# Patient Record
Sex: Female | Born: 1935 | Race: White | Hispanic: No | Smoking: Never smoker
Health system: Southern US, Community
[De-identification: ages and names within clinical notes are randomized; demographics above are authoritative.]

## PROBLEM LIST (undated history)

## (undated) ENCOUNTER — Emergency Department (HOSPITAL_COMMUNITY): Admission: EM | Payer: Medicare Other | Source: Home / Self Care

## (undated) DIAGNOSIS — D649 Anemia, unspecified: Secondary | ICD-10-CM

## (undated) DIAGNOSIS — K589 Irritable bowel syndrome without diarrhea: Secondary | ICD-10-CM

## (undated) DIAGNOSIS — R1032 Left lower quadrant pain: Secondary | ICD-10-CM

## (undated) DIAGNOSIS — J189 Pneumonia, unspecified organism: Secondary | ICD-10-CM

## (undated) DIAGNOSIS — IMO0001 Reserved for inherently not codable concepts without codable children: Secondary | ICD-10-CM

## (undated) DIAGNOSIS — I509 Heart failure, unspecified: Secondary | ICD-10-CM

## (undated) DIAGNOSIS — I10 Essential (primary) hypertension: Secondary | ICD-10-CM

## (undated) DIAGNOSIS — R0602 Shortness of breath: Secondary | ICD-10-CM

## (undated) DIAGNOSIS — I739 Peripheral vascular disease, unspecified: Secondary | ICD-10-CM

## (undated) DIAGNOSIS — K5730 Diverticulosis of large intestine without perforation or abscess without bleeding: Secondary | ICD-10-CM

## (undated) DIAGNOSIS — F419 Anxiety disorder, unspecified: Secondary | ICD-10-CM

## (undated) DIAGNOSIS — N390 Urinary tract infection, site not specified: Secondary | ICD-10-CM

## (undated) DIAGNOSIS — M1990 Unspecified osteoarthritis, unspecified site: Secondary | ICD-10-CM

## (undated) DIAGNOSIS — Z85038 Personal history of other malignant neoplasm of large intestine: Secondary | ICD-10-CM

## (undated) DIAGNOSIS — I2510 Atherosclerotic heart disease of native coronary artery without angina pectoris: Secondary | ICD-10-CM

## (undated) DIAGNOSIS — Z5189 Encounter for other specified aftercare: Secondary | ICD-10-CM

## (undated) DIAGNOSIS — K219 Gastro-esophageal reflux disease without esophagitis: Secondary | ICD-10-CM

## (undated) DIAGNOSIS — I209 Angina pectoris, unspecified: Secondary | ICD-10-CM

## (undated) DIAGNOSIS — E785 Hyperlipidemia, unspecified: Secondary | ICD-10-CM

## (undated) DIAGNOSIS — C801 Malignant (primary) neoplasm, unspecified: Secondary | ICD-10-CM

## (undated) HISTORY — DX: Essential (primary) hypertension: I10

## (undated) HISTORY — PX: APPENDECTOMY: SHX54

## (undated) HISTORY — DX: Hyperlipidemia, unspecified: E78.5

## (undated) HISTORY — DX: Irritable bowel syndrome, unspecified: K58.9

## (undated) HISTORY — PX: BILATERAL SALPINGOOPHORECTOMY: SHX1223

## (undated) HISTORY — DX: Left lower quadrant pain: R10.32

## (undated) HISTORY — DX: Diverticulosis of large intestine without perforation or abscess without bleeding: K57.30

## (undated) HISTORY — DX: Pneumonia, unspecified organism: J18.9

## (undated) HISTORY — DX: Atherosclerotic heart disease of native coronary artery without angina pectoris: I25.10

## (undated) HISTORY — DX: Personal history of other malignant neoplasm of large intestine: Z85.038

## (undated) HISTORY — DX: Urinary tract infection, site not specified: N39.0

## (undated) HISTORY — PX: BREAST SURGERY: SHX581

## (undated) HISTORY — PX: COLON SURGERY: SHX602

---

## 1987-04-12 HISTORY — PX: ANGIOPLASTY: SHX39

## 1989-04-11 HISTORY — PX: HEMICOLECTOMY: SHX854

## 1998-01-27 ENCOUNTER — Other Ambulatory Visit: Admission: RE | Admit: 1998-01-27 | Discharge: 1998-01-27 | Payer: Self-pay | Admitting: Oncology

## 1999-03-30 ENCOUNTER — Encounter: Payer: Self-pay | Admitting: Oncology

## 1999-03-30 ENCOUNTER — Encounter: Admission: RE | Admit: 1999-03-30 | Discharge: 1999-03-30 | Payer: Self-pay | Admitting: Oncology

## 1999-08-19 ENCOUNTER — Other Ambulatory Visit: Admission: RE | Admit: 1999-08-19 | Discharge: 1999-08-19 | Payer: Self-pay | Admitting: Internal Medicine

## 1999-08-19 ENCOUNTER — Encounter (INDEPENDENT_AMBULATORY_CARE_PROVIDER_SITE_OTHER): Payer: Self-pay | Admitting: Specialist

## 1999-11-18 ENCOUNTER — Encounter: Admission: RE | Admit: 1999-11-18 | Discharge: 1999-11-18 | Payer: Self-pay | Admitting: Otolaryngology

## 1999-11-18 ENCOUNTER — Encounter: Payer: Self-pay | Admitting: Otolaryngology

## 2000-02-01 ENCOUNTER — Other Ambulatory Visit: Admission: RE | Admit: 2000-02-01 | Discharge: 2000-02-01 | Payer: Self-pay | Admitting: Oncology

## 2000-03-31 ENCOUNTER — Encounter: Admission: RE | Admit: 2000-03-31 | Discharge: 2000-03-31 | Payer: Self-pay | Admitting: Oncology

## 2000-03-31 ENCOUNTER — Encounter: Payer: Self-pay | Admitting: Oncology

## 2001-03-29 ENCOUNTER — Encounter: Payer: Self-pay | Admitting: Oncology

## 2001-03-29 ENCOUNTER — Encounter: Admission: RE | Admit: 2001-03-29 | Discharge: 2001-03-29 | Payer: Self-pay | Admitting: Oncology

## 2002-06-20 ENCOUNTER — Other Ambulatory Visit: Admission: RE | Admit: 2002-06-20 | Discharge: 2002-06-20 | Payer: Self-pay | Admitting: *Deleted

## 2003-03-28 ENCOUNTER — Encounter: Admission: RE | Admit: 2003-03-28 | Discharge: 2003-03-28 | Payer: Self-pay | Admitting: Internal Medicine

## 2004-03-17 ENCOUNTER — Ambulatory Visit: Payer: Self-pay | Admitting: Internal Medicine

## 2004-03-19 ENCOUNTER — Ambulatory Visit: Payer: Self-pay | Admitting: Internal Medicine

## 2004-09-29 ENCOUNTER — Ambulatory Visit: Payer: Self-pay | Admitting: Internal Medicine

## 2004-10-28 ENCOUNTER — Ambulatory Visit: Payer: Self-pay | Admitting: Internal Medicine

## 2004-11-24 ENCOUNTER — Ambulatory Visit: Payer: Self-pay | Admitting: Internal Medicine

## 2004-11-24 LAB — HM COLONOSCOPY

## 2005-04-06 ENCOUNTER — Encounter: Admission: RE | Admit: 2005-04-06 | Discharge: 2005-04-06 | Payer: Self-pay | Admitting: Internal Medicine

## 2005-11-21 ENCOUNTER — Ambulatory Visit: Payer: Self-pay | Admitting: Internal Medicine

## 2005-12-29 ENCOUNTER — Ambulatory Visit: Payer: Self-pay | Admitting: Internal Medicine

## 2006-01-17 ENCOUNTER — Ambulatory Visit (HOSPITAL_COMMUNITY): Admission: RE | Admit: 2006-01-17 | Discharge: 2006-01-17 | Payer: Self-pay | Admitting: Orthopedic Surgery

## 2007-02-12 ENCOUNTER — Encounter: Payer: Self-pay | Admitting: Internal Medicine

## 2007-02-16 ENCOUNTER — Telehealth: Payer: Self-pay | Admitting: Internal Medicine

## 2007-03-19 ENCOUNTER — Encounter: Payer: Self-pay | Admitting: Internal Medicine

## 2007-04-06 ENCOUNTER — Encounter: Payer: Self-pay | Admitting: Internal Medicine

## 2007-04-06 ENCOUNTER — Encounter: Admission: RE | Admit: 2007-04-06 | Discharge: 2007-04-06 | Payer: Self-pay | Admitting: Internal Medicine

## 2007-04-06 LAB — HM MAMMOGRAPHY: HM Mammogram: NORMAL

## 2007-04-19 ENCOUNTER — Telehealth (INDEPENDENT_AMBULATORY_CARE_PROVIDER_SITE_OTHER): Payer: Self-pay | Admitting: *Deleted

## 2007-05-30 ENCOUNTER — Encounter: Payer: Self-pay | Admitting: Internal Medicine

## 2007-05-30 DIAGNOSIS — I10 Essential (primary) hypertension: Secondary | ICD-10-CM | POA: Insufficient documentation

## 2007-05-30 DIAGNOSIS — Z85038 Personal history of other malignant neoplasm of large intestine: Secondary | ICD-10-CM | POA: Insufficient documentation

## 2007-05-30 DIAGNOSIS — I2510 Atherosclerotic heart disease of native coronary artery without angina pectoris: Secondary | ICD-10-CM | POA: Insufficient documentation

## 2007-05-30 DIAGNOSIS — J309 Allergic rhinitis, unspecified: Secondary | ICD-10-CM | POA: Insufficient documentation

## 2007-05-30 DIAGNOSIS — J189 Pneumonia, unspecified organism: Secondary | ICD-10-CM

## 2007-05-30 DIAGNOSIS — E785 Hyperlipidemia, unspecified: Secondary | ICD-10-CM

## 2007-05-31 ENCOUNTER — Ambulatory Visit: Payer: Self-pay | Admitting: Internal Medicine

## 2008-04-29 ENCOUNTER — Encounter: Payer: Self-pay | Admitting: Internal Medicine

## 2008-05-13 ENCOUNTER — Telehealth: Payer: Self-pay | Admitting: Internal Medicine

## 2008-07-18 ENCOUNTER — Encounter: Payer: Self-pay | Admitting: Internal Medicine

## 2008-08-04 ENCOUNTER — Telehealth: Payer: Self-pay | Admitting: Internal Medicine

## 2008-08-05 ENCOUNTER — Ambulatory Visit: Payer: Self-pay | Admitting: Internal Medicine

## 2008-08-05 DIAGNOSIS — K589 Irritable bowel syndrome without diarrhea: Secondary | ICD-10-CM

## 2008-08-05 DIAGNOSIS — K5730 Diverticulosis of large intestine without perforation or abscess without bleeding: Secondary | ICD-10-CM | POA: Insufficient documentation

## 2008-08-06 LAB — CONVERTED CEMR LAB
Alkaline Phosphatase: 85 units/L (ref 39–117)
BUN: 12 mg/dL (ref 6–23)
Basophils Relative: 0.1 % (ref 0.0–3.0)
Calcium: 9.7 mg/dL (ref 8.4–10.5)
Creatinine, Ser: 1.1 mg/dL (ref 0.4–1.2)
Eosinophils Absolute: 0 10*3/uL (ref 0.0–0.7)
GFR calc non Af Amer: 50.08 mL/min (ref >60)
HCT: 34.4 % — ABNORMAL LOW (ref 36.0–46.0)
Hemoglobin: 12 g/dL (ref 12.0–15.0)
Monocytes Absolute: 0.5 10*3/uL (ref 0.1–1.0)
Monocytes Relative: 7 % (ref 3.0–12.0)
Neutro Abs: 5.7 10*3/uL (ref 1.4–7.7)
Neutrophils Relative %: 75.1 % (ref 43.0–77.0)
RBC: 3.77 M/uL — ABNORMAL LOW (ref 3.87–5.11)
Total Bilirubin: 1 mg/dL (ref 0.3–1.2)
Total Protein: 7.4 g/dL (ref 6.0–8.3)

## 2008-08-14 ENCOUNTER — Telehealth: Payer: Self-pay | Admitting: Internal Medicine

## 2008-10-07 ENCOUNTER — Telehealth: Payer: Self-pay | Admitting: Internal Medicine

## 2008-10-15 ENCOUNTER — Telehealth: Payer: Self-pay | Admitting: Internal Medicine

## 2008-10-16 ENCOUNTER — Telehealth: Payer: Self-pay | Admitting: Family Medicine

## 2008-10-16 ENCOUNTER — Telehealth: Payer: Self-pay | Admitting: Internal Medicine

## 2008-10-16 ENCOUNTER — Ambulatory Visit: Payer: Self-pay | Admitting: Internal Medicine

## 2008-10-16 DIAGNOSIS — N390 Urinary tract infection, site not specified: Secondary | ICD-10-CM | POA: Insufficient documentation

## 2008-10-16 LAB — CONVERTED CEMR LAB
Bilirubin Urine: NEGATIVE
Eosinophils Relative: 0.1 % (ref 0.0–5.0)
HCT: 33.5 % — ABNORMAL LOW (ref 36.0–46.0)
Hemoglobin: 11.5 g/dL — ABNORMAL LOW (ref 12.0–15.0)
Lymphs Abs: 1.3 10*3/uL (ref 0.7–4.0)
Monocytes Absolute: 0.3 10*3/uL (ref 0.1–1.0)
Monocytes Relative: 1.7 % — ABNORMAL LOW (ref 3.0–12.0)
Neutro Abs: 17.9 10*3/uL — ABNORMAL HIGH (ref 1.4–7.7)
Nitrite: POSITIVE
Total Protein, Urine: 100 mg/dL
Urobilinogen, UA: 1 (ref 0.0–1.0)

## 2008-10-17 ENCOUNTER — Encounter: Payer: Self-pay | Admitting: Internal Medicine

## 2008-10-17 ENCOUNTER — Telehealth: Payer: Self-pay | Admitting: Family Medicine

## 2008-10-18 ENCOUNTER — Ambulatory Visit: Payer: Self-pay | Admitting: Internal Medicine

## 2008-10-18 ENCOUNTER — Inpatient Hospital Stay (HOSPITAL_COMMUNITY): Admission: EM | Admit: 2008-10-18 | Discharge: 2008-10-20 | Payer: Self-pay | Admitting: Emergency Medicine

## 2008-10-21 ENCOUNTER — Telehealth: Payer: Self-pay | Admitting: Internal Medicine

## 2008-10-21 ENCOUNTER — Telehealth (INDEPENDENT_AMBULATORY_CARE_PROVIDER_SITE_OTHER): Payer: Self-pay | Admitting: *Deleted

## 2008-10-23 ENCOUNTER — Ambulatory Visit: Payer: Self-pay | Admitting: Internal Medicine

## 2008-10-31 ENCOUNTER — Telehealth: Payer: Self-pay | Admitting: Internal Medicine

## 2008-12-10 ENCOUNTER — Inpatient Hospital Stay (HOSPITAL_COMMUNITY): Admission: EM | Admit: 2008-12-10 | Discharge: 2008-12-15 | Payer: Self-pay | Admitting: Emergency Medicine

## 2008-12-18 ENCOUNTER — Telehealth: Payer: Self-pay | Admitting: Internal Medicine

## 2008-12-22 ENCOUNTER — Telehealth: Payer: Self-pay | Admitting: Internal Medicine

## 2009-01-02 ENCOUNTER — Telehealth: Payer: Self-pay | Admitting: Internal Medicine

## 2009-01-06 ENCOUNTER — Telehealth: Payer: Self-pay | Admitting: Internal Medicine

## 2009-01-23 ENCOUNTER — Encounter: Payer: Self-pay | Admitting: Internal Medicine

## 2009-01-27 ENCOUNTER — Telehealth: Payer: Self-pay | Admitting: Internal Medicine

## 2009-01-29 ENCOUNTER — Telehealth: Payer: Self-pay | Admitting: Internal Medicine

## 2009-02-03 ENCOUNTER — Telehealth: Payer: Self-pay | Admitting: Internal Medicine

## 2009-02-24 ENCOUNTER — Telehealth: Payer: Self-pay | Admitting: Internal Medicine

## 2009-03-03 ENCOUNTER — Telehealth: Payer: Self-pay | Admitting: Internal Medicine

## 2009-03-09 ENCOUNTER — Telehealth: Payer: Self-pay | Admitting: Internal Medicine

## 2009-03-10 ENCOUNTER — Encounter: Payer: Self-pay | Admitting: Internal Medicine

## 2009-06-05 ENCOUNTER — Telehealth: Payer: Self-pay | Admitting: Internal Medicine

## 2009-06-17 ENCOUNTER — Telehealth: Payer: Self-pay | Admitting: Internal Medicine

## 2009-06-18 ENCOUNTER — Telehealth: Payer: Self-pay | Admitting: Internal Medicine

## 2009-06-19 ENCOUNTER — Telehealth: Payer: Self-pay | Admitting: Internal Medicine

## 2009-06-22 ENCOUNTER — Telehealth: Payer: Self-pay | Admitting: Internal Medicine

## 2009-07-01 ENCOUNTER — Telehealth: Payer: Self-pay | Admitting: Internal Medicine

## 2009-07-15 ENCOUNTER — Telehealth: Payer: Self-pay | Admitting: Internal Medicine

## 2009-07-27 ENCOUNTER — Telehealth: Payer: Self-pay | Admitting: Internal Medicine

## 2009-07-29 ENCOUNTER — Telehealth: Payer: Self-pay | Admitting: Internal Medicine

## 2009-08-12 ENCOUNTER — Telehealth: Payer: Self-pay | Admitting: Internal Medicine

## 2009-08-27 ENCOUNTER — Telehealth: Payer: Self-pay | Admitting: Internal Medicine

## 2009-09-02 ENCOUNTER — Telehealth: Payer: Self-pay | Admitting: Internal Medicine

## 2009-09-03 ENCOUNTER — Telehealth: Payer: Self-pay | Admitting: Internal Medicine

## 2009-09-08 ENCOUNTER — Telehealth: Payer: Self-pay | Admitting: Internal Medicine

## 2009-09-30 ENCOUNTER — Encounter: Payer: Self-pay | Admitting: Internal Medicine

## 2009-10-29 ENCOUNTER — Telehealth: Payer: Self-pay | Admitting: Internal Medicine

## 2009-11-09 ENCOUNTER — Encounter (INDEPENDENT_AMBULATORY_CARE_PROVIDER_SITE_OTHER): Payer: Self-pay | Admitting: *Deleted

## 2009-12-10 HISTORY — PX: ORIF HIP FRACTURE: SHX2125

## 2010-03-17 ENCOUNTER — Telehealth: Payer: Self-pay | Admitting: Internal Medicine

## 2010-03-22 ENCOUNTER — Telehealth: Payer: Self-pay | Admitting: Internal Medicine

## 2010-03-25 ENCOUNTER — Telehealth: Payer: Self-pay | Admitting: Internal Medicine

## 2010-04-08 ENCOUNTER — Ambulatory Visit
Admission: RE | Admit: 2010-04-08 | Discharge: 2010-04-08 | Payer: Self-pay | Source: Home / Self Care | Attending: Internal Medicine | Admitting: Internal Medicine

## 2010-04-08 DIAGNOSIS — R112 Nausea with vomiting, unspecified: Secondary | ICD-10-CM | POA: Insufficient documentation

## 2010-04-08 LAB — CONVERTED CEMR LAB
Glucose, Urine, Semiquant: NEGATIVE
Nitrite: NEGATIVE
Protein, U semiquant: NEGATIVE

## 2010-04-26 ENCOUNTER — Telehealth: Payer: Self-pay | Admitting: Internal Medicine

## 2010-05-02 ENCOUNTER — Encounter: Payer: Self-pay | Admitting: Internal Medicine

## 2010-05-03 ENCOUNTER — Telehealth: Payer: Self-pay | Admitting: Internal Medicine

## 2010-05-06 ENCOUNTER — Telehealth: Payer: Self-pay | Admitting: Internal Medicine

## 2010-05-11 NOTE — Progress Notes (Signed)
Summary: Nexium samples   Phone Note Call from Patient Call back at Home Phone [phone]   Caller: Patient Call For: Dr. Leone Payor Reason for Call: Talk to Nurse Summary of Call: would like to pick up samples of Nexium on Monday Initial call taken by: Vallarie Mare,  June 19, 2009 9:04 AM  Follow-up for Phone Call        I spoke to pt and advised her we do not have any nexium samples.  Pt appreciative of me looking and calling her back. Follow-up by: Francee Piccolo CMA Duncan Dull),  June 19, 2009 9:15 AM

## 2010-05-11 NOTE — Progress Notes (Signed)
Summary: SAMPLES  Phone Note Call from Patient   Summary of Call: Patient is requesting a call.  Initial call taken by: Lamar Sprinkles, CMA,  July 29, 2009 10:40 AM  Follow-up for Phone Call        Pt informed to pick up Follow-up by: Lamar Sprinkles, CMA,  July 29, 2009 1:22 PM    Prescriptions: NEXIUM 40 MG CPDR (ESOMEPRAZOLE MAGNESIUM) Take 1 capsule by mouth once a day  #30 x 0   Entered by:   Lamar Sprinkles, CMA   Authorized by:   Jacques Navy MD   Signed by:   Lamar Sprinkles, CMA on 07/29/2009   Method used:   Samples Given   RxID:   [id number]

## 2010-05-11 NOTE — Progress Notes (Signed)
Summary: Medication Refill  Phone Note Refill Request Message from:  Fax from Pharmacy on Sep 08, 2009 1:45 PM  Refills Requested: Medication #1:  ATIVAN 0.5 MG TABS 1 at bedtime as needed  Medication #2:  TRIAMCINOLONE ACETONIDE 0.1 % CREA apply two times a day.   Dosage confirmed as above?Dosage Confirmed   Brand Name Necessary? No   Supply Requested: 1 month   Notes: Apply  twice a day to arms please Advise refill   Method Requested: Telephone to Pharmacy Next Appointment Scheduled: None Initial call taken by: Ami Bullins CMA,  Sep 08, 2009 1:46 PM  Follow-up for Phone Call        OK for refills x 2 months. Will need OV - last July '10 Follow-up by: Jacques Navy MD,  Sep 08, 2009 6:04 PM    Prescriptions: ATIVAN 0.5 MG TABS (LORAZEPAM) 1 at bedtime as needed  #30 x 1   Entered by:   Lamar Sprinkles, CMA   Authorized by:   Jacques Navy MD   Signed by:   Lamar Sprinkles, CMA on 09/09/2009   Method used:   Telephoned to ...       [facility] (retail)       [address]       [place], [place]  [address]       Ph: [phone]       Fax: [phone]   RxID:   [id number] TRIAMCINOLONE ACETONIDE 0.1 % CREA (TRIAMCINOLONE ACETONIDE) apply two times a day  #120 x 1   Entered by:   Lamar Sprinkles, CMA   Authorized by:   Jacques Navy MD   Signed by:   Lamar Sprinkles, CMA on 09/09/2009   Method used:   Telephoned to ...       [facility] (retail)       [address]       [place], [place]  [address]       Ph: [phone]       Fax: [phone]   RxID:   [id number]

## 2010-05-11 NOTE — Progress Notes (Signed)
Summary: SAMPLES  Phone Note Call from Patient   Summary of Call: Patient is requesting samples of nexium.  Initial call taken by: Lamar Sprinkles, CMA,  Sep 03, 2009 11:28 AM  Follow-up for Phone Call        Patient notified and will pick up.Marland KitchenMarland KitchenAlvy Beal Archie CMA  Sep 03, 2009 11:33 AM

## 2010-05-11 NOTE — Letter (Signed)
Summary: [facility]   Imported By: Sherian Rein 10/07/2009 09:35:13  _____________________________________________________________________  External Attachment:    Type:   Image     Comment:   External Document

## 2010-05-11 NOTE — Progress Notes (Signed)
Summary: Sooner appt.   Phone Note Call from Patient Call back at [facility] Phone [phone]   Caller: Patient Call For: Dr. Leone Payor Reason for Call: Talk to Nurse Summary of Call: Pt has an appt. on 07-06-09 and wants a sooner appt. She said she has "alot of fluid backup" Initial call taken by: Karna Christmas,  June 22, 2009 8:08 AM  Follow-up for Phone Call        Patient  can't come to her appointment on 07-06-09 at 2:30, due to a ride, she wants to reschedule for an am.  I explained that Dr Leone Payor doesn't have any am appointments until 07-20-09.  She will continue to try and find a ride and call back if she wants to reschedule. Follow-up by: Darcey Nora RN, CGRN,  June 22, 2009 8:37 AM

## 2010-05-11 NOTE — Progress Notes (Signed)
Summary: Triamcinolone Cream Refill  Phone Note Refill Request Message from:  Fax from Pharmacy on Sep 02, 2009 2:21 PM  Refills Requested: Medication #1:  TRIAMCINOLONE ACETON 0.1% TOPICAL CREAM   Last Refilled: 09/19/2006 QTY 120 GM APPLY TWO TIMES A DAY TO [facility]   Method Requested: Electronic Next Appointment Scheduled:  NONE Initial call taken by: Glendell Docker CMA,  Sep 02, 2009 2:28 PM  Follow-up for Phone Call        OK to refill x 2  Follow-up by: Jacques Navy MD,  Sep 03, 2009 5:58 PM    New/Updated Medications: TRIAMCINOLONE ACETONIDE 0.1 % CREA (TRIAMCINOLONE ACETONIDE) apply two times a day Prescriptions: TRIAMCINOLONE ACETONIDE 0.1 % CREA (TRIAMCINOLONE ACETONIDE) apply two times a day  #120 x 2   Entered by:   Lamar Sprinkles, CMA   Authorized by:   Jacques Navy MD   Signed by:   Lamar Sprinkles, CMA on 09/03/2009   Method used:   Faxed to ...       [facility] (retail)       [address]       [place], [place]  [address]       Ph: [phone]       Fax: [phone]   RxID:   [id number]

## 2010-05-11 NOTE — Progress Notes (Signed)
Summary: Nexium samples  Phone Note Call from Patient Call back at Home Phone [phone]   Caller: Patient Call For: Leone Payor Reason for Call: Talk to Nurse Summary of Call: Patient wants Nexium samples Initial call taken by: Tawni Levy,  July 27, 2009 10:48 AM  Follow-up for Phone Call        [facility] from pt.  Advised pt we do not have any samples.  I also explained to pt again that we do not get a lot of samples and those are given to our patients in the office and therefore frequently used rather quickly.  Pt is upset we do not have samples for "old patients".  I explained to her that we do have samples for established patients, but we are not receiving enough samples to keep a supply in our office and frequently run out. Follow-up by: Francee Piccolo CMA Duncan Dull),  July 29, 2009 9:41 AM

## 2010-05-11 NOTE — Progress Notes (Signed)
  Phone Note Refill Request   Refills Requested: Medication #1:  NEXIUM 40 MG CPDR Take 1 capsule by mouth once a day Initial call taken by: Rock Nephew CMA,  Aug 27, 2009 11:00 AM    Prescriptions: NEXIUM 40 MG CPDR (ESOMEPRAZOLE MAGNESIUM) Take 1 capsule by mouth once a day  #60 x 5   Entered by:   Rock Nephew CMA   Authorized by:   Jacques Navy MD   Signed by:   Rock Nephew CMA on 08/27/2009   Method used:   Faxed to ...       [facility] (retail)       [address]       [place], [place]  [address]       Ph: [phone]       Fax: [phone]   RxID:   [id number]

## 2010-05-11 NOTE — Progress Notes (Signed)
Summary: Nexium samples   Phone Note Call from Patient Call back at Home Phone [phone]   Caller: Patient Call For: Dr. Leone Payor Reason for Call: Talk to Nurse Summary of Call: Pt wants to know if we have any samples of Nexium? Initial call taken by: Karna Christmas,  June 05, 2009 9:34 AM  Follow-up for Phone Call        advised pt no samples and next shipment will not arrive for another 10-14 days.  Advised she may call back in about 2 weeks to see if we have samples.  Pt agreeable. Follow-up by: Francee Piccolo CMA Duncan Dull),  June 05, 2009 4:40 PM

## 2010-05-11 NOTE — Letter (Signed)
Summary: Colonoscopy-Changed to Office Visit Letter  [facility]  [address] [place], [place] [address]   Phone: [phone]  Fax: [phone]      November 09, 2009 MRN: [id number]   [facility] [address] [place], [place]  [address]   Dear Ms. [facility],   According to our records, it is time for you to schedule a Colonoscopy. However, after reviewing your medical record, I feel that an office visit would be most appropriate to more completely evaluate you and determine your need for a repeat procedure.  Please call [phone] (option #2) at your convenience to schedule an office visit. If you have any questions, concerns, or feel that this letter is in error, we would appreciate your call.   Sincerely,   Iva Boop, M.D.  [facility]astro[facility] [phone]

## 2010-05-11 NOTE — Progress Notes (Signed)
  Phone Note Refill Request Message from:  Fax from Pharmacy on March 17, 2010 1:30 PM  Refills Requested: Medication #1:  NEXIUM 40 MG CPDR Take 1 capsule by mouth once a day Initial call taken by: Ami Bullins CMA,  March 17, 2010 1:30 PM    Prescriptions: NEXIUM 40 MG CPDR (ESOMEPRAZOLE MAGNESIUM) Take 1 capsule by mouth once a day  #60 x 5   Entered by:   Ami Bullins CMA   Authorized by:   Jacques Navy MD   Signed by:   Bill Salinas CMA on 03/17/2010   Method used:   Faxed to ...       [facility] (retail)       [address]       [place], [place]  [address]       Ph: [phone]       Fax: [phone]   RxID:   [id number]

## 2010-05-11 NOTE — Progress Notes (Signed)
Summary: REFILL -Lorazepam  Phone Note Refill Request   Refills Requested: Medication #1:  ATIVAN 0.5 MG TABS 1 at bedtime as needed Initial call taken by: Lamar Sprinkles, CMA,  October 29, 2009 10:27 AM  Follow-up for Phone Call        ok x 5 Follow-up by: Jacques Navy MD,  October 29, 2009 4:15 PM    Prescriptions: ATIVAN 0.5 MG TABS (LORAZEPAM) 1 at bedtime as needed  #30 x 5   Entered by:   Lamar Sprinkles, CMA   Authorized by:   Jacques Navy MD   Signed by:   Lamar Sprinkles, CMA on 10/29/2009   Method used:   Telephoned to ...       [facility] (retail)       [address]       [place], [place]  [address]       Ph: [phone]       Fax: [phone]   RxID:   [id number]

## 2010-05-11 NOTE — Progress Notes (Signed)
Summary: Promethazine refill  Phone Note Call from Patient Call back at Home Phone [phone]   Caller: Patient Call For: Dr. Leone Payor Reason for Call: Talk to Nurse Summary of Call: pharmacy needs auth for Promethazine... [facility], [phone] Initial call taken by: Vallarie Mare,  July 15, 2009 1:31 PM  Follow-up for Phone Call        I spoke to pt.  She states that she is not having an increase in her nausea. She will only have nausea 2-3 times every 2 months or so.  She "just thought I would need in my medicine cabinet".  Is it OK to refill?  Last refilled on 12/19/08 #30x0. Follow-up by: Francee Piccolo CMA Duncan Dull),  July 15, 2009 2:41 PM  Additional Follow-up for Phone Call Additional follow up Details #1::        yes same sig and # I did it    Prescriptions: PROMETHAZINE HCL 25 MG TABS (PROMETHAZINE HCL) 1/2- 1 by mouth q 8 hours as needed nausea  #30 x 0   Entered and Authorized by:   Iva Boop MD, [facility]   Signed by:   Iva Boop MD, FACG on 07/15/2009   Method used:   Printed then faxed to ...       [facility] (retail)       [address]       [place], [place]  [address]       Ph: [phone]       Fax: [phone]   RxID:   [id number]

## 2010-05-11 NOTE — Progress Notes (Signed)
Summary: Real important   Phone Note Call from Patient Call back at Home Phone [phone]   Call For: Dr Leone Payor Reason for Call: Talk to Nurse Summary of Call: Really important needs to tak to nurse now. Initial call taken by: Leanor Kail [facility],  June 18, 2009 8:05 AM  Follow-up for Phone Call        patient has decided she does want an appointment with Dr Leone Payor she is scheduled for 07-06-09 2:30 Follow-up by: Darcey Nora RN, CGRN,  June 18, 2009 8:27 AM

## 2010-05-11 NOTE — Progress Notes (Signed)
Summary: Nexium samples  Phone Note Call from Patient Call back at Home Phone [phone]   Summary of Call: Patient was last seen 10/2008. Is it ok to provide Nexium samples? Initial call taken by: Lucious Groves,  Aug 12, 2009 1:14 PM  Follow-up for Phone Call        Can have them if we have them. If no samples may take omeprazole 40mg  by mouth q AM; #30, refill prn Follow-up by: Jacques Navy MD,  Aug 12, 2009 3:21 PM  Additional Follow-up for Phone Call Additional follow up Details #1::        tried to call home number several times. Number busy Additional Follow-up by: Ami Bullins CMA,  Aug 13, 2009 4:39 PM    Additional Follow-up for Phone Call Additional follow up Details #2::    pt informed, 30 day supply of samples in front cabinet for pt pick up Follow-up by: Margaret Pyle, CMA,  Aug 14, 2009 9:27 AM

## 2010-05-11 NOTE — Progress Notes (Signed)
Summary: Triage   Phone Note Call from Patient Call back at Home Phone [phone]   Caller: Patient Call For: Dr. Leone Payor Reason for Call: Talk to Nurse Summary of Call: Pt is swollen and feel like she is "retaining fluid" Initial call taken by: Karna Christmas,  June 17, 2009 9:02 AM  Follow-up for Phone Call         Pt. started having abdominal bloating 2 weeks ago and it has not gone away.Says abd. is soft and denies constipation. Says oral intake does not affect the bloating it has stayed the same. Had soft formed stool today.Asking for medication  for the bloating. Follow-up by: Teryl Lucy RN,  June 17, 2009 10:37 AM  Additional Follow-up for Phone Call Additional follow up Details #1::        She needs to see me or an extender about this before any Rx Additional Follow-up by: Iva Boop MD, Clementeen Graham,  June 17, 2009 1:45 PM    Additional Follow-up for Phone Call Additional follow up Details #2::    Patient notified that she will need an appointment , she declines to schedule and will call back if she is interested in scheduling.  She reports she has to see her cardiologist soon and will call back   Follow-up by: Darcey Nora RN, CGRN,  June 17, 2009 2:26 PM

## 2010-05-11 NOTE — Progress Notes (Signed)
Summary: Nexium samples   Phone Note Call from Patient Call back at Home Phone [phone]   Caller: Patient Call For: Dr. Leone Payor Reason for Call: Talk to Nurse Summary of Call: pt would like samples of Nexium Initial call taken by: Vallarie Mare,  July 01, 2009 12:07 PM  Follow-up for Phone Call        2nd call to pt.  Line busy. Francee Piccolo CMA Duncan Dull)  July 02, 2009 3:58 PM  notified pt we do not have any nexium samples. Follow-up by: Francee Piccolo CMA Duncan Dull),  July 06, 2009 2:01 PM

## 2010-05-13 NOTE — Progress Notes (Signed)
Summary: NEXIUM QTY ?  Phone Note Call from Patient   Summary of Call: Pt left vm @ 9:42am today. Wants to know why we didn't send rx for nexium in w/QTY #60. Need to call pt - is she taking med two times a day?  Initial call taken by: Lamar Sprinkles, CMA,  March 22, 2010 10:46 AM  Follow-up for Phone Call        Pt did not leave call back #. Hm # in system says changed to nonpublished #.  Follow-up by: Lamar Sprinkles, CMA,  March 22, 2010 12:01 PM  Additional Follow-up for Phone Call Additional follow up Details #1::        Called pharm - new phone # is [phone]. Called pt, she takes nexium 1 two times a day. Ok to update EMR and send in new rx?  Additional Follow-up by: Lamar Sprinkles, CMA,  March 23, 2010 12:12 PM    Additional Follow-up for Phone Call Additional follow up Details #2::    Patient not seen for a while. Reviewed office records and hospital d/c. Alway lists nexium as once a day. No endoscopy reports and no indication in Dr. Marvell Fuller notes that there is a reason fro twice a day dosing. It is generally a once a day medication.  OK to issue 60 for two times a day dosing until she can be seen in the office for a followup appointment either with me or Dr. Leone Payor.   Thanks Follow-up by: Jacques Navy MD,  March 23, 2010 12:44 PM  Additional Follow-up for Phone Call Additional follow up Details #3:: Details for Additional Follow-up Action Taken: ok we will have her conme in in January Iva Boop MD, [facility]  March 23, 2010 1:53 PM  Called and spoke with patient and tried to schedule her for REV in January. Patient states that she "does not trust Dr. Leone Payor." Patient states that all he does is bring her back in for visits and charge co-pays. Informed patient that we were trying to get her in based on Dr. Alvera Novel note. Patient states that she will use Dr. Debby Bud for all of her GI care. Patient wanted to make sure we called in quantitiy of 60 for her Nexium.  Again informed patient that we were trying to bring her in to take care of her medications and that we would not call in the Nexium. Dr. Debby Bud states that he refilled it with quantity of 60 until patient could be seen for visit with either Leone Payor or Dr. Debby Bud. Patient aware and states she will see Norins. Let patient know I would note this in her chart. Additional Follow-up by: Selinda Michaels RN,  March 23, 2010 3:16 PM  Conversation noted. Will not reschedule her without GI MD (me approval) though if she truly does not trust me we do not have an effective physician-patient relationship and would dismiss from practice. I think she is frustrated because we would ot dispense samples or prescribe medications without some ongoing follow-up.  will see what happens when she sees Dr. Debby Bud and remain available with these limitations for now. Iva Boop MD, [facility]  March 23, 2010 3:43 PM

## 2010-05-13 NOTE — Progress Notes (Signed)
Summary: REFILL  Phone Note Call from Patient Call back at Home Phone [phone]   Summary of Call: Patient is requesting a call - has a question.  Initial call taken by: Lamar Sprinkles, CMA,  May 03, 2010 2:34 PM  Follow-up for Phone Call        # busy.......................Marland KitchenLamar Sprinkles, CMA  May 03, 2010 5:18 PM   Pt req refill of mouthwash. It is chlorhexidine mouthwash. She uses it as needed for sore gums. OK?  Follow-up by: Lamar Sprinkles, CMA,  May 05, 2010 10:39 AM  Additional Follow-up for Phone Call Additional follow up Details #1::        ok to refill this - 1 pt, refill as needed, Additional Follow-up by: Jacques Navy MD,  May 05, 2010 12:59 PM    Additional Follow-up for Phone Call Additional follow up Details #2::    Pt informed  Follow-up by: Lamar Sprinkles, CMA,  May 05, 2010 1:15 PM  New/Updated Medications: CHLORHEXIDINE GLUCONATE  SOLN (CHLORHEXIDINE GLUCONATE) use mouthwash as directed for sore gums Prescriptions: CHLORHEXIDINE GLUCONATE  SOLN (CHLORHEXIDINE GLUCONATE) use mouthwash as directed for sore gums  #1 pt x 12   Entered by:   Lamar Sprinkles, CMA   Authorized by:   Jacques Navy MD   Signed by:   Lamar Sprinkles, CMA on 05/05/2010   Method used:   Electronically to        [facility] (retail)       [address]       [place], [place]  [address]       Ph: [phone]       Fax: [phone]   RxID:   [id number]

## 2010-05-13 NOTE — Progress Notes (Signed)
Summary: UTI?   Phone Note Call from Patient Call back at [facility] Phone [phone]   Summary of Call: Pt recently was treated for uti. She has blood in her urine again and req rx for antibiotic.  Initial call taken by: Lamar Sprinkles, CMA,  April 26, 2010 9:26 AM  Follow-up for Phone Call        If possible she needs to come by for U/A and urine culture before starting another round of anti biotics.   Rx - generic Septra DS two times a day x 7 days.  Follow-up by: Jacques Navy MD,  April 26, 2010 9:53 AM  Additional Follow-up for Phone Call Additional follow up Details #1::        # [phone] is not in service.............Marland KitchenLamar Sprinkles, CMA  April 26, 2010 2:07 PM   # changed in EMR. spoke w/pt, her car is not working and she has no ability to come in and her pharmacy delivers. Advised she come in if able, rx sent to pharmacy. Additional Follow-up by: Lamar Sprinkles, CMA,  April 26, 2010 5:55 PM    Additional Follow-up for Phone Call Additional follow up Details #2::    OK.  Follow-up by: Jacques Navy MD,  April 26, 2010 6:40 PM  New/Updated Medications: SEPTRA DS 800-160 MG TABS (SULFAMETHOXAZOLE-TRIMETHOPRIM) 1 two times a day x 7 days Prescriptions: SEPTRA DS 800-160 MG TABS (SULFAMETHOXAZOLE-TRIMETHOPRIM) 1 two times a day x 7 days  #14 x 0   Entered by:   Lamar Sprinkles, CMA   Authorized by:   Jacques Navy MD   Signed by:   Lamar Sprinkles, CMA on 04/26/2010   Method used:   Electronically to        [facility] (retail)       [address]       [place], [place]  [address]       Ph: [phone]       Fax: [phone]   RxID:   [id number]

## 2010-05-13 NOTE — Progress Notes (Signed)
Summary: samples  Phone Note Call from Patient   Summary of Call: Patient is requesting nexium samples. She is out and needs more to hold her until she can afford to purchase.  Initial call taken by: Lamar Sprinkles, CMA,  May 06, 2010 10:10 AM  Follow-up for Phone Call        pts samples up front unable to reach pt Follow-up by: Ami Bullins CMA,  May 06, 2010 2:06 PM  Additional Follow-up for Phone Call Additional follow up Details #1::        informed pt that samples are up front for pick up Additional Follow-up by: Ami Bullins CMA,  May 06, 2010 6:23 PM    Prescriptions: NEXIUM 40 MG CPDR (ESOMEPRAZOLE MAGNESIUM) Take 1 capsule by mouth once a day  #25 x 0   Entered by:   Ami Bullins CMA   Authorized by:   Jacques Navy MD   Signed by:   Bill Salinas CMA on 05/06/2010   Method used:   Samples Given   RxID:   [id number]

## 2010-05-13 NOTE — Assessment & Plan Note (Signed)
Summary: BLOOD IN URINE/ [facility]   Vital Signs:  Patient profile:   77 year old female Height:      65 inches Weight:      115 pounds BMI:     19.21 O2 Sat:      98 % on Room air Temp:     97.8 degrees F oral Pulse rate:   112 / minute BP sitting:   140 / 72  (left arm) Cuff size:   regular  Vitals Entered By: Bill Salinas CMA (April 08, 2010 8:58 AM)  O2 Flow:  Room air CC: pt here with c/o blood in her urine/ ab   Primary Care Provider:  Micheal Norins,MD  CC:  pt here with c/o blood in her urine/ ab.  History of Present Illness: Patient is seen acutely for hematuria. She reports some dysuria for 2-3 days. She did not have any increased urinary frequency, fever, chills, pain in the abdomen. She did notice blood in the bedpan last night.  Reviewed all records:  She has not been seen since July 2010.Marland Kitchen In the interval she had a broken hip with ORIF right hip September 2010 with follow up with Dr. Charlann Boxer in October. She did return home and had home health PT and made a good recovery..   She has been by followed by Dr. Lynnea Ferrier at [facility] with last visit in June '11. She was stable. 2D echo was to be done but no report available. She is planning to return to Dr. Jacinto Halim now at [facility].    Current Medications (verified): 1)  Fexofenadine Hcl 60 Mg Tabs (Fexofenadine Hcl) .... Take 1 Tablet By Mouth Twice A Day 2)  Nexium 40 Mg Cpdr (Esomeprazole Magnesium) .... Take 1 Capsule By Mouth Once A Day 3)  Xanax 1 Mg  Tabs (Alprazolam) .... As Directed As Needed 4)  Altace 10 Mg  Tabs (Ramipril) .... Take 1 Tablet By Mouth Once A Day 5)  Norvasc 10 Mg  Tabs (Amlodipine Besylate) .... Take 1 Tablet By Mouth Once A Day 6)  Pravachol 80 Mg  Tabs (Pravastatin Sodium) .... Take 1 Tablet By Mouth Once A Day 7)  Toprol Xl 100 Mg  Tb24 (Metoprolol Succinate) .... Take 1 Tablet By Mouth Once A Day 8)  Terazosin Hcl 1 Mg  Caps (Terazosin Hcl) .... Take 1 Tablet By Mouth Once A Day 9)  Bayer  Aspirin 325 Mg  Tabs (Aspirin) .... Take 1 Tablet By Mouth Once A Day 10)  Multivitamins   Tabs (Multiple Vitamin) .... Take 1 Tablet By Mouth Once A Day 11)  Nitroquick 0.4 Mg  Subl (Nitroglycerin) .... As Needed 12)  Dicyclomine Hcl 10 Mg  Caps (Dicyclomine Hcl) .Marland Kitchen.. 1-2 By Mouth Every 6 Hrs As Needed For Abdominal Pain 13)  Sulfamethoxazole-Trimethoprim 400-80 Mg/79ml Soln (Sulfamethoxazole-Trimethoprim) .Marland Kitchen.. 10 Cc By Mouth Three Times A Day X 7 Days. 14)  Promethazine Hcl 25 Mg Tabs (Promethazine Hcl) .... 1/2- 1 By Mouth Q 8 Hours As Needed Nausea 15)  Mirapex 0.125 Mg Tabs (Pramipexole Dihydrochloride) .Marland Kitchen.. 1 At Bedtime 16)  Ativan 0.5 Mg Tabs (Lorazepam) .Marland Kitchen.. 1 At Bedtime As Needed 17)  Triamcinolone Acetonide 0.1 % Crea (Triamcinolone Acetonide) .... Apply Two Times A Day  Allergies (verified): 1)  ! * Antihistamines  Past History:  Past Medical History: UTI (ICD-599.0) IBS (ICD-564.1) ABDOMINAL PAIN, LEFT LOWER QUADRANT, CHRONIC (ICD-789.04) CORONARY ARTERY DISEASE (ICD-414.00) Hx of PNEUMONIA (ICD-486) HYPERTENSION (ICD-401.9) HYPERLIPIDEMIA (ICD-272.4) DIVERTICULOSIS, COLON (ICD-562.10) COLON CANCER,  HX OF (ICD-V10.05) ALLERGIC RHINITIS (ICD-477.9)   Physician roster                 cardioloogy - Dr. Jacinto Halim                 GI                  Dr. Jeralene Huff -           Dr. Charlann Boxer  Past Surgical History: Colectomy (Right Hemicolectomy in 1991) Angioplasty/Stent x2 Appendectomy Bilateral salpingo-oopherectomy benign breast surgeries ORIF right hip September '11 Charlann Boxer)  Social History: widowed. Son lives with her and has multiple medical problems. She has family in [place]. [place] a nephew was recently murdered. Occupation: retired Patient has never smoked.  Alcohol Use - no Daily Caffeine Use  cokes Illicit Drug Use - no End of LIfe Care - patient hesitant to decide "I will decide at the time." thus she is a full code. She does not want to be a  vegeteble.  Review of Systems  The patient denies anorexia, fever, weight loss, weight gain, vision loss, hoarseness, chest pain, dyspnea on exertion, prolonged cough, hemoptysis, abdominal pain, and severe indigestion/heartburn.    Physical Exam  General:  elderly white woman, very thin, a little haggard Head:  normocephalic and atraumatic.   Eyes:  vision grossly intact, pupils equal, and pupils round.   Neck:  supple and full ROM.   Chest Wall:  no deformities.   Lungs:  normal respiratory effort, normal breath sounds, and no wheezes.   Heart:  normal rate and regular rhythm.   Abdomen:  soft and normal bowel sounds.   Pulses:  2+  radial Neurologic:  alert & oriented X3, cranial nerves II-XII intact, and gait normal.   Skin:  poor skin turgor, no lesions.  Psych:  Oriented X3, memory intact for recent and remote, and good eye contact.     Impression & Recommendations:  Problem # 1:  UTI (ICD-599.0) probable cystitis with hematuria but dip otherwise normal  Plan - cipro 250 mg two times a day x 5  The following medications were removed from the medication list:    Sulfamethoxazole-trimethoprim 400-80 Mg/42ml Soln (Sulfamethoxazole-trimethoprim) .Marland KitchenMarland KitchenMarland KitchenMarland Kitchen 10 cc by mouth three times a day x 7 days. Her updated medication list for this problem includes:    Ciprofloxacin Hcl 250 Mg Tabs (Ciprofloxacin hcl) .Marland Kitchen... 1 by mouth two times a day x 5 days for cystitis  Problem # 2:  IBS (ICD-564.1) patient does take dicyclomine on a as needed basis.   Problem # 3:  HYPERTENSION (ICD-401.9)  Her updated medication list for this problem includes:    Altace 10 Mg Tabs (Ramipril) .Marland Kitchen... Take 1 tablet by mouth once a day    Norvasc 10 Mg Tabs (Amlodipine besylate) .Marland Kitchen... Take 1 tablet by mouth once a day    Toprol Xl 100 Mg Tb24 (Metoprolol succinate) .Marland Kitchen... Take 1 tablet by mouth once a day    Terazosin Hcl 1 Mg Caps (Terazosin hcl) .Marland Kitchen... Take 1 tablet by mouth once a day  BP today:  140/72 Prior BP: 122/68 (10/23/2008)  Labs Reviewed: K+: 4.9 (08/05/2008) Creat: : 1.1 (08/05/2008)     Mild elevation today. Previously well controlled.   Pan - will continue present medication  Problem # 4:  End of Life Care lengthy discussion. Patient is ambivalent: she  states she does not want anything written down and she will decided about resuscitation and ventilation and heroic measures at the time. However, she does not want prolonged heroics and she does indicate she would not want resuscitation. However, for now she is informed that the default position is do everything - full Code. She is provided with packet on living will,  out of facility order and MOST.  Problem # 5:  NAUSEA AND VOMITING (ICD-787.01) Patient did develop increased abdominal pain with nausea and an episode of emesis while in the office. She was given a dose of nexium 40mg . Her chest was clear, heart sounds normal. She denied any chest pain or pressure or difficulty breathing. She felt better and was able to return home.   Complete Medication List: 1)  Nexium 40 Mg Cpdr (Esomeprazole magnesium) .... Take 1 capsule by mouth once a day 2)  Xanax 1 Mg Tabs (Alprazolam) .... As directed as needed 3)  Altace 10 Mg Tabs (Ramipril) .... Take 1 tablet by mouth once a day 4)  Norvasc 10 Mg Tabs (Amlodipine besylate) .... Take 1 tablet by mouth once a day 5)  Pravachol 80 Mg Tabs (Pravastatin sodium) .... Take 1 tablet by mouth once a day 6)  Toprol Xl 100 Mg Tb24 (Metoprolol succinate) .... Take 1 tablet by mouth once a day 7)  Terazosin Hcl 1 Mg Caps (Terazosin hcl) .... Take 1 tablet by mouth once a day 8)  Bayer Aspirin 325 Mg Tabs (Aspirin) .... Take 1 tablet by mouth once a day 9)  Multivitamins Tabs (Multiple vitamin) .... Take 1 tablet by mouth once a day 10)  Nitroquick 0.4 Mg Subl (Nitroglycerin) .... As needed 11)  Dicyclomine Hcl 10 Mg Caps (Dicyclomine hcl) .Marland Kitchen.. 1-2 by mouth every 6 hrs as needed for  abdominal pain 12)  Triamcinolone Acetonide 0.1 % Crea (Triamcinolone acetonide) .... Apply two times a day 13)  Norco 5-325 Mg Tabs (Hydrocodone-acetaminophen) .Marland Kitchen.. 1 by mouth q 6 hr as needed pain 14)  Ciprofloxacin Hcl 250 Mg Tabs (Ciprofloxacin hcl) .Marland Kitchen.. 1 by mouth two times a day x 5 days for cystitis  Patient Instructions: 1)  Bladder infection - will treat with antibiotics - cipro 250mg  two times a day for 5 days. 2)  For arthritis - will start hydrocodone every 6 hours as needed. 3)  Please review the attached medication list and compare to your home medications to be sure it is correct. 4)  End of life care - in the absence of an advanced directive at the time of a cardiac or respiratory arrest (heart stops, no breathing) you have said "I will decide at the time or my son will decide for me" therefore, by law everything will be done to resuscitate you. You have also said you do not want to be kept in a vegative state or have prolonged heroic care. Prescriptions: NORCO 5-325 MG TABS (HYDROCODONE-ACETAMINOPHEN) 1 by mouth q 6 hr as needed pain  #60 x 3   Entered and Authorized by:   Jacques Navy MD   Signed by:   Jacques Navy MD on 04/08/2010   Method used:   Print then Give to Patient   RxID:   [id number] CIPROFLOXACIN HCL 250 MG TABS (CIPROFLOXACIN HCL) 1 by mouth two times a day x 5 days for cystitis  #10 x 0   Entered and Authorized by:   Jacques Navy MD   Signed by:   Jacques Navy MD  on 04/08/2010   Method used:   Print then Give to Patient   RxID:   [id number]    Orders Added: 1)  Est. Patient Level IV [99214]    Laboratory Results   Urine Tests   Date/Time Reported: Ami Bullins CMA  April 08, 2010 9:07 AM   Routine Urinalysis   Color: straw Appearance: Clear Glucose: negative   (Normal Range: Negative) Bilirubin: negative   (Normal Range: Negative) Ketone: negative   (Normal Range: Negative) Spec. Gravity: 1.020   (Normal Range:  1.003-1.035) Blood: large   (Normal Range: Negative) pH: 5.0   (Normal Range: 5.0-8.0) Protein: negative   (Normal Range: Negative) Urobilinogen: 0.2   (Normal Range: 0-1) Nitrite: negative   (Normal Range: Negative) Leukocyte Esterace: negative   (Normal Range: Negative)

## 2010-05-13 NOTE — Progress Notes (Signed)
Summary: refill  Phone Note Refill Request Message from:  Patient on March 25, 2010 10:39 AM  Refills Requested: Medication #1:  ATIVAN 0.5 MG TABS 1 at bedtime as needed   Dosage confirmed as above?Dosage Confirmed   Supply Requested: 1 month   Last Refilled: 10/2009 Return call to  pt @ [phone] (# DC). called [phone] told wrong number  Is this ok to refill?   Method Requested: Telephone to Pharmacy Initial call taken by: Rock Nephew CMA,  March 25, 2010 10:39 AM Caller: [facility]  Follow-up for Phone Call        ok to refill x 5 Follow-up by: Jacques Navy MD,  March 25, 2010 12:54 PM    Prescriptions: ATIVAN 0.5 MG TABS (LORAZEPAM) 1 at bedtime as needed  #30 x 5   Entered by:   Ami Bullins CMA   Authorized by:   Jacques Navy MD   Signed by:   Bill Salinas CMA on 03/26/2010   Method used:   Telephoned to ...       [facility] (retail)       [address]       [place], [place]  [address]       Ph: [phone]       Fax: [phone]   RxID:   [id number]

## 2010-06-10 ENCOUNTER — Encounter: Payer: Self-pay | Admitting: Internal Medicine

## 2010-06-11 ENCOUNTER — Emergency Department (HOSPITAL_COMMUNITY)
Admission: EM | Admit: 2010-06-11 | Discharge: 2010-06-11 | Disposition: A | Discharge status: Home / Self Care | Payer: Medicare Other | Attending: Emergency Medicine | Admitting: Emergency Medicine

## 2010-06-11 ENCOUNTER — Emergency Department (HOSPITAL_COMMUNITY): Payer: Medicare Other

## 2010-06-11 DIAGNOSIS — I10 Essential (primary) hypertension: Secondary | ICD-10-CM | POA: Insufficient documentation

## 2010-06-11 DIAGNOSIS — W1809XA Striking against other object with subsequent fall, initial encounter: Secondary | ICD-10-CM | POA: Insufficient documentation

## 2010-06-11 DIAGNOSIS — I4891 Unspecified atrial fibrillation: Secondary | ICD-10-CM | POA: Insufficient documentation

## 2010-06-11 DIAGNOSIS — Z85038 Personal history of other malignant neoplasm of large intestine: Secondary | ICD-10-CM | POA: Insufficient documentation

## 2010-06-11 DIAGNOSIS — Z9889 Other specified postprocedural states: Secondary | ICD-10-CM | POA: Insufficient documentation

## 2010-06-11 DIAGNOSIS — S0990XA Unspecified injury of head, initial encounter: Secondary | ICD-10-CM | POA: Insufficient documentation

## 2010-06-11 DIAGNOSIS — S0100XA Unspecified open wound of scalp, initial encounter: Secondary | ICD-10-CM | POA: Insufficient documentation

## 2010-06-11 DIAGNOSIS — Z7982 Long term (current) use of aspirin: Secondary | ICD-10-CM | POA: Insufficient documentation

## 2010-06-11 DIAGNOSIS — R51 Headache: Secondary | ICD-10-CM | POA: Insufficient documentation

## 2010-06-11 DIAGNOSIS — Y929 Unspecified place or not applicable: Secondary | ICD-10-CM | POA: Insufficient documentation

## 2010-06-11 DIAGNOSIS — S0003XA Contusion of scalp, initial encounter: Secondary | ICD-10-CM | POA: Insufficient documentation

## 2010-06-11 DIAGNOSIS — K219 Gastro-esophageal reflux disease without esophagitis: Secondary | ICD-10-CM | POA: Insufficient documentation

## 2010-06-11 DIAGNOSIS — I252 Old myocardial infarction: Secondary | ICD-10-CM | POA: Insufficient documentation

## 2010-06-14 ENCOUNTER — Telehealth: Payer: Self-pay | Admitting: Internal Medicine

## 2010-06-14 ENCOUNTER — Encounter: Payer: Self-pay | Admitting: Internal Medicine

## 2010-06-18 ENCOUNTER — Ambulatory Visit (INDEPENDENT_AMBULATORY_CARE_PROVIDER_SITE_OTHER): Payer: Medicare Other | Admitting: Internal Medicine

## 2010-06-18 ENCOUNTER — Encounter: Payer: Self-pay | Admitting: Internal Medicine

## 2010-06-18 DIAGNOSIS — Z4802 Encounter for removal of sutures: Secondary | ICD-10-CM

## 2010-06-22 NOTE — Letter (Signed)
Summary: [facility]   Imported By: Sherian Rein 06/15/2010 14:21:17  _____________________________________________________________________  External Attachment:    Type:   Image     Comment:   External Document

## 2010-06-22 NOTE — Progress Notes (Signed)
Summary: Med Inquiry  Phone Note Call from Patient Call back at Home Phone [phone]   Caller: Patient Call For: Suture Removal Summary of Call: Pt called and stated that she was in the ED on Saturday 06/11/2010 where she had stiches put in her head. She was told to come in to our office on Friday 06/18/2010 to have her sutures removed. Please advise. Initial call taken by: Burnard Leigh [facility]),  June 14, 2010 9:29 AM  Follow-up for Phone Call        OK for suture removal Friday Follow-up by: Jacques Navy MD,  June 14, 2010 2:47 PM  Additional Follow-up for Phone Call Additional follow up Details #1::        pt already scheduled Additional Follow-up by: Lamar Sprinkles, CMA,  June 15, 2010 9:42 AM

## 2010-06-23 ENCOUNTER — Telehealth: Payer: Self-pay | Admitting: Internal Medicine

## 2010-06-29 NOTE — Assessment & Plan Note (Signed)
Summary: ER FU / Otila Back REMOVAL Natale Milch   Vital Signs:  Patient profile:   77 year old female Height:      65 inches Weight:      119 pounds BMI:     19.87 O2 Sat:      98 % on Room air Temp:     97.3 degrees F oral Pulse rate:   85 / minute BP sitting:   128 / 70  (left arm) Cuff size:   regular  Vitals Entered By: Bill Salinas CMA (June 18, 2010 11:00 AM)  O2 Flow:  Room air CC: pt here to have 3 staples removed from scalp/ ab   Primary Care Provider:  Micheal Delquan Poucher,MD  CC:  pt here to have 3 staples removed from scalp/ ab.  History of Present Illness: Patient fell and sustained laceration to her vertex scalp which was closed with 3 staples by ED staff. She presents for staple removal. She has done well: no headaches, confusion, slurred speech or any other neurologic symptoms.   Current Medications (verified): 1)  Nexium 40 Mg Cpdr (Esomeprazole Magnesium) .... Take 1 Capsule By Mouth Once A Day 2)  Xanax 1 Mg  Tabs (Alprazolam) .... As Directed As Needed 3)  Altace 10 Mg  Tabs (Ramipril) .... Take 1 Tablet By Mouth Once A Day 4)  Norvasc 10 Mg  Tabs (Amlodipine Besylate) .... Take 1 Tablet By Mouth Once A Day 5)  Pravachol 80 Mg  Tabs (Pravastatin Sodium) .... Take 1 Tablet By Mouth Once A Day 6)  Toprol Xl 100 Mg  Tb24 (Metoprolol Succinate) .... Take 1 Tablet By Mouth Once A Day 7)  Terazosin Hcl 1 Mg  Caps (Terazosin Hcl) .... Take 1 Tablet By Mouth Once A Day 8)  Bayer Aspirin 325 Mg  Tabs (Aspirin) .... Take 1 Tablet By Mouth Once A Day 9)  Multivitamins   Tabs (Multiple Vitamin) .... Take 1 Tablet By Mouth Once A Day 10)  Nitroquick 0.4 Mg  Subl (Nitroglycerin) .... As Needed 11)  Dicyclomine Hcl 10 Mg  Caps (Dicyclomine Hcl) .Marland Kitchen.. 1-2 By Mouth Every 6 Hrs As Needed For Abdominal Pain 12)  Triamcinolone Acetonide 0.1 % Crea (Triamcinolone Acetonide) .... Apply Two Times A Day 13)  Norco 5-325 Mg Tabs (Hydrocodone-Acetaminophen) .Marland Kitchen.. 1 By Mouth Q 6 Hr As Needed Pain 14)   Ciprofloxacin Hcl 250 Mg Tabs (Ciprofloxacin Hcl) .Marland Kitchen.. 1 By Mouth Two Times A Day X 5 Days For Cystitis 15)  Chlorhexidine Gluconate  Soln (Chlorhexidine Gluconate) .... Use Mouthwash As Directed For Sore Gums  Allergies (verified): 1)  ! * Antihistamines PMH-FH-SH reviewed-no changes except otherwise noted  Review of Systems  The patient denies fever, headaches, muscle weakness, and difficulty walking.    Physical Exam  General:  Well-developed,well-nourished,in no acute distress; alert,appropriate and cooperative throughout examination Skin:  laceration vertex skull appears to have done well - good approximation of edges. No sign of infection.   Impression & Recommendations:  Problem # 1:  ENCOUNTER FOR REMOVAL OF SUTURES (ICD-V58.32)  wound ok ready for staple removal - assigned to Ami Bullins CMA  Orders: Suture Removal by Non-Operative MD ([id number])  Complete Medication List: 1)  Nexium 40 Mg Cpdr (Esomeprazole magnesium) .... Take 1 capsule by mouth once a day 2)  Xanax 1 Mg Tabs (Alprazolam) .... As directed as needed 3)  Altace 10 Mg Tabs (Ramipril) .... Take 1 tablet by mouth once a day 4)  Norvasc 10 Mg Tabs (  Amlodipine besylate) .... Take 1 tablet by mouth once a day 5)  Pravachol 80 Mg Tabs (Pravastatin sodium) .... Take 1 tablet by mouth once a day 6)  Toprol Xl 100 Mg Tb24 (Metoprolol succinate) .... Take 1 tablet by mouth once a day 7)  Terazosin Hcl 1 Mg Caps (Terazosin hcl) .... Take 1 tablet by mouth once a day 8)  Bayer Aspirin 325 Mg Tabs (Aspirin) .... Take 1 tablet by mouth once a day 9)  Multivitamins Tabs (Multiple vitamin) .... Take 1 tablet by mouth once a day 10)  Nitroquick 0.4 Mg Subl (Nitroglycerin) .... As needed 11)  Dicyclomine Hcl 10 Mg Caps (Dicyclomine hcl) .Marland Kitchen.. 1-2 by mouth every 6 hrs as needed for abdominal pain 12)  Triamcinolone Acetonide 0.1 % Crea (Triamcinolone acetonide) .... Apply two times a day 13)  Norco 5-325 Mg Tabs  (Hydrocodone-acetaminophen) .Marland Kitchen.. 1 by mouth q 6 hr as needed pain 14)  Ciprofloxacin Hcl 250 Mg Tabs (Ciprofloxacin hcl) .Marland Kitchen.. 1 by mouth two times a day x 5 days for cystitis 15)  Chlorhexidine Gluconate Soln (Chlorhexidine gluconate) .... Use mouthwash as directed for sore gums   Orders Added: 1)  Est. Patient Level II [30865] 2)  Suture Removal by Non-Operative MD [[id number]]

## 2010-06-29 NOTE — Progress Notes (Signed)
Summary: SWOLLEN PLACE ON LEG  Phone Note Call from Patient Call back at Home Phone [phone]   Caller: Patient Summary of Call: [facility] CALLED NEEDING AN APPT.  HER SON IS COMING AT 9:00 MON. FOR A NEW PT APPT.  SHE WANTS TO COME WITH HIM OR SOONER.  SHE HAS A PLACE ON HER LEG THAT STARTED OUT AS A SCRATCH A COUPLE OF MONTHS AGO.  IT IS GETTING WORSE AND IS SWOLLEN.  SHE SAID IT'S LIKE A CYST THAT MAY NEED TO BE REMOVED.  WHEN COULD SHE BE WORKED IN.  WOULD MONDAY AM BE OK NEAR HER SON'S APPT.? Initial call taken by: Hilarie Fredrickson,  June 23, 2010 2:30 PM  Follow-up for Phone Call        Monday is OK Follow-up by: Jacques Navy MD,  June 23, 2010 4:22 PM  Additional Follow-up for Phone Call Additional follow up Details #1::        SHE HAS AN APPT ON WED. MARCH 21.  SHE DIDN'T WANT TO COME WHEN HER SON COMES. Additional Follow-up by: Hilarie Fredrickson,  June 24, 2010 9:37 AM

## 2010-06-30 ENCOUNTER — Ambulatory Visit: Payer: Medicare Other | Admitting: Internal Medicine

## 2010-07-16 LAB — URINALYSIS, ROUTINE W REFLEX MICROSCOPIC
Ketones, ur: NEGATIVE mg/dL
Leukocytes, UA: NEGATIVE
Specific Gravity, Urine: 1.01 (ref 1.005–1.030)
pH: 6 (ref 5.0–8.0)

## 2010-07-16 LAB — BASIC METABOLIC PANEL
BUN: 11 mg/dL (ref 6–23)
BUN: 12 mg/dL (ref 6–23)
BUN: 8 mg/dL (ref 6–23)
CO2: 27 mEq/L (ref 19–32)
CO2: 29 mEq/L (ref 19–32)
Calcium: 8.3 mg/dL — ABNORMAL LOW (ref 8.4–10.5)
Calcium: 8.9 mg/dL (ref 8.4–10.5)
Chloride: 97 mEq/L (ref 96–112)
Chloride: 98 mEq/L (ref 96–112)
Creatinine, Ser: 0.84 mg/dL (ref 0.4–1.2)
Creatinine, Ser: 1.15 mg/dL (ref 0.4–1.2)
GFR calc Af Amer: >60 mL/min (ref >60)
GFR calc non Af Amer: 54 mL/min — ABNORMAL LOW (ref >60)
GFR calc non Af Amer: >60 mL/min (ref >60)
GFR calc non Af Amer: >60 mL/min (ref >60)
Glucose, Bld: 102 mg/dL — ABNORMAL HIGH (ref 70–99)
Glucose, Bld: 108 mg/dL — ABNORMAL HIGH (ref 70–99)
Potassium: 3.9 mEq/L (ref 3.5–5.1)
Potassium: 4 mEq/L (ref 3.5–5.1)
Potassium: 4 mEq/L (ref 3.5–5.1)
Potassium: 4 mEq/L (ref 3.5–5.1)
Sodium: 132 mEq/L — ABNORMAL LOW (ref 135–145)
Sodium: 132 mEq/L — ABNORMAL LOW (ref 135–145)
Sodium: 133 mEq/L — ABNORMAL LOW (ref 135–145)

## 2010-07-16 LAB — CBC
HCT: 24.2 % — ABNORMAL LOW (ref 36.0–46.0)
HCT: 30 % — ABNORMAL LOW (ref 36.0–46.0)
HCT: 31.7 % — ABNORMAL LOW (ref 36.0–46.0)
Hemoglobin: 10.2 g/dL — ABNORMAL LOW (ref 12.0–15.0)
MCHC: 35.1 g/dL (ref 30.0–36.0)
MCV: 92.9 fL (ref 78.0–100.0)
MCV: 94.1 fL (ref 78.0–100.0)
Platelets: 132 10*3/uL — ABNORMAL LOW (ref 150–400)
Platelets: 150 10*3/uL (ref 150–400)
Platelets: 173 10*3/uL (ref 150–400)
RDW: 13.2 % (ref 11.5–15.5)
RDW: 13.5 % (ref 11.5–15.5)
RDW: 13.6 % (ref 11.5–15.5)
WBC: 5.8 10*3/uL (ref 4.0–10.5)

## 2010-07-16 LAB — PROTIME-INR: Prothrombin Time: 13 seconds (ref 11.6–15.2)

## 2010-07-16 LAB — URINE CULTURE
Colony Count: NO GROWTH
Culture: NO GROWTH

## 2010-07-16 LAB — DIFFERENTIAL
Basophils Absolute: 0 10*3/uL (ref 0.0–0.1)
Basophils Relative: 0 % (ref 0–1)
Eosinophils Absolute: 0 10*3/uL (ref 0.0–0.7)
Eosinophils Relative: 0 % (ref 0–5)

## 2010-07-18 LAB — URINE MICROSCOPIC-ADD ON

## 2010-07-18 LAB — CBC
HCT: 30.4 % — ABNORMAL LOW (ref 36.0–46.0)
HCT: 33.2 % — ABNORMAL LOW (ref 36.0–46.0)
Hemoglobin: 10.3 g/dL — ABNORMAL LOW (ref 12.0–15.0)
Hemoglobin: 11.5 g/dL — ABNORMAL LOW (ref 12.0–15.0)
MCV: 94.7 fL (ref 78.0–100.0)
Platelets: 113 10*3/uL — ABNORMAL LOW (ref 150–400)
RDW: 13.3 % (ref 11.5–15.5)
WBC: 12 10*3/uL — ABNORMAL HIGH (ref 4.0–10.5)

## 2010-07-18 LAB — BASIC METABOLIC PANEL
BUN: 11 mg/dL (ref 6–23)
BUN: 16 mg/dL (ref 6–23)
CO2: 26 mEq/L (ref 19–32)
CO2: 26 mEq/L (ref 19–32)
Calcium: 8.8 mg/dL (ref 8.4–10.5)
Calcium: 8.8 mg/dL (ref 8.4–10.5)
Calcium: 9.1 mg/dL (ref 8.4–10.5)
Chloride: 101 mEq/L (ref 96–112)
Chloride: 95 mEq/L — ABNORMAL LOW (ref 96–112)
Chloride: 95 mEq/L — ABNORMAL LOW (ref 96–112)
Creatinine, Ser: 1.06 mg/dL (ref 0.4–1.2)
Creatinine, Ser: 1.15 mg/dL (ref 0.4–1.2)
Creatinine, Ser: 1.27 mg/dL — ABNORMAL HIGH (ref 0.4–1.2)
Creatinine, Ser: 1.39 mg/dL — ABNORMAL HIGH (ref 0.4–1.2)
GFR calc Af Amer: 44 mL/min — ABNORMAL LOW (ref >60)
GFR calc Af Amer: 54 mL/min — ABNORMAL LOW (ref >60)
GFR calc Af Amer: 59 mL/min — ABNORMAL LOW (ref >60)
GFR calc non Af Amer: 36 mL/min — ABNORMAL LOW (ref >60)
GFR calc non Af Amer: 45 mL/min — ABNORMAL LOW (ref >60)
Glucose, Bld: 114 mg/dL — ABNORMAL HIGH (ref 70–99)
Potassium: 3.8 mEq/L (ref 3.5–5.1)
Sodium: 134 mEq/L — ABNORMAL LOW (ref 135–145)

## 2010-07-18 LAB — URINE CULTURE: Colony Count: 15000

## 2010-07-18 LAB — URINALYSIS, ROUTINE W REFLEX MICROSCOPIC
Bilirubin Urine: NEGATIVE
Ketones, ur: NEGATIVE mg/dL
Protein, ur: 30 mg/dL — AB
Urobilinogen, UA: 1 mg/dL (ref 0.0–1.0)

## 2010-07-18 LAB — DIFFERENTIAL
Basophils Absolute: 0 10*3/uL (ref 0.0–0.1)
Basophils Absolute: 0 10*3/uL (ref 0.0–0.1)
Basophils Relative: 0 % (ref 0–1)
Eosinophils Absolute: 0 10*3/uL (ref 0.0–0.7)
Eosinophils Absolute: 0 10*3/uL (ref 0.0–0.7)
Eosinophils Relative: 0 % (ref 0–5)
Lymphocytes Relative: 9 % — ABNORMAL LOW (ref 12–46)
Monocytes Absolute: 0.5 10*3/uL (ref 0.1–1.0)
Monocytes Absolute: 0.6 10*3/uL (ref 0.1–1.0)
Neutro Abs: 10.7 10*3/uL — ABNORMAL HIGH (ref 1.7–7.7)
Neutrophils Relative %: 89 % — ABNORMAL HIGH (ref 43–77)

## 2010-07-18 LAB — URINALYSIS, MICROSCOPIC ONLY
Leukocytes, UA: NEGATIVE
Nitrite: NEGATIVE
Protein, ur: 30 mg/dL — AB
Specific Gravity, Urine: 1.006 (ref 1.005–1.030)
Urobilinogen, UA: 1 mg/dL (ref 0.0–1.0)

## 2010-07-18 LAB — COMPREHENSIVE METABOLIC PANEL
ALT: 29 U/L (ref 0–35)
Albumin: 2.9 g/dL — ABNORMAL LOW (ref 3.5–5.2)
Alkaline Phosphatase: 125 U/L — ABNORMAL HIGH (ref 39–117)
BUN: 22 mg/dL (ref 6–23)
Chloride: 90 mEq/L — ABNORMAL LOW (ref 96–112)
Glucose, Bld: 114 mg/dL — ABNORMAL HIGH (ref 70–99)
Potassium: 4.1 mEq/L (ref 3.5–5.1)
Sodium: 124 mEq/L — ABNORMAL LOW (ref 135–145)
Total Bilirubin: 0.8 mg/dL (ref 0.3–1.2)

## 2010-07-18 LAB — CARDIAC PANEL(CRET KIN+CKTOT+MB+TROPI)
Relative Index: INVALID (ref 0.0–2.5)
Relative Index: INVALID (ref 0.0–2.5)
Troponin I: 0.05 ng/mL (ref 0.00–0.06)

## 2010-07-18 LAB — OSMOLALITY, URINE: Osmolality, Ur: 206 mOsm/kg — ABNORMAL LOW (ref 390–1090)

## 2010-07-18 LAB — CORTISOL: Cortisol, Plasma: 21.2 ug/dL

## 2010-07-18 LAB — TSH: TSH: 1.529 u[IU]/mL (ref 0.350–4.500)

## 2010-07-26 ENCOUNTER — Other Ambulatory Visit: Payer: Self-pay | Admitting: *Deleted

## 2010-07-26 NOTE — Telephone Encounter (Signed)
Fax from [facility] [phone], Hydrocodone/ APAP 5-325mg  tab Take one tablet every 6 hours prn for pain qty 60

## 2010-07-27 NOTE — Telephone Encounter (Signed)
Ok for refill  x3 

## 2010-07-28 MED ORDER — HYDROCODONE-ACETAMINOPHEN 5-325 MG PO TABS: 1.0000 | ORAL_TABLET | Freq: Four times a day (QID) | ORAL | Status: DC | PRN

## 2010-07-28 NOTE — Telephone Encounter (Signed)
Refills for hydrocodone called into [facility] Hydrocodone 5-325 qty of 60 with 3 refills

## 2010-08-13 ENCOUNTER — Telehealth: Payer: Self-pay | Admitting: *Deleted

## 2010-08-13 MED ORDER — ESOMEPRAZOLE MAGNESIUM 40 MG PO CPDR: 40.0000 mg | DELAYED_RELEASE_CAPSULE | Freq: Two times a day (BID) | ORAL | Status: DC

## 2010-08-13 MED ORDER — ALPRAZOLAM 1 MG PO TABS: 1.0000 mg | ORAL_TABLET | ORAL | Status: DC

## 2010-08-13 NOTE — Telephone Encounter (Signed)
1. Alprazolam - refill x 5 2. nexium 40 mg bid -# 60 , refill prn

## 2010-08-13 NOTE — Telephone Encounter (Signed)
Pt wants refill on Xanax and Nexium, she states she takes 2 Nexium a day. Please Advise refill. thanks

## 2010-08-16 ENCOUNTER — Other Ambulatory Visit: Payer: Self-pay | Admitting: *Deleted

## 2010-08-16 NOTE — Telephone Encounter (Signed)
Ok to refill phenergan x 5

## 2010-08-16 NOTE — Telephone Encounter (Signed)
Fax from [facility]. Phone [phone] Promethazine HCL 25mg  TAB #30 SIG: take 1/2 to 1 tablet by mouth every 8 hours as needed for nausea. Please Advise refill

## 2010-08-18 MED ORDER — PROMETHAZINE HCL 25 MG PO TABS: 25.0000 mg | ORAL_TABLET | Freq: Three times a day (TID) | ORAL | Status: AC | PRN

## 2010-08-18 NOTE — Telephone Encounter (Signed)
Med sent in.

## 2010-08-24 NOTE — Discharge Summary (Signed)
NAMESERIYAH, COLLISON             ACCOUNT NO.:  [id number]   MEDICAL RECORD NO.:  [id number]          PATIENT TYPE:  INP   LOCATION:  5529                         FACILITY:  [facility]   PHYSICIAN:  Rosalyn Gess. Norins, MD  DATE OF BIRTH:  1920-08-16   DATE OF ADMISSION:  10/17/2008  DATE OF DISCHARGE:  10/20/2008                               DISCHARGE SUMMARY   ADMISSION DIAGNOSES:  1. Urinary tract infection.  2. Dehydration.  3. Hypertension.  4. Hyperlipidemia.  5. Question of dysphagia to solids.  6. Generalized weakness.  7. Fluid and electrolyte imbalance.   DISCHARGE DIAGNOSES:  1. Urinary tract infection.  2. Dehydration.  3. Hypertension.  4. Hyperlipidemia.  5. Question of dysphagia to solids.  6. Generalized weakness.  7. Fluid and electrolyte imbalance.   CONSULTANTS:  None.   PROCEDURES:  Acute abdominal series on October 17, 2008, which showed mild  bibasilar linear atelectasis or scarring.  Mild cardiomegaly.  Mild  changes of chronic obstructive pulmonary disease and chronic bronchitis.  No other acute abnormality was noted.   HISTORY OF PRESENT ILLNESS:  The patient is an 77 year old woman who  presented to the emergency department with complaint of weakness.  She  had recently been seen in the office, diagnosed with urinary tract  infection.  At that time, her white count was 19,500.  The patient also  complained of nausea.  The patient was started on Septra DS twice a day.  She was offered hospitalization because of her symptoms, but declined.  The patient also had complaint of having some mild solid food dysphagia.   The patient called later that night and spoke with her on-call physician  complaining of nausea.  She did have a prescription in the hand for  Phenergan.  She was instructed to use this medication.  She was again  offered the opportunity of hospitalization, but declined.  The patient  continued to fail to thrive for the next several days with  progressive  weakness.  She reports, she had difficulty swallowing foods and was hard  time taking down nutrition.  Because of her ongoing symptoms, she  presented to the emergency department.  She was noted to be very  anxious.  Because of her symptoms, she was admitted to hospital.   Please see H and P for past medical history, family history, social  history, and physical exam.   LABORATORY DATA AT ADMISSION:  Leukocytosis of 12,000, hemoglobin 10.3  g, differential was 89% segs, 60% lymphs and 5% monos.  Chemistries with  sodium 124, potassium 4.1, chloride of 90, CO2 of 24, BUN 22, and  creatinine of 1.49.  Liver functions were normal.   HOSPITAL COURSE:  1. UTI.  The patient was started on Rocephin, which she received for a      full 36 hours.  She was markedly improved at that point and was      switched to p.o. Ceftin on October 19, 2008.  She continued to be      afebrile on this regimen.  Leukocytosis, resolved.  She has white  count of 8600, with a normal differential by October 18, 2008.  Her      urine culture grew out multiple species.  With the patient having      made a good recovery from urinary tract infection with      normalization of her white count, she is felt to be stable to be      discharged to complete a home regimen of oral Ceftin for an      additional 3 days.  2. Dehydration.  The patient did well with IV fluids.  Her creatinine      normalized, so that by the day of discharge, creatinine was 1.06.      Electrolytes otherwise were also imbalanced with a sodium of 134      and a potassium 3.9.  The patient is being fluid repleted with her      electrolytes having normalized, she is able to return home.  3. GI:  The patient was able to take a diet.  She still complains of      mild solid food dysphagia.  This will be evaluated as an outpatient      with referral to GI.  4. Hypertension.  The patient remained well controlled during hospital      stay.   Because of her dehydration, her metoprolol and ACE inhibitor      were stopped.  At this point, these can be resumed.  5. With the patient's medical problems being stable at this time, she      is ready for discharge to home with routine outpatient followup.   DISCHARGE PHYSICAL EXAMINATION:  VITAL SIGNS:  Temperature of 98.3,  blood pressure 155/76, heart rate 96, respirations 21, and O2 sats 91%  on room air.  GENERAL APPEARANCE:  This is a pleasant elderly woman, somewhat anxious,  in no acute distress.  CHEST:  The patient is moving air well with no rales, wheezes, or  rhonchi.  CARDIOVASCULAR:  The patient with 2+ peripheral pulse.  Her precordium  was quiet.  She had a regular rate and rhythm.  No further examination  conducted.   DISCHARGE MEDICATIONS:  The patient will resume all of her home  medications including:  1. Altace 10 mg daily.  2. Aspirin 325 mg daily.  3. Hytrin 1 mg daily.  4. Nitrostat sublingual p.r.n.  5. Norvasc 10 mg daily.  6. Pravachol 80 mg daily.  7. Toprol-XL 100 mg daily.  8. Zetia 10 mg daily.  9. Multivitamins.  10.The patient will take Ceftin 250 mg b.i.d. for an additional 3      days.   CONDITION AT TIME OF DISCHARGE DICTATION:  Stable and improved.     Rosalyn Gess Norins, MD  Electronically Signed    MEN/MEDQ  D:  10/20/2008  T:  10/20/2008  Job:  [id number]

## 2010-08-24 NOTE — H&P (Signed)
Darlene [name], Darlene [name]             ACCOUNT NO.:  [id number]   MEDICAL RECORD NO.:  [id number]          PATIENT TYPE:  EMS   LOCATION:  [facility]                         FACILITY:  [facility]   PHYSICIAN:  Hollice Espy, M.D.DATE OF BIRTH:  1920/10/02   DATE OF ADMISSION:  10/18/2008  DATE OF DISCHARGE:                              HISTORY & PHYSICAL   __________   PRIMARY CARE PHYSICIAN:  Dr. Debby Bud.   CHIEF COMPLAINT:  Generalized weakness.   HISTORY OF PRESENT ILLNESS:  The patient is an 77 year old white female  who presents with a chief complaint of weakness.  The patient recently  had a urinary tract infection and she was seen by Dr. [name] in  the office and she was prescribed a sulfa antibiotic.  Her urinary  symptoms have improved, but she started having problems swallowing,  especially solid foods.  She has no difficulty swallowing liquids.  And  because of this, she has not been having any solid food intake for the  past 4 days and this resulted in generalized weakness.  She denies  having any nausea.  She just complains of the inability to swallow solid  foods.  She reports no aggravating or alleviating factors.  There is no  pain on swallowing.  She has been prescribed a sulfa antibiotic by her  primary MD - we do not know what it is.  In the emergency room, her stay  was uneventful.  She is very, very anxious.  She is wondering if she has  leukemia.   REVIEW OF SYSTEMS:  A complete review of systems was done which included  general, head, eyes, ears, nose, throat, cardiovascular, respiratory,  GI, GU, endocrine, musculoskeletal, skin, neurological and psychiatric -  all are within normal limits other than what is mentioned above.   PAST MEDICAL HISTORY:  1. Obstructive coronary artery disease, status post PCI x2.  2. Hypertension.  3. Colon cancer, status post surgery and chemotherapy.  4. Diverticulosis.  5. Hyperlipidemia.   ALLERGIES:  NONE.   MEDICATIONS AT HOME:  1. Aspirin 81 mg 1 tablet p.o. daily.  2. Nexium 40 mg once a day.  3. Hytrin 1 mg p.o. nightly.  4. Altace 10 mg p.o. daily.  5. Amlodipine 10 mg p.o. daily.  6. Pravastatin 80 mg p.o. nightly.  7. Metoprolol XL 100 mg once a day.  8. Nitroglycerin 0.4 mg sublingual q.5 minutes p.r.n. chest pain.   SOCIAL HISTORY:  The patient is widowed.  She currently lives with her  son.  She is very active and is independent in all her ADLs.  She has no  history of tobacco, alcohol or drugs.   FAMILY HISTORY:  Noncontributory.   PHYSICAL EXAMINATION:  VITAL SIGNS:  T-max 101.2, pulse rate 86, blood  pressure 119/71, respirations 16.  GENERAL APPEARANCE:  Anxious, not in acute distress.  Alert and oriented  to time, place, person and situation.  HEENT:  Normocephalic, atraumatic.  The pupils are equal and react to  light and accommodation.  Extraocular movements are intact.  The mucous  membranes are dry.  NECK:  Supple.  No JVD or lymphadenopathy.  CVS:  Regular rhythm, rate normal.  __________.  No radiation.  LUNGS:  Clear to auscultation bilaterally.  EXTREMITIES:  No clubbing, cyanosis or edema.  ABDOMEN:  Soft, nontender, nondistended.  No hepatosplenomegaly.  NEUROLOGIC:  Grossly nonfocal.   LABS AND STUDIES:  CBC with diff:  WBC 12,000, hemoglobin 10.3,  hematocrit 30.4, platelets 152,000.  Neutrophils 89%.  Lymphocytes 6%.  Monocytes 5%.  BMP:  Sodium 124, potassium 4.1, chloride 90, bicarb 24,  BUN 22, creatinine 1.49.  Total protein 6.3, albumin 2.9, ALT 29, AST  40, alkaline phosphatase 125.  Total bilirubin 0.8.  Serum lipase is 17.  Urinalysis reveals moderate blood and protein.  Nitrates are negative.  There are trace leukocytes.   ASSESSMENT/PLAN:  1. Dysphagia to solids, unclear etiology.  We will evaluate further      with imaging.  We will request a GI consult if needed.  2. Urinary tract infection.  We will stop the Bactrim given the       increase in her serum creatinine.  We will start Cipro 500 b.i.d.  3. Hyponatremia.  The patient is hypovolemic, most likely is      dehydrated.  She also has an ACE inhibitor on board which could      cause hyponatremia.  We will stop the ACE inhibitor.  We will start      the patient on normal saline 100 mL an hour.  We will repeat a BMP      in the morning again.  No neurological signs of hyponatremia, no      seizures.  We will closely monitor.  4. Hypertension, well controlled.  We will stop the ACE inhibitor.  We      will continue metoprolol and amlodipine.  5. Hyperlipidemia.  Continue statin.  6. Diverticulosis.  We will encourage high fiber diet.  7. Obstructive coronary artery disease.  Aggressive risk factor      modification.  We will continue statin.  8. Generalized weakness, likely secondary to lack of caloric intake      from dysphagia.  We will start the patient on a soft mechanical      diet.  9. Deep venous thrombosis with prophylaxis with subcutaneous heparin.  10.Fluid/Electrolyte/Nutrition:  Start the patient on normal saline.      We will replace electrolytes as needed.  She will be started on a      mechanical diet.   DISPOSITION:  The patient will be admitted to the general medicine floor  with telemetry.      Hollice Espy, M.D.  Electronically Signed    SKK/MEDQ  D:  10/18/2008  T:  10/18/2008  Job:  [id number]

## 2010-08-27 NOTE — Assessment & Plan Note (Signed)
[facility]                             PRIMARY CARE OFFICE NOTE   Darlene [name], Darlene [name]                    MRN:          [id number]  DATE:11/21/2005                            DOB:          1921/04/08    I last saw Darlene [name] in December of 2005.  In the interval, she has been  seen in followup by Dr. Leone Payor for  GI and did undergo colonoscopy in  August of 2006 which was a normal study with no polyps seen.  She did have  diverticulosis, and she did have internal and external hemorrhoids.  The  patient has been followed on a regular basis by Dr. Jacinto Halim at [facility].  Last office notes I have are from September 14, 2005, where  she has been followed closely for coronary artery disease.  She has had a  PCI to the RCA in 1988.  She tells me she is supposed to have a stress test  in the near future, but her allergies have made it difficult for her to lay  flat for this exam.  She is also to have an electrocardiogram which is also  pending.   Dr. Jacinto Halim does follow the patient with regards to her hypertension and her  lipids, as well as her heart disease.   PAST MEDICAL HISTORY:  Surgical.  Patient had a right hemicolectomy in 1991.   MEDICAL:  1. Patient had the usual childhood disease.  2. Hypertension.  3. Hyperlipidemia.  4. CAD status post MI with PCA of the coronary arteries x2.  5. Colon cancer.  6. Diverticulosis.  7. History of pneumonia.  8. History of allergies.   CURRENT MEDICATIONS:  1. Altace 10 mg q. day.  2. Norvasc 10 mg q. day.  3. Pravachol 80 mg daily.  4. Toprol XL 100 mg daily.  5. Zetia 10 mg daily.  6. Terazosin 1 mg q. day.  7. Nexium 40 mg daily.  8. Aspirin 325 mg daily.  9. Multivitamin daily.  10.Sublingual nitroglycerin on a p.r.n basis.   FAMILY HISTORY:  Noncontributory.   SOCIAL HISTORY:  Patient was widowed.  She lives with her son.  She remains  very active, and she is independent  in all of her activities of daily  living.  She is quite spry on examination.   HEALTH MAINTENANCE:  Patient reports she saw Dr. Tawanna Cooler Meisinger  approximately 6 months ago for GYN and breast exam.  Her last mammogram  dates from April 06, 2005 and was unremarkable.  Last colonoscopy is as  noted.  Patient has not had general chemistry in our office since 2005, at  which time she had an LDL cholesterol of 80.  She had a serum glucose of  131.  Thyroid function was normal at 2.05.   REVIEW OF SYSTEMS:  The patient has had no fevers, sweats, chills.  She  reports she sleeps well.  She did not have an eye exam for greater than 12  months and is due.  She has not had a dental exam for greater than 2  to 3  years and she is due.  No cardiovascular or respiratory complaint.  She does  have frequent indigestion managed with Nexium.  No GU or musculoskeletal  complaints.   EXAMINATION:  VITAL SIGNS:  Temperature 97.4, blood pressure 134/71, pulse  70, weight 122.  GENERAL:  Patient is a well-nourished slender woman looking younger than her  stated chronologic age in no acute distress.  HEENT:  Normocephalic, atraumatic.  EACs were small, but there was no  cerumen impaction.  Oropharynx with native dentition with no buccal or  palatal lesions noted.  Posterior pharynx was clear.  Conjunctivae and  sclerae was clear.  Pupils equal, round, reactive to light.  NECK:  Supple with an easily palpable thyroid gland, normal in size with no  nodules.  NODES:  No adenopathy was noted in the cervical, supraclavicular regions.  CHEST:  No CVA tenderness.  LUNGS:  Clear to auscultation and percussion.  BREAST:  Exam deferred to Dr. Jackelyn Knife.  CARDIOVASCULAR:  2+ radial pulses.  No JVD, no carotid bruits.  She had a  quiet precordium with a 1 to 2/6 systolic murmur heard best at the right  sternal border.  ABDOMEN:  Soft.  No guarding or rebound.  PELVIC:  Deferred.  RECTAL:  Deferred.  EXTREMITIES:   Without clubbing, cyanosis, edema or deformity.   ASSESSMENT/PLAN:  1. Cardiovascular.  Patient is followed closely by Dr. Jacinto Halim.  He is kind      enough to send me notes.  I am sure I will hear back about her next      Myoview study and echo when they are available.  2. Lipids.  Patient has been managed by Dr. Jacinto Halim.  We have labs as noted,      and I am sure that she has had recent laboratory at [facility]      [facility].  Will defer to their management.  3. Hypertension.  Patient's blood pressure is adequately controlled at      today's visit.  She will continue on her present medications.   PLAN:  1. Will get a basic metabolic panel for followup.  2. GI:  The patient is stable on Nexium, and she has had colorectal cancer      screening given her prior history of colon cancer, and this was a      negative study.  3. Allergies:  Patient has significant problems with rhinorrhea which does      seem to be seasonal.  She had no tenderness to percussion over her      frontal or maxillary sinuses.  No evidence of infection.  No evidence      of significant congestion.  Plan:  Patient is to use loratadine 10 mg      q. day over-the-counter product for relief of her sinus symptoms.   SUMMARY:  This is a very pleasant woman who is up to date with health  maintenance and followed by her sub-specialist who seems to be doing well.  She will be notified by phone tree of the labs that we are ordering.                                   Rosalyn Gess Norins, MD   MEN/MedQ  DD:  11/21/2005  DT:  11/21/2005  Job #:  [id number]   cc:   Charlett Lango R. Jacinto Halim, MD  Zenaida Niece, MD

## 2010-09-01 ENCOUNTER — Telehealth: Payer: Self-pay | Admitting: *Deleted

## 2010-09-01 NOTE — Telephone Encounter (Signed)
Pt calling states she cannot take the alprazolam, she wants to start back on Lorazepam 0.5mg  one tablet at bedtime. She wants this called into [facility]. Please Advise

## 2010-09-01 NOTE — Telephone Encounter (Signed)
OK. Return the alprazolam. New Rx lorazepam 0.5mg  1 po qhs, #30, refill x 5

## 2010-09-02 MED ORDER — LORAZEPAM 0.5 MG PO TABS: 0.5000 mg | ORAL_TABLET | Freq: Every day | ORAL | Status: DC

## 2010-09-02 NOTE — Telephone Encounter (Signed)
Lorazepam called in for pt. I also canceled the alprazolam at the pharm. Pt never pick up or [facility] delivered this medication.

## 2010-09-09 ENCOUNTER — Other Ambulatory Visit: Payer: Self-pay | Admitting: *Deleted

## 2010-09-09 MED ORDER — TRIAMCINOLONE ACETONIDE 0.1 % EX CREA: TOPICAL_CREAM | Freq: Two times a day (BID) | CUTANEOUS | Status: DC

## 2010-10-11 ENCOUNTER — Telehealth: Payer: Self-pay | Admitting: *Deleted

## 2010-10-11 NOTE — Telephone Encounter (Signed)
Will be happy to refill but...... 1. For what pain does she take norco 2. How many per day is she routinely taking. Thanks

## 2010-10-11 NOTE — Telephone Encounter (Signed)
Fax from [facility] [phone] Hydrocodone/ APAP 5-325mg   SIG: take one tablet every 6 hours prn for pain. QTY 60  Last fill was 09/24/2010, Please Advise refills.

## 2010-10-12 MED ORDER — HYDROCODONE-ACETAMINOPHEN 5-325 MG PO TABS: 1.0000 | ORAL_TABLET | Freq: Two times a day (BID) | ORAL | Status: DC

## 2010-10-12 NOTE — Telephone Encounter (Signed)
OK. Norco #60, 1 po bid, 3 refills

## 2010-10-12 NOTE — Telephone Encounter (Signed)
Pt takes Norco for hip pain, she states she takes up to two per day.

## 2010-10-14 ENCOUNTER — Ambulatory Visit: Payer: Medicare Other | Admitting: Internal Medicine

## 2010-10-26 ENCOUNTER — Ambulatory Visit: Payer: Medicare Other | Admitting: Internal Medicine

## 2010-10-28 ENCOUNTER — Ambulatory Visit: Payer: Medicare Other | Admitting: Internal Medicine

## 2010-11-09 ENCOUNTER — Other Ambulatory Visit (INDEPENDENT_AMBULATORY_CARE_PROVIDER_SITE_OTHER): Payer: Medicare Other

## 2010-11-09 ENCOUNTER — Ambulatory Visit (INDEPENDENT_AMBULATORY_CARE_PROVIDER_SITE_OTHER): Payer: Medicare Other | Admitting: Internal Medicine

## 2010-11-09 DIAGNOSIS — E785 Hyperlipidemia, unspecified: Secondary | ICD-10-CM

## 2010-11-09 DIAGNOSIS — I10 Essential (primary) hypertension: Secondary | ICD-10-CM

## 2010-11-09 DIAGNOSIS — C44111 Basal cell carcinoma of skin of unspecified eyelid, including canthus: Secondary | ICD-10-CM | POA: Insufficient documentation

## 2010-11-09 LAB — CBC WITH DIFFERENTIAL/PLATELET
Basophils Relative: 0.3 % (ref 0.0–3.0)
Eosinophils Absolute: 0 10*3/uL (ref 0.0–0.7)
HCT: 32.4 % — ABNORMAL LOW (ref 36.0–46.0)
Hemoglobin: 10.6 g/dL — ABNORMAL LOW (ref 12.0–15.0)
Lymphocytes Relative: 21.9 % (ref 12.0–46.0)
Lymphs Abs: 2 10*3/uL (ref 0.7–4.0)
MCHC: 32.5 g/dL (ref 30.0–36.0)
MCV: 83.7 fl (ref 78.0–100.0)
Neutro Abs: 6.6 10*3/uL (ref 1.4–7.7)
RBC: 3.87 Mil/uL (ref 3.87–5.11)

## 2010-11-09 LAB — HEPATIC FUNCTION PANEL
AST: 23 U/L (ref 0–37)
Albumin: 4.4 g/dL (ref 3.5–5.2)
Alkaline Phosphatase: 82 U/L (ref 39–117)
Total Protein: 7.5 g/dL (ref 6.0–8.3)

## 2010-11-09 LAB — COMPREHENSIVE METABOLIC PANEL
ALT: 17 U/L (ref 0–35)
AST: 23 U/L (ref 0–37)
Alkaline Phosphatase: 82 U/L (ref 39–117)
BUN: 17 mg/dL (ref 6–23)
Creatinine, Ser: 1.1 mg/dL (ref 0.4–1.2)
Total Bilirubin: 0.7 mg/dL (ref 0.3–1.2)

## 2010-11-09 LAB — LIPID PANEL
Cholesterol: 170 mg/dL (ref 0–200)
HDL: 64.2 mg/dL (ref >39.00)
Triglycerides: 119 mg/dL (ref 0.0–149.0)

## 2010-11-09 LAB — TSH: TSH: 2.16 u[IU]/mL (ref 0.35–5.50)

## 2010-11-09 NOTE — Progress Notes (Signed)
Subjective:    Patient ID: Darlene [name], female    DOB: [birth year], 77 y.o.   MRN: [id number]  HPI Darlene [name] presents for follow-up. She has a lesion on the medial aspect of the left lower eyelid. She reports that she has been feeling ok. She is having nocturnal chest pain. She will have occasional chest pain but not with exertion. She denies SOB or diaphoresis. She sees Dr. Jacinto Halim - who she used to see - with last visit in the last few months. She has not told him about her chest pain. Everything else is ok.   Past Medical History  Diagnosis Date  . UTI (urinary tract infection)   . IBS (irritable bowel syndrome)   . Abdominal pain, left lower quadrant   . CAD (coronary artery disease)   . Pneumonia   . HTN (hypertension)   . Hyperlipidemia   . Diverticulosis of colon   . History of colon cancer   . Allergic rhinitis    Past Surgical History  Procedure Date  . Colectomy   . Hemicolectomy 1991    Right  . Angioplasty     Stent x 2  . Appendectomy   . Bilateral salpingoophorectomy   . Breast surgery     benign  . Orif hip fracture 12/2009    Dr Charlann Boxer   Family History  Problem Relation Age of Onset  . Colon cancer Neg Hx   . Breast cancer Neg Hx    History   Social History  . Marital Status: Legally Separated    Spouse Name: N/A    Number of Children: N/A  . Years of Education: N/A   Occupational History  . Retired    Social History Main Topics  . Smoking status: Never Smoker   . Smokeless tobacco: Not on file  . Alcohol Use: No  . Drug Use: No  . Sexually Active: Not on file   Other Topics Concern  . Not on file   Social History Narrative   PHYSICIAN ROSTERCardiology - Dr Lynnell Chad - Dr Meyer Russel - Dr Manfred Arch, Son lives w/her and has multiple medical problems. She has family in [place] - a nephew was recently murdered.Daily Caffeine - CokesEND OF LIFE CARE: Patient hesitant to decide "I will decide at the time." thus she is a full code. She does not  want to be a vegetable.        Review of Systems Review of Systems  Constitutional:  Negative for fever, chills, activity change and unexpected weight change.  HEENT:  Negative for hearing loss, ear pain, congestion, neck stiffness and postnasal drip. Negative for sore throat or swallowing problems. Negative for dental complaints.   Eyes: Negative for vision loss or change in visual acuity. growth on the eyelid left lower Respiratory: Negative for chest tightness. She c/o wheezing.   Cardiovascular: Posittive for chest pain, usually at night and palpitation. No decreased exercise tolerance Gastrointestinal: No change in bowel habit. No bloating or gas. No reflux or indigestion Genitourinary: Negative for urgency, frequency, flank pain and difficulty urinating.  Musculoskeletal: Negative for myalgias, back pain, arthralgias and gait problem.  Neurological: Negative for dizziness, tremors, weakness and headaches.  Hematological: Negative for adenopathy.  Psychiatric/Behavioral: Negative for behavioral problems and dysphoric mood.       Objective:   Physical Exam Vitals noted - good BP  Gen'; - small white woman who looks younger than her stated age in no distress HEENT - Morrison/AT, C&S clear. Marland Kitchen5 cm  raised lesion left lower eyelid with central erosion. The palpebral conjunctiva is inflamed. Chest - clear Cor - RRR Abdomen- no guarding or rebound. No tenderness.  Lab Results  Component Value Date   WBC 9.1 11/09/2010   HGB 10.6* 11/09/2010   HCT 32.4* 11/09/2010   PLT 224.0 11/09/2010   CHOL 170 11/09/2010   TRIG 119.0 11/09/2010   HDL 64.20 11/09/2010   ALT 17 11/09/2010   ALT 17 11/09/2010   AST 23 11/09/2010   AST 23 11/09/2010   NA 133* 11/09/2010   K 4.6 11/09/2010   CL 99 11/09/2010   CREATININE 1.1 11/09/2010   BUN 17 11/09/2010   CO2 26 11/09/2010   TSH 2.16 11/09/2010   INR 1.0 12/10/2008   Lab Results  Component Value Date   LDLCALC 82 11/09/2010          Assessment & Plan:

## 2010-11-11 NOTE — Assessment & Plan Note (Signed)
Patient with a leson on the left lower eyelid that is suspicious for a basal cell carcinoma  Plan - referral to opthalmology

## 2010-11-11 NOTE — Assessment & Plan Note (Signed)
BP Readings from Last 3 Encounters:  11/09/10 136/70  06/18/10 128/70  04/08/10 140/72   Acceptable control. Will continue present medication.

## 2010-11-11 NOTE — Assessment & Plan Note (Signed)
Lab reveals excellent control.  Plan - continue present regimen.

## 2010-11-15 ENCOUNTER — Encounter: Payer: Self-pay | Admitting: Internal Medicine

## 2010-11-22 ENCOUNTER — Other Ambulatory Visit: Payer: Self-pay | Admitting: Ophthalmology

## 2011-01-05 ENCOUNTER — Telehealth: Payer: Self-pay | Admitting: *Deleted

## 2011-01-05 MED ORDER — LORAZEPAM 0.5 MG PO TABS: 0.5000 mg | ORAL_TABLET | Freq: Every day | ORAL | Status: DC

## 2011-01-05 NOTE — Telephone Encounter (Signed)
Pt has found out she has malignant place on her eyelid, and she has got to have 6 teeth pulled. Pt called wanting to know if MD would prescribed qty of 60 on her lorazepam instead of #30. Please Advise, [facility] uses [facility]

## 2011-01-05 NOTE — Telephone Encounter (Signed)
k

## 2011-01-18 ENCOUNTER — Telehealth: Payer: Self-pay | Admitting: *Deleted

## 2011-01-18 NOTE — Telephone Encounter (Signed)
Ok to refill x 3 

## 2011-01-18 NOTE — Telephone Encounter (Signed)
Please Advise refill on pts hydrocodone

## 2011-01-19 MED ORDER — HYDROCODONE-ACETAMINOPHEN 5-325 MG PO TABS: 1.0000 | ORAL_TABLET | Freq: Two times a day (BID) | ORAL | Status: DC

## 2011-03-18 ENCOUNTER — Other Ambulatory Visit: Payer: Self-pay | Admitting: *Deleted

## 2011-03-18 MED ORDER — ESOMEPRAZOLE MAGNESIUM 40 MG PO CPDR: 40.0000 mg | DELAYED_RELEASE_CAPSULE | Freq: Two times a day (BID) | ORAL | Status: DC

## 2011-03-24 ENCOUNTER — Telehealth: Payer: Self-pay | Admitting: *Deleted

## 2011-03-24 NOTE — Telephone Encounter (Signed)
Refill request from [facility] for Lorazepam 0.5 qty 60  Please advise refills

## 2011-03-24 NOTE — Telephone Encounter (Signed)
Needs OV in Jan or Few '13.  OK for refill on ativan x 3

## 2011-03-25 MED ORDER — LORAZEPAM 0.5 MG PO TABS: 0.5000 mg | ORAL_TABLET | Freq: Every day | ORAL | Status: DC

## 2011-04-07 NOTE — Telephone Encounter (Signed)
Medication refill

## 2011-04-08 ENCOUNTER — Other Ambulatory Visit: Payer: Self-pay | Admitting: *Deleted

## 2011-04-08 MED ORDER — TRIAMCINOLONE ACETONIDE 0.1 % EX CREA: TOPICAL_CREAM | Freq: Two times a day (BID) | CUTANEOUS | Status: DC

## 2011-05-02 ENCOUNTER — Telehealth: Payer: Self-pay

## 2011-05-02 NOTE — Telephone Encounter (Signed)
Ok for one month - needs OV, last was July '12

## 2011-05-02 NOTE — Telephone Encounter (Signed)
Pharmacy called requesting refill of Hydrocodone 5-325 mg 1 po bid prn pain. Lat filled 04/08/2011

## 2011-05-03 ENCOUNTER — Telehealth: Payer: Self-pay | Admitting: Internal Medicine

## 2011-05-03 NOTE — Telephone Encounter (Signed)
The pt called and is requesting a refill of Norco sent to [name]. Thanks!

## 2011-05-04 ENCOUNTER — Telehealth: Payer: Self-pay | Admitting: *Deleted

## 2011-05-04 MED ORDER — HYDROCODONE-ACETAMINOPHEN 5-325 MG PO TABS: 1.0000 | ORAL_TABLET | Freq: Two times a day (BID) | ORAL | Status: DC

## 2011-05-04 NOTE — Telephone Encounter (Signed)
I thought I had done this already. Please check.  It is ok to refill x 3

## 2011-05-04 NOTE — Telephone Encounter (Signed)
Phoned in refill.  

## 2011-05-04 NOTE — Telephone Encounter (Signed)
Refill request for Hydrocodone [facility]) 5-325mg  #60 with 3 refills. Last refilled on 10.10.12. OK to refill?

## 2011-06-03 ENCOUNTER — Other Ambulatory Visit: Payer: Self-pay | Admitting: Internal Medicine

## 2011-06-29 ENCOUNTER — Other Ambulatory Visit: Payer: Self-pay | Admitting: *Deleted

## 2011-06-29 NOTE — Telephone Encounter (Signed)
Lorazepam 0.5 mg request [last refill 05.04.12 #30x5]

## 2011-06-30 NOTE — Telephone Encounter (Signed)
Ok for refill x 5 

## 2011-07-01 MED ORDER — LORAZEPAM 0.5 MG PO TABS: 0.5000 mg | ORAL_TABLET | Freq: Every day | ORAL | Status: DC

## 2011-07-01 NOTE — Telephone Encounter (Signed)
Done

## 2011-07-05 ENCOUNTER — Telehealth: Payer: Self-pay | Admitting: Internal Medicine

## 2011-07-05 NOTE — Telephone Encounter (Signed)
The pt called and is hoping to be worked in Thursday morning because she is feeling short of breath.  She was offered an appointment sooner, but states Thursday morning is the only time she can get a ride.    Thanks!

## 2011-07-05 NOTE — Telephone Encounter (Signed)
[name] asked me to add patient on today - she got a ride.

## 2011-07-06 ENCOUNTER — Encounter: Payer: Self-pay | Admitting: Internal Medicine

## 2011-07-06 ENCOUNTER — Ambulatory Visit (INDEPENDENT_AMBULATORY_CARE_PROVIDER_SITE_OTHER)
Admission: RE | Admit: 2011-07-06 | Discharge: 2011-07-06 | Disposition: A | Discharge status: Home / Self Care | Payer: Medicare Other | Source: Ambulatory Visit | Attending: Internal Medicine | Admitting: Internal Medicine

## 2011-07-06 ENCOUNTER — Other Ambulatory Visit (INDEPENDENT_AMBULATORY_CARE_PROVIDER_SITE_OTHER): Payer: Medicare Other

## 2011-07-06 ENCOUNTER — Ambulatory Visit: Payer: Medicare Other | Admitting: Internal Medicine

## 2011-07-06 ENCOUNTER — Ambulatory Visit (INDEPENDENT_AMBULATORY_CARE_PROVIDER_SITE_OTHER): Payer: Medicare Other | Admitting: Internal Medicine

## 2011-07-06 VITALS — BP 124/62 | HR 83 | Temp 98.2°F | Resp 14 | Wt 126.5 lb

## 2011-07-06 DIAGNOSIS — E785 Hyperlipidemia, unspecified: Secondary | ICD-10-CM

## 2011-07-06 DIAGNOSIS — R0602 Shortness of breath: Secondary | ICD-10-CM

## 2011-07-06 DIAGNOSIS — C44111 Basal cell carcinoma of skin of unspecified eyelid, including canthus: Secondary | ICD-10-CM

## 2011-07-06 DIAGNOSIS — I10 Essential (primary) hypertension: Secondary | ICD-10-CM

## 2011-07-06 DIAGNOSIS — R0609 Other forms of dyspnea: Secondary | ICD-10-CM

## 2011-07-06 DIAGNOSIS — J309 Allergic rhinitis, unspecified: Secondary | ICD-10-CM

## 2011-07-06 DIAGNOSIS — R0989 Other specified symptoms and signs involving the circulatory and respiratory systems: Secondary | ICD-10-CM

## 2011-07-06 DIAGNOSIS — I2510 Atherosclerotic heart disease of native coronary artery without angina pectoris: Secondary | ICD-10-CM

## 2011-07-06 LAB — HEPATIC FUNCTION PANEL
ALT: 14 U/L (ref 0–35)
AST: 21 U/L (ref 0–37)
Albumin: 4 g/dL (ref 3.5–5.2)
Alkaline Phosphatase: 64 U/L (ref 39–117)
Total Protein: 7.1 g/dL (ref 6.0–8.3)

## 2011-07-06 LAB — COMPREHENSIVE METABOLIC PANEL
ALT: 14 U/L (ref 0–35)
AST: 21 U/L (ref 0–37)
Alkaline Phosphatase: 64 U/L (ref 39–117)
Creatinine, Ser: 1.2 mg/dL (ref 0.4–1.2)
GFR: 46.78 mL/min — ABNORMAL LOW (ref >60.00)
Sodium: 130 mEq/L — ABNORMAL LOW (ref 135–145)
Total Bilirubin: 0.2 mg/dL — ABNORMAL LOW (ref 0.3–1.2)
Total Protein: 7.1 g/dL (ref 6.0–8.3)

## 2011-07-06 LAB — LIPID PANEL
HDL: 59.8 mg/dL (ref >39.00)
Total CHOL/HDL Ratio: 2

## 2011-07-06 NOTE — Patient Instructions (Signed)
Shortness of breath with exertion. Chart reviewed - no prior history of any lung disease but there is a history of hear disease. I am concerned you shortness of breath is related to your heart not your lungs.  Plan - chest x-ray           Lab to check on your risk factors (cholesterol, etc)           No change in medication at this time           You need to see Dr. Jacinto Halim and let him do a 2 D echo to evaluate your heart and heart function.

## 2011-07-06 NOTE — Progress Notes (Signed)
Subjective:    Patient ID: Darlene [name], female    DOB: 12-07-1920, 78 y.o.   MRN: [id number]  HPI Darlene [name] presents for several weeks of SOB/DOE and she reports that when she climbs stairs or exerts herself she will get SOB and have "wheezing." She repoerts chronic chest pain. No cough. No chest pain. She does follow with Darlene. Jacinto Halim. Reviewed her record here, [facility] and reviewed Darlene. Verl Dicker note: no evidence of primaruy pulmonary disease but last CXR was in '10. She was to follow-up with Darlene. Jacinto Halim several months ago but did not due to concern about influenza epidemic.  Past Medical History  Diagnosis Date  . UTI (urinary tract infection)   . IBS (irritable bowel syndrome)   . Abdominal pain, left lower quadrant   . CAD (coronary artery disease)   . Pneumonia   . HTN (hypertension)   . Hyperlipidemia   . Diverticulosis of colon   . History of colon cancer   . Allergic rhinitis    Past Surgical History  Procedure Date  . Colectomy   . Hemicolectomy 1991    Right  . Angioplasty     Stent x 2  . Appendectomy   . Bilateral salpingoophorectomy   . Breast surgery     benign  . Orif hip fracture 12/2009    Darlene [name]   Family History  Problem Relation Age of Onset  . Colon cancer Neg Hx   . Breast cancer Neg Hx    History   Social History  . Marital Status: Legally Separated    Spouse Name: N/A    Number of Children: N/A  . Years of Education: N/A   Occupational History  . Retired    Social History Main Topics  . Smoking status: Never Smoker   . Smokeless tobacco: Not on file  . Alcohol Use: No  . Drug Use: No  . Sexually Active: Not on file   Other Topics Concern  . Not on file   Social History Narrative   PHYSICIAN ROSTERCardiology - Darlene [name] - Darlene [name] - Darlene [name], Son lives w/her and has multiple medical problems. She has family in [place] - a nephew was recently murdered.Daily Caffeine - CokesEND OF LIFE CARE: Patient hesitant to decide "I will  decide at the time." thus she is a full code. She does not want to be a vegetable.        Review of Systems System review is negative for any constitutional, cardiac, pulmonary, GI or neuro symptoms or complaints other than as described in the HPI.     Objective:   Physical Exam Filed Vitals:   07/06/11 1530  BP: 124/62  Pulse: 83  Temp: 98.2 F (36.8 C)  Resp: 14   Wt Readings from Last 3 Encounters:  07/06/11 126 lb 8 oz (57.38 kg)  11/09/10 122 lb (55.339 kg)  06/18/10 119 lb (53.978 kg)   O2 sat 95% RA Gen'l- spry 78 y/o in no distress HEENT - she has a large growth on the medial aspect of the left lower eyelid which is raised, tan, waxy and has small vessels. Chest - good breath sounds w/o rales or wheezesa Cor - 2+ radial pulse, RRR II/VI murmur RSB, no JVD Abd - soft  Lab Results  Component Value Date   WBC 9.1 11/09/2010   HGB 10.6* 11/09/2010   HCT 32.4* 11/09/2010   PLT 224.0 11/09/2010   GLUCOSE 119* 07/06/2011   CHOL 139 07/06/2011  TRIG 149.0 07/06/2011   HDL 59.80 07/06/2011   LDLCALC 49 07/06/2011        ALT 14 07/06/2011   AST 21 07/06/2011        NA 130* 07/06/2011   K 4.9 07/06/2011   CL 95* 07/06/2011   CREATININE 1.2 07/06/2011   BUN 18 07/06/2011   CO2 26 07/06/2011   TSH 1.71 07/06/2011   INR 1.0 12/10/2008       pBNP                     378                                                                  07/06/2011  CXR IMPRESSION:  Moderate enlargement of the cardiac silhouette. Generalized  hyperinflation consistent with COPD. No pulmonary edema,  pneumonia, or pleural effusion.  Osteopenic appearance of bones. Compression fracture of a lower  thoracic vertebral body of undetermined age.       Assessment & Plan:

## 2011-07-09 DIAGNOSIS — R0602 Shortness of breath: Secondary | ICD-10-CM | POA: Insufficient documentation

## 2011-07-09 NOTE — Assessment & Plan Note (Signed)
LDL is well below goal of 80 or less in high risk patient.  Plan- continue present treatment

## 2011-07-09 NOTE — Assessment & Plan Note (Signed)
BP Readings from Last 3 Encounters:  07/06/11 124/62  11/09/10 136/70  06/18/10 128/70   Good control. NO change in medications.

## 2011-07-09 NOTE — Assessment & Plan Note (Signed)
Doing well in the office: normal Oxygen saturation on room air, no increased Work of breathing. Exam is normal - lungs are clear. No evaluation of LV function in EPIC. Chest x-ray with COPD/Emphysematous changes. pBNP minimally elevated at 378 - does not explain DOE  Plan - no treatment recommendations at this time           For persistent or worsening symptoms will assess LV function.

## 2011-07-09 NOTE — Assessment & Plan Note (Signed)
Stable with no active symptoms.   Plan - continue with risk modification.

## 2011-07-09 NOTE — Assessment & Plan Note (Signed)
Patient has missed surgical appointment. She is encouraged to tend to this lesion.

## 2011-07-10 ENCOUNTER — Encounter: Payer: Self-pay | Admitting: Internal Medicine

## 2011-07-13 ENCOUNTER — Ambulatory Visit: Payer: Medicare Other | Admitting: Internal Medicine

## 2011-07-14 ENCOUNTER — Other Ambulatory Visit: Payer: Self-pay | Admitting: *Deleted

## 2011-07-14 NOTE — Telephone Encounter (Signed)
Spoke w/pharmacist concerning patient's Hydrocodone-APAP prescription sent in 01.23.13 #60x3; pharmacy incorrectly entered #60x2 into their system, this has been corrected and patient has been informed that remaining refill is available [first refill done 02.23.13, they allowed an early refill on 03.11.13?].

## 2011-08-06 ENCOUNTER — Encounter (HOSPITAL_COMMUNITY): Payer: Self-pay | Admitting: *Deleted

## 2011-08-06 ENCOUNTER — Inpatient Hospital Stay (HOSPITAL_COMMUNITY)
Admission: EM | Admit: 2011-08-06 | Discharge: 2011-08-12 | DRG: 280 | Disposition: A | Discharge status: Home Health | Payer: Medicare Other | Attending: Internal Medicine | Admitting: Internal Medicine

## 2011-08-06 DIAGNOSIS — I10 Essential (primary) hypertension: Secondary | ICD-10-CM | POA: Diagnosis present

## 2011-08-06 DIAGNOSIS — I2510 Atherosclerotic heart disease of native coronary artery without angina pectoris: Secondary | ICD-10-CM | POA: Diagnosis present

## 2011-08-06 DIAGNOSIS — E871 Hypo-osmolality and hyponatremia: Secondary | ICD-10-CM | POA: Diagnosis not present

## 2011-08-06 DIAGNOSIS — R0902 Hypoxemia: Secondary | ICD-10-CM | POA: Diagnosis present

## 2011-08-06 DIAGNOSIS — Z85038 Personal history of other malignant neoplasm of large intestine: Secondary | ICD-10-CM

## 2011-08-06 DIAGNOSIS — C44111 Basal cell carcinoma of skin of unspecified eyelid, including canthus: Secondary | ICD-10-CM | POA: Diagnosis present

## 2011-08-06 DIAGNOSIS — I509 Heart failure, unspecified: Secondary | ICD-10-CM | POA: Diagnosis present

## 2011-08-06 DIAGNOSIS — D649 Anemia, unspecified: Secondary | ICD-10-CM | POA: Diagnosis present

## 2011-08-06 DIAGNOSIS — K922 Gastrointestinal hemorrhage, unspecified: Secondary | ICD-10-CM | POA: Diagnosis present

## 2011-08-06 DIAGNOSIS — I252 Old myocardial infarction: Secondary | ICD-10-CM

## 2011-08-06 DIAGNOSIS — Z79899 Other long term (current) drug therapy: Secondary | ICD-10-CM

## 2011-08-06 DIAGNOSIS — Z9861 Coronary angioplasty status: Secondary | ICD-10-CM

## 2011-08-06 DIAGNOSIS — I5033 Acute on chronic diastolic (congestive) heart failure: Secondary | ICD-10-CM | POA: Diagnosis present

## 2011-08-06 DIAGNOSIS — K5730 Diverticulosis of large intestine without perforation or abscess without bleeding: Secondary | ICD-10-CM | POA: Diagnosis present

## 2011-08-06 DIAGNOSIS — I249 Acute ischemic heart disease, unspecified: Secondary | ICD-10-CM

## 2011-08-06 DIAGNOSIS — I200 Unstable angina: Secondary | ICD-10-CM | POA: Insufficient documentation

## 2011-08-06 DIAGNOSIS — E785 Hyperlipidemia, unspecified: Secondary | ICD-10-CM | POA: Diagnosis present

## 2011-08-06 DIAGNOSIS — Z9981 Dependence on supplemental oxygen: Secondary | ICD-10-CM

## 2011-08-06 DIAGNOSIS — Z7982 Long term (current) use of aspirin: Secondary | ICD-10-CM

## 2011-08-06 DIAGNOSIS — J90 Pleural effusion, not elsewhere classified: Secondary | ICD-10-CM | POA: Diagnosis present

## 2011-08-06 DIAGNOSIS — F411 Generalized anxiety disorder: Secondary | ICD-10-CM | POA: Diagnosis present

## 2011-08-06 DIAGNOSIS — I214 Non-ST elevation (NSTEMI) myocardial infarction: Secondary | ICD-10-CM | POA: Diagnosis present

## 2011-08-06 DIAGNOSIS — Z888 Allergy status to other drugs, medicaments and biological substances status: Secondary | ICD-10-CM

## 2011-08-06 HISTORY — DX: Malignant (primary) neoplasm, unspecified: C80.1

## 2011-08-06 HISTORY — DX: Reserved for inherently not codable concepts without codable children: IMO0001

## 2011-08-06 HISTORY — DX: Unspecified osteoarthritis, unspecified site: M19.90

## 2011-08-06 HISTORY — DX: Anxiety disorder, unspecified: F41.9

## 2011-08-06 HISTORY — DX: Anemia, unspecified: D64.9

## 2011-08-06 HISTORY — DX: Angina pectoris, unspecified: I20.9

## 2011-08-06 HISTORY — DX: Encounter for other specified aftercare: Z51.89

## 2011-08-06 HISTORY — DX: Shortness of breath: R06.02

## 2011-08-06 LAB — COMPREHENSIVE METABOLIC PANEL
AST: 26 U/L (ref 0–37)
Albumin: 3.5 g/dL (ref 3.5–5.2)
CO2: 21 mEq/L (ref 19–32)
Calcium: 9.2 mg/dL (ref 8.4–10.5)
Creatinine, Ser: 1.37 mg/dL — ABNORMAL HIGH (ref 0.50–1.10)
GFR calc non Af Amer: 33 mL/min — ABNORMAL LOW (ref >90)

## 2011-08-06 LAB — POCT I-STAT, CHEM 8
Calcium, Ion: 1.09 mmol/L — ABNORMAL LOW (ref 1.12–1.32)
Chloride: 95 mEq/L — ABNORMAL LOW (ref 96–112)
HCT: 24 % — ABNORMAL LOW (ref 36.0–46.0)

## 2011-08-06 LAB — CBC
MCH: 23.9 pg — ABNORMAL LOW (ref 26.0–34.0)
MCHC: 31 g/dL (ref 30.0–36.0)
MCV: 77.1 fL — ABNORMAL LOW (ref 78.0–100.0)
Platelets: 209 10*3/uL (ref 150–400)
RDW: 15.1 % (ref 11.5–15.5)

## 2011-08-06 LAB — POCT I-STAT TROPONIN I
Troponin i, poc: 0.02 ng/mL (ref 0.00–0.08)
Troponin i, poc: 0.02 ng/mL (ref 0.00–0.08)

## 2011-08-06 LAB — PROTIME-INR
INR: 1.07 (ref 0.00–1.49)
Prothrombin Time: 14.1 seconds (ref 11.6–15.2)

## 2011-08-06 SURGERY — Surgical Case
Anesthesia: *Unknown

## 2011-08-06 MED ORDER — DICYCLOMINE HCL 10 MG PO CAPS
10.0000 mg | ORAL_CAPSULE | Freq: Four times a day (QID) | ORAL | Status: DC | PRN
  Filled 2011-08-06: qty 2

## 2011-08-06 MED ORDER — SODIUM CHLORIDE 0.9 % IV SOLN
INTRAVENOUS | Status: DC
  Administered 2011-08-06: via INTRAVENOUS
  Administered 2011-08-07: 100 mL via INTRAVENOUS
  Administered 2011-08-09: 500 mL via INTRAVENOUS

## 2011-08-06 MED ORDER — SIMVASTATIN 5 MG PO TABS
5.0000 mg | ORAL_TABLET | Freq: Every day | ORAL | Status: DC
  Administered 2011-08-07 – 2011-08-11 (×5): 5 mg via ORAL
  Filled 2011-08-06 (×8): qty 1

## 2011-08-06 MED ORDER — NITROGLYCERIN IN D5W 200-5 MCG/ML-% IV SOLN
10.0000 ug/min | INTRAVENOUS | Status: DC
  Administered 2011-08-06: 10 ug/min via INTRAVENOUS
  Filled 2011-08-06: qty 250

## 2011-08-06 MED ORDER — ONDANSETRON HCL 4 MG/2ML IJ SOLN: 4.0000 mg | Freq: Four times a day (QID) | INTRAMUSCULAR | Status: DC | PRN

## 2011-08-06 MED ORDER — ADULT MULTIVITAMIN W/MINERALS CH
1.0000 | ORAL_TABLET | Freq: Every day | ORAL | Status: DC
  Administered 2011-08-08 – 2011-08-12 (×3): 1 via ORAL
  Filled 2011-08-06 (×7): qty 1

## 2011-08-06 MED ORDER — SODIUM CHLORIDE 0.9 % IV SOLN: INTRAVENOUS | Status: AC

## 2011-08-06 MED ORDER — ASPIRIN 325 MG PO TABS
325.0000 mg | ORAL_TABLET | Freq: Every day | ORAL | Status: DC
  Filled 2011-08-06: qty 1

## 2011-08-06 MED ORDER — METOPROLOL SUCCINATE ER 100 MG PO TB24
100.0000 mg | ORAL_TABLET | Freq: Every day | ORAL | Status: DC
  Administered 2011-08-07 – 2011-08-12 (×6): 100 mg via ORAL
  Filled 2011-08-06 (×7): qty 1

## 2011-08-06 MED ORDER — NITROGLYCERIN IN D5W 200-5 MCG/ML-% IV SOLN: 2.0000 ug/min | Freq: Once | INTRAVENOUS | Status: DC

## 2011-08-06 MED ORDER — HEPARIN (PORCINE) IN NACL 100-0.45 UNIT/ML-% IJ SOLN
12.0000 [IU]/kg/h | INTRAMUSCULAR | Status: DC
  Filled 2011-08-06: qty 250

## 2011-08-06 MED ORDER — METOPROLOL TARTRATE 1 MG/ML IV SOLN
5.0000 mg | Freq: Once | INTRAVENOUS | Status: AC
  Administered 2011-08-06: 5 mg via INTRAVENOUS
  Filled 2011-08-06: qty 5

## 2011-08-06 MED ORDER — HEPARIN BOLUS VIA INFUSION: 3400.0000 [IU] | INTRAVENOUS | Status: DC

## 2011-08-06 MED ORDER — HYDROCODONE-ACETAMINOPHEN 5-325 MG PO TABS
1.0000 | ORAL_TABLET | Freq: Two times a day (BID) | ORAL | Status: DC
  Administered 2011-08-07 – 2011-08-12 (×11): 1 via ORAL
  Filled 2011-08-06 (×11): qty 1

## 2011-08-06 MED ORDER — TICAGRELOR 90 MG PO TABS
180.0000 mg | ORAL_TABLET | Freq: Once | ORAL | Status: DC
  Filled 2011-08-06: qty 2

## 2011-08-06 MED ORDER — AMLODIPINE BESYLATE 10 MG PO TABS
10.0000 mg | ORAL_TABLET | Freq: Every day | ORAL | Status: DC
  Administered 2011-08-07 – 2011-08-12 (×6): 10 mg via ORAL
  Filled 2011-08-06 (×7): qty 1

## 2011-08-06 MED ORDER — NITROGLYCERIN 0.4 MG SL SUBL: 0.4000 mg | SUBLINGUAL_TABLET | SUBLINGUAL | Status: DC | PRN

## 2011-08-06 MED ORDER — PANTOPRAZOLE SODIUM 40 MG IV SOLR
40.0000 mg | Freq: Two times a day (BID) | INTRAVENOUS | Status: DC
  Administered 2011-08-06 – 2011-08-08 (×4): 40 mg via INTRAVENOUS
  Filled 2011-08-06 (×5): qty 40

## 2011-08-06 MED ORDER — ONDANSETRON HCL 4 MG PO TABS: 4.0000 mg | ORAL_TABLET | Freq: Four times a day (QID) | ORAL | Status: DC | PRN

## 2011-08-06 MED ORDER — SODIUM CHLORIDE 0.9 % IJ SOLN
3.0000 mL | Freq: Two times a day (BID) | INTRAMUSCULAR | Status: DC
  Administered 2011-08-07 – 2011-08-12 (×7): 3 mL via INTRAVENOUS

## 2011-08-06 MED ORDER — MORPHINE SULFATE 2 MG/ML IJ SOLN
1.0000 mg | INTRAMUSCULAR | Status: DC | PRN
  Administered 2011-08-07: 1 mg via INTRAVENOUS
  Filled 2011-08-06: qty 1

## 2011-08-06 MED ORDER — LORAZEPAM 0.5 MG PO TABS: 0.5000 mg | ORAL_TABLET | Freq: Every day | ORAL | Status: DC

## 2011-08-06 MED ORDER — RAMIPRIL 10 MG PO CAPS
10.0000 mg | ORAL_CAPSULE | Freq: Every day | ORAL | Status: DC
  Administered 2011-08-08 – 2011-08-12 (×5): 10 mg via ORAL
  Filled 2011-08-06 (×7): qty 1

## 2011-08-06 NOTE — Progress Notes (Signed)
Patient Darlene [name], 78 year old while female arrived via EMS at ED trauma room 1 with chest pains.  Chaplain provided pastoral prayer and presence.  Patient expressed thanks for Chaplain's attention.  I will follow-up as needed.

## 2011-08-06 NOTE — ED Notes (Signed)
EDP Campos aware that pt troponin is 0.73, no new orders.

## 2011-08-06 NOTE — ED Provider Notes (Signed)
History     CSN: [id number]  Arrival date & time 08/06/11  1813   First MD Initiated Contact with Patient 08/06/11 1815      Chief Complaint  Patient presents with  . Chest Pain     The history is provided by the patient.   the patient has a history of coronary artery disease reports intermittent chest pain shortness of breath x1 week.  Her chest pain became severe today and she took aspirin and 3 nitroglycerin with some improvement in her symptoms.  She has a history of coronary artery disease and is followed by Dr. Jacinto Halim.  She denies syncope.  She reports some lightheadedness.  Upon further history after some of her labs returned it does sound as though she's had some intermittent blood in her stool over the past week and half.  She does have a history of diverticulosis.  She denies melena.  She denies hematemesis.  She's had no nausea and vomiting.  She denies fevers and chills she denies unilateral leg swelling.  No prior history of PE or DVT.  She is not on anticoagulants.  Past Medical History  Diagnosis Date  . UTI (urinary tract infection)   . IBS (irritable bowel syndrome)   . Abdominal pain, left lower quadrant   . CAD (coronary artery disease)   . Pneumonia   . HTN (hypertension)   . Hyperlipidemia   . Diverticulosis of colon   . History of colon cancer   . Allergic rhinitis     Past Surgical History  Procedure Date  . Colectomy   . Hemicolectomy 1991    Right  . Angioplasty     Stent x 2  . Appendectomy   . Bilateral salpingoophorectomy   . Breast surgery     benign  . Orif hip fracture 12/2009    Dr Charlann Boxer    Family History  Problem Relation Age of Onset  . Colon cancer Neg Hx   . Breast cancer Neg Hx     History  Substance Use Topics  . Smoking status: Never Smoker   . Smokeless tobacco: Not on file  . Alcohol Use: No    OB History    Grav Para Term Preterm Abortions TAB SAB Ect Mult Living                  Review of Systems  All other  systems reviewed and are negative.    Allergies  Antihistamines, chlorpheniramine-type  Home Medications   Current Outpatient Rx  Name Route Sig Dispense Refill  . ALPRAZOLAM 1 MG PO TABS Oral Take 1 tablet (1 mg total) by mouth as directed. 30 tablet 5  . AMLODIPINE BESYLATE 10 MG PO TABS Oral Take 10 mg by mouth daily.      . ASPIRIN 325 MG PO TABS Oral Take 325 mg by mouth daily.      . CHLORHEXIDINE GLUCONATE 0.12 % MT SOLN  USE MOUTHWASH AS DIRECTED FOR SORE GUMS 473 mL 0  . DICYCLOMINE HCL 10 MG PO CAPS Oral Take 10-20 mg by mouth every 6 (six) hours as needed. For abdominal pain     . ESOMEPRAZOLE MAGNESIUM 40 MG PO CPDR Oral Take 1 capsule (40 mg total) by mouth 2 (two) times daily. 60 capsule 4  . HYDROCODONE-ACETAMINOPHEN 5-325 MG PO TABS Oral Take 1 tablet by mouth 2 (two) times daily. For pain 60 tablet 3  . LORAZEPAM 0.5 MG PO TABS Oral Take 1 tablet (0.5  mg total) by mouth at bedtime. 30 tablet 5  . METOPROLOL SUCCINATE ER 100 MG PO TB24 Oral Take 100 mg by mouth daily.      . SUPER HIGH VITAMINS/MINERALS PO TABS Oral Take 1 tablet by mouth daily.      Marland Kitchen NITROGLYCERIN 0.4 MG SL SUBL Sublingual Place 0.4 mg under the tongue as needed.      Marland Kitchen PRAVASTATIN SODIUM 80 MG PO TABS Oral Take 80 mg by mouth daily.      Marland Kitchen RAMIPRIL 10 MG PO TABS Oral Take 10 mg by mouth daily.      Marland Kitchen TERAZOSIN HCL 1 MG PO CAPS Oral Take 1 mg by mouth daily.      . TRIAMCINOLONE ACETONIDE 0.1 % EX CREA Topical Apply topically 2 (two) times daily. 30 g 3    BP 113/60  Pulse 90  Temp(Src) 99.6 F (37.6 C) (Oral)  Resp 18  Ht 5\' 5"  (1.651 m)  Wt 125 lb (56.7 kg)  BMI 20.80 kg/m2  SpO2 97%  Physical Exam  Nursing note and vitals reviewed. Constitutional: She is oriented to person, place, and time. She appears well-developed and well-nourished. No distress.  HENT:  Head: Normocephalic and atraumatic.  Eyes: EOM are normal.  Neck: Normal range of motion.  Cardiovascular: Normal rate, regular  rhythm and normal heart sounds.   Pulmonary/Chest: Effort normal and breath sounds normal.  Abdominal: Soft. She exhibits no distension. There is no tenderness. There is no rebound and no guarding.  Genitourinary:       Gross blood on rectal exam.  No active bleeding from her rectum.  Stool color brown.  Musculoskeletal: Normal range of motion.  Neurological: She is alert and oriented to person, place, and time.  Skin: Skin is warm and dry.  Psychiatric: She has a normal mood and affect. Judgment normal.    ED Course  Procedures (including critical care time)  CRITICAL CARE Performed by: Lyanne Co Total critical care time: 35 Critical care time was exclusive of separately billable procedures and treating other patients. Critical care was necessary to treat or prevent imminent or life-threatening deterioration. Critical care was time spent personally by me on the following activities: development of treatment plan with patient and/or surrogate as well as nursing, discussions with consultants, evaluation of patient's response to treatment, examination of patient, obtaining history from patient or surrogate, ordering and performing treatments and interventions, ordering and review of laboratory studies, ordering and review of radiographic studies, pulse oximetry and re-evaluation of patient's condition.   Date: 08/06/2011  Rate: 114  Rhythm: normal sinus rhythm  QRS Axis: normal  Intervals: normal  ST/T Wave abnormalities: Inferior lateral ST depression  Conduction Disutrbances: none  Narrative Interpretation:   Old EKG Reviewed: Change from prior EKG.  ST depression is new    Labs Reviewed  CBC - Abnormal; Notable for the following:    WBC 17.7 (*)    RBC 2.93 (*)    Hemoglobin 7.0 (*)    HCT 22.6 (*)    MCV 77.1 (*)    MCH 23.9 (*)    All other components within normal limits  COMPREHENSIVE METABOLIC PANEL - Abnormal; Notable for the following:    Sodium 125 (*)     Chloride 89 (*)    Glucose, Bld 135 (*)    Creatinine, Ser 1.37 (*)    GFR calc non Af Amer 33 (*)    GFR calc Af Amer 39 (*)    All  other components within normal limits  TROPONIN I - Abnormal; Notable for the following:    Troponin I 0.73 (*)    All other components within normal limits  POCT I-STAT, CHEM 8 - Abnormal; Notable for the following:    Sodium 125 (*)    Chloride 95 (*)    Creatinine, Ser 1.40 (*)    Glucose, Bld 142 (*)    Calcium, Ion 1.09 (*)    Hemoglobin 8.2 (*)    HCT 24.0 (*)    All other components within normal limits  PROTIME-INR  POCT I-STAT TROPONIN I  POCT I-STAT TROPONIN I  PREPARE RBC (CROSSMATCH)  TYPE AND SCREEN   No results found.   1. Acute coronary syndrome    2. GI bleed   MDM  The patient presents with symptoms typical of unstable angina and presents with diffuse inferolateral ST depression concerning for ischemic changes.  She has nothing at this time to suggest ST elevation MI.  Code STEMI was canceled.  I spoke with her cardiologist Dr. Newt Lukes who evaluated the patient at the bedside and the initial plan was for him to admit the patient.  He wrote the patient to be started on anticoagulation which I agree with.  The patient's labs returned showing hemoglobin of 7.  This is a 3 g drop in her hemoglobin in the past 9 months.  Upon further history it appears the patient has had some lower GI bleeding symptoms over the past 2 weeks.  At this time she'll be transfused 3 units of packed red blood cells.  The patient was not started on any other anticoagulants.  She will need to be admitted to the medicine service at this time.  Cardiology we'll reconsult them.  I suspect that she is transfused and her hemoglobin is brought back up for ischemic and anginal symptoms will resolve.        Lyanne Co, MD 08/06/11 [phone]

## 2011-08-06 NOTE — ED Notes (Signed)
Admitting MD Garba at bedside to evaluate pt.

## 2011-08-06 NOTE — Consult Note (Addendum)
ANTICOAGULATION CONSULT NOTE - Initial Consult  Pharmacy Consult for : Heparin Indication: chest pain/ACS  Allergies  Allergen Reactions  . Antihistamines, Chlorpheniramine-Type     All Antihistamines.    Patient Measurements: Height: 5\' 5"  (165.1 cm) Weight: 125 lb (56.7 kg) IBW/kg (Calculated) : 57   Vital Signs: Temp: 99.6 F (37.6 C) (04/27 1830) Temp src: Oral (04/27 1830) BP: 113/60 mmHg (04/27 1902) Pulse Rate: 90  (04/27 1902)  Labs:  Basename 08/06/11 1843 08/06/11 1840  HGB 8.2* 7.0*  HCT 24.0* 22.6*  PLT -- 209  APTT -- --  LABPROT -- --  INR -- --  HEPARINUNFRC -- --  CREATININE 1.40* --  CKTOTAL -- --  CKMB -- --  TROPONINI -- --   Estimated Creatinine Clearance: 24.9 ml/min (by C-G formula based on Cr of 1.4).  Medical / Surgical History: Past Medical History  Diagnosis Date  . UTI (urinary tract infection)   . IBS (irritable bowel syndrome)   . Abdominal pain, left lower quadrant   . CAD (coronary artery disease)   . Pneumonia   . HTN (hypertension)   . Hyperlipidemia   . Diverticulosis of colon   . History of colon cancer   . Allergic rhinitis    Past Surgical History  Procedure Date  . Colectomy   . Hemicolectomy 1991    Right  . Angioplasty     Stent x 2  . Appendectomy   . Bilateral salpingoophorectomy   . Breast surgery     benign  . Orif hip fracture 12/2009    Dr Charlann Boxer    Medications: No current facility-administered medications on file prior to encounter.   Current Outpatient Prescriptions on File Prior to Encounter  Medication Sig Dispense Refill  . ALPRAZolam (XANAX) 1 MG tablet Take 1 tablet (1 mg total) by mouth as directed.  30 tablet  5  . amLODipine (NORVASC) 10 MG tablet Take 10 mg by mouth daily.        Marland Kitchen aspirin 325 MG tablet Take 325 mg by mouth daily.        . chlorhexidine (PERIDEX) 0.12 % solution USE MOUTHWASH AS DIRECTED FOR SORE GUMS  473 mL  0  . dicyclomine (BENTYL) 10 MG capsule Take 10-20 mg by  mouth every 6 (six) hours as needed. For abdominal pain       . esomeprazole (NEXIUM) 40 MG capsule Take 1 capsule (40 mg total) by mouth 2 (two) times daily.  60 capsule  4  . HYDROcodone-acetaminophen (NORCO) 5-325 MG per tablet Take 1 tablet by mouth 2 (two) times daily. For pain  60 tablet  3  . LORazepam (ATIVAN) 0.5 MG tablet Take 1 tablet (0.5 mg total) by mouth at bedtime.  30 tablet  5  . metoprolol (TOPROL-XL) 100 MG 24 hr tablet Take 100 mg by mouth daily.        . Multiple Vitamins-Minerals (MULTIVITAMIN,TX-MINERALS) tablet Take 1 tablet by mouth daily.        . nitroGLYCERIN (NITROSTAT) 0.4 MG SL tablet Place 0.4 mg under the tongue as needed.        . pravastatin (PRAVACHOL) 80 MG tablet Take 80 mg by mouth daily.        . ramipril (ALTACE) 10 MG tablet Take 10 mg by mouth daily.        Marland Kitchen terazosin (HYTRIN) 1 MG capsule Take 1 mg by mouth daily.        Marland Kitchen triamcinolone cream (KENALOG) 0.1 %  Apply topically 2 (two) times daily.  30 g  3      (Not in a hospital admission) Scheduled:    . heparin  3,400 Units Intravenous STAT  . metoprolol  5 mg Intravenous Once  . Ticagrelor  180 mg Oral Once  . DISCONTD: nitroGLYCERIN  2-200 mcg/min Intravenous Once    Assessment:  78 year old Caucasian female with history of known coronary artery disease and history of remote inferior wall myocardial infarction and stenting to her right coronary artery.   EKG reveals significant ST-T wave changes suggesting a subendocardial ischemia versus infarct.  Heparin per protocol initiated.  Goal of Therapy:   Heparin level 0.3-0.7 units/ml   Plan:   Heparin bolus 3400 units IV now, then begin Heparin infusion at 12 units/kg/hr (700 units/hr).  Stat Heparin level, CBC in 8 hours, then daily.  Trever Streater, Elisha Headland, Pharm.D. 08/06/2011 7:08 PM  ADDENDUM: Heparin bolus and infusion discontinued due to falling H/H. Patient to receive blood transfusion, then re-evaluation. Please re-consult  if anything further is required.  Thank you.  Rheba Diamond, Elisha Headland, Pharm.D. 08/06/2011 7:21 PM

## 2011-08-06 NOTE — H&P (Addendum)
Darlene [name] is an 78 y.o. female.   Chief Complaint: Chest pain HPI: Patient is a 78 year old Caucasian female with history of known coronary artery disease and history of remote inferior wall myocardial infarction and stenting to her right coronary artery was doing well until about a week ago has been having recurrent chest pain. She was seen by her PCP but felt to be atypical. However this evening the chest pain got severe and is associated with marked diaphoresis and shortness of breath. EMS was activated and she was brought directly to the emergency room. In the emergency department she was found to have significant ST-T wave changes to suggest subendocardial ischemia versus infarct. I was called to see the patient. Patient is still having chest pressure but states that she is feeling slightly better. She states that her breathing has also got better. She's not had any PND and orthopnea in the last one week. No palpitations, no TIA-like symptoms, no dizziness or syncope.  Past Medical History  Diagnosis Date  . UTI (urinary tract infection)   . IBS (irritable bowel syndrome)   . Abdominal pain, left lower quadrant   . CAD (coronary artery disease)   . Pneumonia   . HTN (hypertension)   . Hyperlipidemia   . Diverticulosis of colon   . History of colon cancer   . Allergic rhinitis     Past Surgical History  Procedure Date  . Colectomy   . Hemicolectomy 1991    Right  . Angioplasty     Stent x 2  . Appendectomy   . Bilateral salpingoophorectomy   . Breast surgery     benign  . Orif hip fracture 12/2009    Dr Darlene [name]    Family History  Problem Relation Age of Onset  . Colon cancer Neg Hx   . Breast cancer Neg Hx    Social History:  reports that she has never smoked. She does not have any smokeless tobacco history on file. She reports that she does not drink alcohol or use illicit drugs.  Allergies:  Allergies  Allergen Reactions  . Antihistamines, Chlorpheniramine-Type      All Antihistamines.    (ROS: Arthritis Yes, mild in the leg, life-style limiting NO;  Cramping of the legs and feet at night or with activity NO; Diabetes NO,  Controlled NA;  Hypothyroidism NO;  Previous Stroke/TIA NO;  Previous GI Bleed NO ;  Erectile dysfunctionNA ; Recent weight change NO ; Symptoms of obstructive sleep apneaNO;   Patient has poor dentition and needs tooth extraction.  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 Other systems negative.)  Blood pressure 125/60, pulse 113, temperature 99.6 F (37.6 C), temperature source Oral, resp. rate 14, height 5\' 5"  (1.651 m), weight 56.7 kg (125 lb), SpO2 94.00%.  General appearance: alert, cooperative, appears stated age and no distress Eyes: conjunctivae/corneas clear. PERRL, EOM's intact. Fundi benign. Neck: no adenopathy, no carotid bruit, no JVD, supple, symmetrical, trachea midline and thyroid not enlarged, symmetric, no tenderness/mass/nodules Neck: JVP - normal, carotids 2+= without bruits Resp: rales bilaterally Chest wall: no tenderness Cardio: S1, S2 normal and S4 present GI: soft, non-tender; bowel sounds normal; no masses,  no organomegaly Extremities: extremities normal, atraumatic, no cyanosis or edema Pulses: 2+ and symmetric Skin: Skin color, texture, turgor normal. No rashes or lesions Neurologic: Alert and oriented X 3, normal strength and tone. Normal  symmetric reflexes. Normal coordination and gait   Assessment/Plan  1. Non-ST elevation myocardial infarction. I be very surprised if the cardiac markers will come back negative. 2.  CAD/ASHD:  Inferior MI s/p stent RCA 1989, 70% Mid LAD managed medically.    ECG 01/31/11: S. Tachycardia @ 108/ minute  IVCD. Inferior MI old.  Today: 4 mm ST depression in inferior and lateral leads. 2.   Hyperlipidemia.   Controlled.   Labs done on 05/28/2010 had revealed BUN 18, serum  creatinine 1.08 with estimated GFR 46 mL. Labs pending today. 05/28/2010 Total cholesterol was 171, triglycerides 116, HDL 61 and LDL was 87.  Non-HDL cholesterol is 110. Vitamin D level was 20.7 ng.  3.   Hypertension controlled.  Recommendation: I will take her to the clinic Is lab on an urgent basis due to marked EKG changes and presence of chest pressure. I will make further recommendations. Patient does not want CABG. Given her advanced age we will have to be extremely careful and she is at high risk for bleeding complications and procedure complications. Patient is a long-term patient of mine and was very happy to see me in the emergency department. All questions were answered.  Darlene Pert, MD 08/06/2011, 6:49 PM     Patient lab work reveal severe anemia and she is having active GI bleed. She had bright red blood on rectal examination. Hence the cardiac catheterization will be canceled. Patient will be transfused. I will continue to follow the patient. Patient will be admitted by general medicine.

## 2011-08-06 NOTE — ED Notes (Signed)
Pre Cath Lab orders cancelled per [facility].

## 2011-08-06 NOTE — ED Notes (Signed)
Attempted to call report. RN unable to take at this time. 

## 2011-08-06 NOTE — H&P (Signed)
Darlene [name] is an 78 y.o. female.   Chief Complaint: Chest pain HPI: Is an 78 year old female with multiple medical problems including known colon cancer and diverticulosis of colon also has known history of coronary artery disease status post stenting. It has been having on and off chest pain for about a week she was seen by her primary-care physician and was evaluated but at the time she seemed to have atypical chest pain. She decided to come to the emergency room today after the chest pain escalated and was radiating down her left arm. Rated at 7/10, relieved in the ER with nitroglycerin. Had some ST-T wave depression on the lateral side on the EKG. Cardiology was consulted and patient has been seen by Dr. Jacinto Halim. She was being treated for an is non-ST elevation MI of her high hemoglobin was noted to have dropped to 7.0. Previous hemoglobin was 10.0. Denied melena or bright red blood per rectum been having on and off bleeds from diverticulosis. No abdominal pain, no nausea or or vomiting.   Past Medical History  Diagnosis Date  . UTI (urinary tract infection)   . IBS (irritable bowel syndrome)   . Abdominal pain, left lower quadrant   . CAD (coronary artery disease)   . Pneumonia   . HTN (hypertension)   . Hyperlipidemia   . Diverticulosis of colon   . History of colon cancer   . Allergic rhinitis     Past Surgical History  Procedure Date  . Colectomy   . Hemicolectomy 1991    Right  . Angioplasty     Stent x 2  . Appendectomy   . Bilateral salpingoophorectomy   . Breast surgery     benign  . Orif hip fracture 12/2009    Dr Charlann Boxer    Family History  Problem Relation Age of Onset  . Colon cancer Neg Hx   . Breast cancer Neg Hx    Social History:  reports that she has never smoked. She does not have any smokeless tobacco history on file. She reports that she does not drink alcohol or use illicit drugs.  Allergies:  Allergies  Allergen Reactions  . Antihistamines,  Chlorpheniramine-Type     All Antihistamines.     (Not in a hospital admission)  Results for orders placed during the hospital encounter of 08/06/11 (from the past 48 hour(s))  CBC     Status: Abnormal   Collection Time   08/06/11  6:40 PM      Component Value Range Comment   WBC 17.7 (*) 4.0 - 10.5 (K/uL)    RBC 2.93 (*) 3.87 - 5.11 (MIL/uL)    Hemoglobin 7.0 (*) 12.0 - 15.0 (g/dL)    HCT 62.1 (*) 30.8 - 46.0 (%)    MCV 77.1 (*) 78.0 - 100.0 (fL)    MCH 23.9 (*) 26.0 - 34.0 (pg)    MCHC 31.0  30.0 - 36.0 (g/dL)    RDW 65.7  84.6 - 96.2 (%)    Platelets 209  150 - 400 (K/uL)   COMPREHENSIVE METABOLIC PANEL     Status: Abnormal   Collection Time   08/06/11  6:40 PM      Component Value Range Comment   Sodium 125 (*) 135 - 145 (mEq/L)    Potassium 4.4  3.5 - 5.1 (mEq/L)    Chloride 89 (*) 96 - 112 (mEq/L)    CO2 21  19 - 32 (mEq/L)    Glucose, Bld 135 (*) 70 -  99 (mg/dL)    BUN 18  6 - 23 (mg/dL)    Creatinine, Ser 3.38 (*) 0.50 - 1.10 (mg/dL)    Calcium 9.2  8.4 - 10.5 (mg/dL)    Total Protein 6.8  6.0 - 8.3 (g/dL)    Albumin 3.5  3.5 - 5.2 (g/dL)    AST 26  0 - 37 (U/L) HEMOLYSIS AT THIS LEVEL MAY AFFECT RESULT   ALT 10  0 - 35 (U/L)    Alkaline Phosphatase 80  39 - 117 (U/L)    Total Bilirubin 0.6  0.3 - 1.2 (mg/dL)    GFR calc non Af Amer 33 (*) >90 (mL/min)    GFR calc Af Amer 39 (*) >90 (mL/min)   TROPONIN I     Status: Abnormal   Collection Time   08/06/11  6:40 PM      Component Value Range Comment   Troponin I 0.73 (*) <0.30 (ng/mL)   PROTIME-INR     Status: Normal   Collection Time   08/06/11  6:40 PM      Component Value Range Comment   Prothrombin Time 14.1  11.6 - 15.2 (seconds)    INR 1.07  0.00 - 1.49    POCT I-STAT TROPONIN I     Status: Normal   Collection Time   08/06/11  6:41 PM      Component Value Range Comment   Troponin i, poc 0.02  0.00 - 0.08 (ng/mL)    Comment 3            POCT I-STAT, CHEM 8     Status: Abnormal   Collection Time    08/06/11  6:43 PM      Component Value Range Comment   Sodium 125 (*) 135 - 145 (mEq/L)    Potassium 4.3  3.5 - 5.1 (mEq/L)    Chloride 95 (*) 96 - 112 (mEq/L)    BUN 19  6 - 23 (mg/dL)    Creatinine, Ser 2.50 (*) 0.50 - 1.10 (mg/dL)    Glucose, Bld 539 (*) 70 - 99 (mg/dL)    Calcium, Ion 7.67 (*) 1.12 - 1.32 (mmol/L)    TCO2 24  0 - 100 (mmol/L)    Hemoglobin 8.2 (*) 12.0 - 15.0 (g/dL)    HCT 34.1 (*) 93.7 - 46.0 (%)   POCT I-STAT TROPONIN I     Status: Normal   Collection Time   08/06/11  7:12 PM      Component Value Range Comment   Troponin i, poc 0.02  0.00 - 0.08 (ng/mL)    Comment 3            OCCULT BLOOD, POC DEVICE     Status: Normal   Collection Time   08/06/11  7:31 PM      Component Value Range Comment   Fecal Occult Bld POSITIVE     PREPARE RBC (CROSSMATCH)     Status: Normal   Collection Time   08/06/11  8:05 PM      Component Value Range Comment   Order Confirmation ORDER PROCESSED BY BLOOD BANK     TYPE AND SCREEN     Status: Normal (Preliminary result)   Collection Time   08/06/11  8:05 PM      Component Value Range Comment   ABO/RH(D) A POS      Antibody Screen NEG      Sample Expiration 08/09/2011      Unit Number [id number]  Blood Component Type RED CELLS,LR      Unit division 00      Status of Unit ALLOCATED      Transfusion Status OK TO TRANSFUSE      Crossmatch Result Compatible      Unit Number [id number]      Blood Component Type RED CELLS,LR      Unit division 00      Status of Unit ALLOCATED      Transfusion Status OK TO TRANSFUSE      Crossmatch Result Compatible      Unit Number [id number]      Blood Component Type RED CELLS,LR      Unit division 00      Status of Unit ALLOCATED      Transfusion Status OK TO TRANSFUSE      Crossmatch Result Compatible     ABO/RH     Status: Normal (Preliminary result)   Collection Time   08/06/11  8:05 PM      Component Value Range Comment   ABO/RH(D) A POS      No results found.  Review of Systems    Constitutional: Negative.   HENT: Negative.   Eyes: Negative.   Respiratory: Positive for shortness of breath. Negative for cough, hemoptysis, sputum production and wheezing.   Cardiovascular: Positive for chest pain, orthopnea and PND.  Gastrointestinal: Positive for blood in stool. Negative for heartburn, nausea, diarrhea, constipation and melena.  Genitourinary: Negative.   Musculoskeletal: Negative.   Skin: Negative.   Neurological: Negative.   Psychiatric/Behavioral: Negative.     Blood pressure 113/60, pulse 90, temperature 99.6 F (37.6 C), temperature source Oral, resp. rate 18, height 5\' 5"  (1.651 m), weight 56.7 kg (125 lb), SpO2 97.00%. Physical Exam  Constitutional: She is oriented to person, place, and time. She appears well-developed and well-nourished.  HENT:  Head: Normocephalic and atraumatic.  Right Ear: External ear normal.  Left Ear: External ear normal.  Nose: Nose normal.  Mouth/Throat: Oropharynx is clear and moist.       Decreased hearing  Eyes: Conjunctivae and EOM are normal. Pupils are equal, round, and reactive to light.  Neck: Normal range of motion. Neck supple.  Cardiovascular: Normal rate, regular rhythm, normal heart sounds and intact distal pulses.   Respiratory: Effort normal and breath sounds normal.  GI: Soft. Bowel sounds are normal. She exhibits no distension. There is no tenderness. There is no rebound.  Musculoskeletal: Normal range of motion.  Neurological: She is alert and oriented to person, place, and time. She has normal reflexes.  Skin: Skin is warm and dry.  Psychiatric: She has a normal mood and affect. Her behavior is normal. Judgment and thought content normal.     Assessment/Plan Assessment this is an 78 year old female presenting with non-ST elevation MI secondary to severe anemia in a patient with known coronary artery disease. Plan #1: non-ST elevation MI: She'll be admitted to step down unit, she is receiving nitroglycerin  aspirin and morphine however with the GI bleed we will hold the aspirin. No anticoagulation, we will treat her with blood transfusion which should treat the MI. #2 severe anemia: Most likely this is secondary to GI bleed which could be slow however a her hemoglobin has dropped 3 g in 3 weeks. Check stool guaiacs, transfuse 2 units of red blood cells and also consult GI. We will also put on her on IV Protonix and check serial CBCs. #3 hypertension: We will be careful in the setting  of GI bleed not to bottom out her blood pressure. We will therefore some of her blood pressure medications except for the beta blocker. #4 hyperlipidemia: Continue statin #5 Coronary artery disease: We will continue as in #1  Jenalyn Girdner,LAWAL 08/06/2011, 9:07 PM

## 2011-08-06 NOTE — ED Notes (Signed)
Patient with chest pain and sob intermittently x 1 week.  Patient alert and oriented.  Patient received aspirin 324 mg and nitro x 1 with some decrease.  Patient with no n/v  No dizziness. Patient with hx of mi.

## 2011-08-06 NOTE — ED Notes (Signed)
Error in charting, blood continues at 130ml/hr

## 2011-08-06 NOTE — ED Notes (Signed)
Pt c/o intermittent mid sternum chest pain x2 weeks, sob, and dizziness. Pt reports increase chest pain w/activity, pain is resolved w/rest and nitro

## 2011-08-07 ENCOUNTER — Inpatient Hospital Stay (HOSPITAL_COMMUNITY): Payer: Medicare Other

## 2011-08-07 DIAGNOSIS — D649 Anemia, unspecified: Secondary | ICD-10-CM

## 2011-08-07 DIAGNOSIS — K922 Gastrointestinal hemorrhage, unspecified: Secondary | ICD-10-CM | POA: Diagnosis present

## 2011-08-07 DIAGNOSIS — I200 Unstable angina: Secondary | ICD-10-CM

## 2011-08-07 LAB — CBC
Hemoglobin: 11.7 g/dL — ABNORMAL LOW (ref 12.0–15.0)
MCHC: 32.5 g/dL (ref 30.0–36.0)
Platelets: 172 10*3/uL (ref 150–400)
RBC: 4.48 MIL/uL (ref 3.87–5.11)

## 2011-08-07 LAB — BASIC METABOLIC PANEL
GFR calc Af Amer: 49 mL/min — ABNORMAL LOW (ref >90)
GFR calc non Af Amer: 43 mL/min — ABNORMAL LOW (ref >90)
Glucose, Bld: 132 mg/dL — ABNORMAL HIGH (ref 70–99)
Potassium: 3.9 mEq/L (ref 3.5–5.1)
Sodium: 130 mEq/L — ABNORMAL LOW (ref 135–145)

## 2011-08-07 LAB — CARDIAC PANEL(CRET KIN+CKTOT+MB+TROPI)
Troponin I: 0.39 ng/mL (ref <0.30)
Troponin I: 0.6 ng/mL (ref <0.30)

## 2011-08-07 LAB — PRO B NATRIURETIC PEPTIDE: Pro B Natriuretic peptide (BNP): 7119 pg/mL — ABNORMAL HIGH (ref 0–450)

## 2011-08-07 LAB — PREPARE RBC (CROSSMATCH)

## 2011-08-07 LAB — MRSA PCR SCREENING: MRSA by PCR: NEGATIVE

## 2011-08-07 MED ORDER — FUROSEMIDE 10 MG/ML IJ SOLN: 20.0000 mg | Freq: Once | INTRAMUSCULAR | Status: DC

## 2011-08-07 MED ORDER — ALPRAZOLAM 0.5 MG PO TABS
1.0000 mg | ORAL_TABLET | Freq: Two times a day (BID) | ORAL | Status: DC | PRN
  Administered 2011-08-07: 1 mg via ORAL
  Filled 2011-08-07: qty 2

## 2011-08-07 MED ORDER — FUROSEMIDE 10 MG/ML IJ SOLN: 20.0000 mg | Freq: Two times a day (BID) | INTRAMUSCULAR | Status: DC

## 2011-08-07 MED ORDER — FUROSEMIDE 10 MG/ML IJ SOLN
INTRAMUSCULAR | Status: AC
  Administered 2011-08-07: 20 mg via INTRAVENOUS
  Filled 2011-08-07: qty 4

## 2011-08-07 MED ORDER — SODIUM CHLORIDE 0.9 % IJ SOLN
INTRAMUSCULAR | Status: AC
  Administered 2011-08-07: 22:00:00
  Filled 2011-08-07: qty 10

## 2011-08-07 MED ORDER — FUROSEMIDE 10 MG/ML IJ SOLN
20.0000 mg | Freq: Two times a day (BID) | INTRAMUSCULAR | Status: DC
  Administered 2011-08-08 – 2011-08-09 (×3): 20 mg via INTRAVENOUS
  Filled 2011-08-07 (×6): qty 2

## 2011-08-07 MED ORDER — SODIUM CHLORIDE 0.9 % IJ SOLN
INTRAMUSCULAR | Status: AC
  Administered 2011-08-07: 10 mL
  Filled 2011-08-07: qty 10

## 2011-08-07 MED ORDER — LORAZEPAM 0.5 MG PO TABS
0.5000 mg | ORAL_TABLET | Freq: Four times a day (QID) | ORAL | Status: DC | PRN
  Administered 2011-08-07 – 2011-08-09 (×2): 0.5 mg via ORAL
  Filled 2011-08-07 (×2): qty 1

## 2011-08-07 MED ORDER — FUROSEMIDE 10 MG/ML IJ SOLN
20.0000 mg | Freq: Once | INTRAMUSCULAR | Status: AC
  Administered 2011-08-07: 20 mg via INTRAVENOUS

## 2011-08-07 MED ORDER — FUROSEMIDE 10 MG/ML IJ SOLN
40.0000 mg | Freq: Once | INTRAMUSCULAR | Status: AC
  Administered 2011-08-07: 40 mg via INTRAVENOUS
  Filled 2011-08-07: qty 4

## 2011-08-07 MED ORDER — SERTRALINE HCL 50 MG PO TABS
50.0000 mg | ORAL_TABLET | Freq: Every day | ORAL | Status: DC
  Administered 2011-08-07 – 2011-08-12 (×6): 50 mg via ORAL
  Filled 2011-08-07 (×7): qty 1

## 2011-08-07 MED ORDER — ASPIRIN 81 MG PO CHEW
81.0000 mg | CHEWABLE_TABLET | Freq: Every day | ORAL | Status: DC
  Administered 2011-08-07 – 2011-08-12 (×6): 81 mg via ORAL
  Filled 2011-08-07 (×6): qty 1

## 2011-08-07 NOTE — Progress Notes (Signed)
Subjective: Patient last seen 07/06/11 for mild SOB and atypical chest pain. She had progressive c/p, presented to ED and dx'd with nonstemi MI. Lab did reveal Hgb of 7.0 g, down from baseline of 10. This may have been a precipitating factor for her chest pain. She does have a h/o colon cancer with colectomy in '91. She has not reports any gross bleeding. Since admission she has had 3 units of blood with Hgb 11.7,  4th unit of blood in process. There is a question of mild fluid overload, perhaps related to transfusions. 2 D echo is pending.  Darlene [name] is sitting up eating lunch. She reports no chest pain. She does appear SOB. She does admit to frequent blood spotting on her underclothes and with BM but no large amount of bleeding.  Objective: Lab: Lab Results  Component Value Date   WBC 14.0* 08/07/2011   HGB 11.7* 08/07/2011   HCT 36.0 08/07/2011   MCV 80.4 08/07/2011   PLT 172 08/07/2011   BMET    Component Value Date/Time   NA 125* 08/06/2011 1843   K 4.3 08/06/2011 1843   CL 95* 08/06/2011 1843   CO2 21 08/06/2011 1840   GLUCOSE 142* 08/06/2011 1843   BUN 19 08/06/2011 1843   CREATININE 1.40* 08/06/2011 1843   CALCIUM 9.2 08/06/2011 1840   GFRNONAA 33* 08/06/2011 1840   GFRAA 39* 08/06/2011 1840    Cardiac Panel (last 3 results)  Basename 08/07/11 0932 08/06/11 1840  CKTOTAL 182* --  CKMB 6.2* --  TROPONINI 0.39* 0.73*  RELINDX 3.4* --    Imaging: Portable CXR 08/07/11 IMPRESSION:  1. Mild CHF.  2. Left base opacity concerning for pneumonia.   Physical Exam: Filed Vitals:   08/07/11 1155  BP: 128/59  Pulse: 92  Temp: 98.2 F (36.8 C)  Resp: 26  Elderly white woman in no acute distress HEENT- basal cell carcinoma medial canthus OS; C&S clear, PERRLA Pulm - no increased WOB, bibasilar rales on exam w/o wheezing Cor - heart sounds distant but regular Abd- BS+, soft, no guarding or tenderness Neuor - A&O x 3     Assessment/Plan: 1. GI - patient with at least 3 unit  GI bleeds, most likely lower GI origin. After 3 uPRBCs Hgb 11.7 and she has received 4th unit. No sign of active bleeding Plan - will cancel additional 2 uPRBC  H/H q 8 starting 4 hours after transfusion  Further GI w/u per Dr. Arlyce Dice and associates  2. Cardaic - patient with known CAD. Evidence c/w Non-STEMI MI possibly related to acute anemia. At this point she does appear to be fluid overloaded. Plan Diagnostic evaluation per Dr. Jacinto Halim  Furosemide 40 mg IV now  F/u CXR in AM  Continue all present meds  3. Derm - patient with lesion medial canthus worrisome for basal cell cancer. She had surgery scheduled for her but cancelled secondary to poor communication. Plan  Post-hospital follow-up.  4. Anxiety - she has many worries: her health, the welfare of her adult son who lives with her and is dependent on her and her SS income. Plan -  sertraline 50 mg daily  Ativan 0.5 mg q6     Darlene [name] 08/07/2011, 12:41 PM

## 2011-08-07 NOTE — Consult Note (Signed)
Referring Provider: No ref. provider found Primary Care Physician:  [name], MD, MD Primary Gastroenterologist:  Dr.  [name] for Consultation:  *Anemia**  HPI: Darlene [name] is a 78 y.o. female *with history of colon CA 1991, status post right hemicolectomy, admitted with an acute MI and a microcytic anemia. Recent hemoglobin was 10. It was 7 on admission. Last colonoscopy in 2006**demonstrated diverticulosis and hemorrhoids. On occasion she has seen blood on the toilet paper. She denies change in bowel habits, abdominal pain, melanoma or frank hematochezia.  She takes an aspirin daily and is on Nexium. She was admitted with chest pain and was felt to have a non-ST MI.   Past Medical History  Diagnosis Date  . UTI (urinary tract infection)   . IBS (irritable bowel syndrome)   . Abdominal pain, left lower quadrant   . CAD (coronary artery disease)   . Pneumonia   . HTN (hypertension)   . Hyperlipidemia   . Diverticulosis of colon   . History of colon cancer   . Allergic rhinitis   . Angina   . Cancer   . Arthritis   . Shortness of breath   . Anemia   . Blood transfusion   . Anxiety     Past Surgical History  Procedure Date  . Colectomy   . Hemicolectomy 1991    Right  . Angioplasty     Stent x 2  . Appendectomy   . Bilateral salpingoophorectomy   . Breast surgery     benign  . Orif hip fracture 12/2009    Dr Charlann Boxer    Prior to Admission medications   Medication Sig Start Date End Date Taking? Authorizing Provider  amLODipine (NORVASC) 10 MG tablet Take 10 mg by mouth daily.     Yes Historical Provider, MD  aspirin 81 MG chewable tablet Chew 81 mg by mouth daily.   Yes Historical Provider, MD  esomeprazole (NEXIUM) 40 MG capsule Take 1 capsule (40 mg total) by mouth 2 (two) times daily. 03/18/11  Yes Jacques Navy, MD  ezetimibe (ZETIA) 10 MG tablet Take 10 mg by mouth daily.   Yes Historical Provider, MD  HYDROcodone-acetaminophen (NORCO) 5-325 MG per  tablet Take 1 tablet by mouth 2 (two) times daily. For pain 05/04/11  Yes Jacques Navy, MD  LORazepam (ATIVAN) 0.5 MG tablet Take 1 tablet (0.5 mg total) by mouth at bedtime. 07/01/11 06/30/12 Yes Jacques Navy, MD  metoprolol (TOPROL-XL) 100 MG 24 hr tablet Take 100 mg by mouth daily.     Yes Historical Provider, MD  Multiple Vitamins-Minerals (MULTIVITAMIN,TX-MINERALS) tablet Take 1 tablet by mouth daily.     Yes Historical Provider, MD  nitroGLYCERIN (NITROSTAT) 0.4 MG SL tablet Place 0.4 mg under the tongue every 5 (five) minutes as needed. For chest pain   Yes Historical Provider, MD  pravastatin (PRAVACHOL) 80 MG tablet Take 80 mg by mouth daily.     Yes Historical Provider, MD  ramipril (ALTACE) 10 MG tablet Take 10 mg by mouth daily.     Yes Historical Provider, MD  terazosin (HYTRIN) 1 MG capsule Take 1 mg by mouth daily.     Yes Historical Provider, MD    Current Facility-Administered Medications  Medication Dose Route Frequency Provider Last Rate Last Dose  . 0.9 %  sodium chloride infusion   Intravenous STAT Lyanne Co, MD      . 0.9 %  sodium chloride infusion   Intravenous Continuous Gwenyth Bouillon  Jerelyn Charles, MD 100 mL/hr at 08/06/11 2338    . amLODipine (NORVASC) tablet 10 mg  10 mg Oral Daily Rometta Emery, MD      . aspirin tablet 325 mg  325 mg Oral Daily Rometta Emery, MD      . dicyclomine (BENTYL) capsule 10-20 mg  10-20 mg Oral Q6H PRN Rometta Emery, MD      . furosemide (LASIX) injection 20 mg  20 mg Intravenous Once Georgann Housekeeper, MD   20 mg at 08/07/11 0743  . HYDROcodone-acetaminophen (NORCO) 5-325 MG per tablet 1 tablet  1 tablet Oral BID Rometta Emery, MD      . LORazepam (ATIVAN) tablet 0.5 mg  0.5 mg Oral QHS Rometta Emery, MD      . metoprolol (LOPRESSOR) injection 5 mg  5 mg Intravenous Once [name], MD   5 mg at 08/06/11 1857  . metoprolol succinate (TOPROL-XL) 24 hr tablet 100 mg  100 mg Oral Daily Rometta Emery, MD      . morphine 2  MG/ML injection 1 mg  1 mg Intravenous Q4H PRN Rometta Emery, MD   1 mg at 08/07/11 0331  . mulitivitamin with minerals tablet 1 tablet  1 tablet Oral Daily Rometta Emery, MD      . nitroGLYCERIN (NITROSTAT) SL tablet 0.4 mg  0.4 mg Sublingual PRN Rometta Emery, MD      . nitroGLYCERIN 0.2 mg/mL in dextrose 5 % infusion  10 mcg/min Intravenous Titrated [name], MD 3 mL/hr at 08/07/11 0600 10 mcg/min at 08/07/11 0600  . ondansetron (ZOFRAN) tablet 4 mg  4 mg Oral Q6H PRN Rometta Emery, MD       Or  . ondansetron (ZOFRAN) injection 4 mg  4 mg Intravenous Q6H PRN Rometta Emery, MD      . pantoprazole (PROTONIX) injection 40 mg  40 mg Intravenous Q12H Rometta Emery, MD   40 mg at 08/06/11 2217  . ramipril (ALTACE) capsule 10 mg  10 mg Oral Daily Rometta Emery, MD      . simvastatin (ZOCOR) tablet 5 mg  5 mg Oral q1800 Rometta Emery, MD      . sodium chloride 0.9 % injection 3 mL  3 mL Intravenous Q12H Rometta Emery, MD      . DISCONTD: heparin ADULT infusion 100 units/mL (25000 units/250 mL)  12 Units/kg/hr Intravenous Continuous Lyanne Co, MD      . DISCONTD: heparin bolus via infusion 3,400 Units  3,400 Units Intravenous STAT Lyanne Co, MD      . DISCONTD: nitroGLYCERIN 0.2 mg/mL in dextrose 5 % infusion  2-200 mcg/min Intravenous Once Lyanne Co, MD      . DISCONTD: Ticagrelor (BRILINTA) tablet 180 mg  180 mg Oral Once [name], MD        Allergies as of 08/06/2011 - Review Complete 08/06/2011  Allergen Reaction Noted  . Antihistamines, chlorpheniramine-type  07/06/2011    Family History  Problem Relation Age of Onset  . Colon cancer Neg Hx   . Breast cancer Neg Hx     History   Social History  . Marital Status: Legally Separated    Spouse Name: N/A    Number of Children: N/A  . Years of Education: N/A   Occupational History  . Retired    Social History Main Topics  . Smoking status: Never Smoker   .  Smokeless  tobacco: Not on file  . Alcohol Use: No  . Drug Use: No  . Sexually Active: Not on file   Other Topics Concern  . 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 She has family in [place] - a nephew was recently murdered.Daily Caffeine - CokesEND OF LIFE CARE: Patient hesitant to decide "I will decide at the time." thus she is a full code. She does not want to be a vegetable.     Review of Systems: She complains of dyspnea.  She is hard of hearing. Pertinent positive and negative review of systems were noted in the above history of present illness section. All other review of systems were otherwise negative   Physical Exam: Vital signs in last 24 hours: Temp:  [97.6 F (36.4 C)-99.6 F (37.6 C)] 98.3 F (36.8 C) (04/28 0555) Pulse Rate:  [77-113] 93  (04/28 0555) Resp:  [13-27] 27  (04/28 0555) BP: (108-142)/(44-89) 142/61 mmHg (04/28 0555) SpO2:  [90 %-100 %] 94 % (04/27 2354) Weight:  [123 lb 0.3 oz (55.8 kg)-125 lb (56.7 kg)] 123 lb 0.3 oz (55.8 kg) (04/27 2315)  General:   Alert,  Well-developed, well-nourished, pleasant and cooperative in NAD appears slightly dyspneic Head:  Normocephalic and atraumatic. Eyes:  Sclera clear, no icterus.   Conjunctiva pink. Ears:  Normal auditory acuity. Nose:  No deformity, discharge,  or lesions. Mouth:  No deformity or lesions.  Oropharynx pink & moist. Neck:  Supple; no masses or thyromegaly. Lungs:  There are a few expiratory wheezes .  No  crackles, or rhonchi. No acute distress. Heart:  Regular rate and rhythm; no murmurs, clicks, rubs,  or gallops. Abdomen:  Soft, nontender and nondistended. No masses, hepatosplenomegaly or hernias noted. Normal bowel sounds, without guarding, and without rebound.   Rectal:  Deferred until time of colonoscopy.   Msk:  Symmetrical without gross deformities. Normal posture. Pulses:   Normal pulses noted. Extremities:  Without clubbing or edema. Neurologic:  Alert and  oriented x4;  grossly normal neurologically. Skin:  Intact without significant lesions or rashes. Cervical Nodes:  No significant cervical adenopathy. Psych:  Alert and cooperative. Normal mood and affect.  Intake/Output from previous day: 04/27 0701 - 04/28 0700 In: 1338 [I.V.:238; Blood:1100] Out: 650 [Urine:650] Intake/Output this shift:    Lab Results:  Basename 08/06/11 1843 08/06/11 1840  WBC -- 17.7*  HGB 8.2* 7.0*  HCT 24.0* 22.6*  PLT -- 209   BMET  Basename 08/06/11 1843 08/06/11 1840  NA 125* 125*  K 4.3 4.4  CL 95* 89*  CO2 -- 21  GLUCOSE 142* 135*  BUN 19 18  CREATININE 1.40* 1.37*  CALCIUM -- 9.2   LFT  Basename 08/06/11 1840  PROT 6.8  ALBUMIN 3.5  AST 26  ALT 10  ALKPHOS 80  BILITOT 0.6  BILIDIR --  IBILI --   PT/INR  Basename 08/06/11 1840  LABPROT 14.1  INR 1.07   Hepatitis Panel No results found for this basename: HEPBSAG,HCVAB,HEPAIGM,HEPBIGM in the last 72 hours Additional Labs   Studies/Results: No results found.  Principal Problem:  *NSTEMI (non-ST elevated myocardial infarction) Active Problems:  CORONARY ARTERY DISEASE  DIVERTICULOSIS, COLON  Anemia   Recommendations -  #1 transfuse to hemoglobin 9 #2 continue Protonix #3 colonoscopy. I would defer this for at least one to 2 days until her  cardiac status has stabilized.  Assessment - **this 78 year old female was admitted with acute MI probably related to her microcytic anemia.*She likely has had a chronic bleed. With her history of colon cancer a bleeding polyp or an colonic neoplasm is a concern. Upper GI bleeding from active peptic ulcer disease is less likely in the face of therapy with Nexium. AVMs must also be considered.       Barbette Hair. Arlyce Dice, MD, [facility] [phone]    08/07/2011, 8:37 AM

## 2011-08-07 NOTE — Progress Notes (Signed)
  Echocardiogram 2D Echocardiogram has been performed.  Darlene [name] [facility] 08/07/2011, 11:45 AM

## 2011-08-07 NOTE — Progress Notes (Signed)
Subjective:  Patient states that she's doing better. She still has mild shortness of breath. She denies any chest pain. She received 3 units of packed RBCs yesterday.  Objective:  Vital Signs in the last 24 hours: Temp:  [97.6 F (36.4 C)-99.6 F (37.6 C)] 98.1 F (36.7 C) (04/28 0712) Pulse Rate:  [77-113] 113  (04/28 0913) Resp:  [13-27] 26  (04/28 0913) BP: (108-142)/(44-89) 140/65 mmHg (04/28 0712) SpO2:  [86 %-100 %] 95 % (04/28 0913) Weight:  [55.8 kg (123 lb 0.3 oz)-56.7 kg (125 lb)] 55.8 kg (123 lb 0.3 oz) (04/27 2315)  Intake/Output from previous day: 04/27 0701 - 04/28 0700 In: 1438 [I.V.:338; Blood:1100] Out: 650 [Urine:650]  Physical Exam:   General appearance: alert, cooperative, appears stated age and no distress Eyes: conjunctivae/corneas clear. PERRL, EOM's intact. Fundi benign., Left eyelid lesion suspicious for squamous cell carcinoma. Neck: no adenopathy, no carotid bruit, no JVD, supple, symmetrical, trachea midline and thyroid not enlarged, symmetric, no tenderness/mass/nodules Neck: JVP - normal, carotids 2+= without bruits Resp: rales bibasilar and half way up the chest Chest wall: no tenderness Cardio: S1, S2 normal and S4 present GI: soft, non-tender; bowel sounds normal; no masses,  no organomegaly Extremities: extremities normal, atraumatic, no cyanosis or edema    Lab Results:  Basename 08/06/11 1843 08/06/11 1840  WBC -- 17.7*  HGB 8.2* 7.0*  PLT -- 209    Basename 08/06/11 1843 08/06/11 1840  NA 125* 125*  K 4.3 4.4  CL 95* 89*  CO2 -- 21  GLUCOSE 142* 135*  BUN 19 18  CREATININE 1.40* 1.37*    Basename 08/06/11 1840  TROPONINI 0.73*   Hepatic Function Panel  Basename 08/06/11 1840  PROT 6.8  ALBUMIN 3.5  AST 26  ALT 10  ALKPHOS 80  BILITOT 0.6  BILIDIR --  IBILI --   Imaging: Imaging results have been reviewed  Cardiac Studies: EKG this morning reveals sinus tachycardia, right bundle branch park and the ST segment  depression in the lateral leads has improved but still persistent by about 1.5 mm.  Assessment/Plan:  1. Non-ST elevation myocardial infarction. 2. Acute GI bleed. In spite of 3 units of packed RBCs, patient's hemoglobin remains low. 3. Acute on chronic diastolic heart failure. Patient has bibasilar crackles suggestive of congestive heart failure. She is also tachycardic. Telemetry EKG shows persistence of ST segment depression. 4. Known coronary artery disease with history of inferior wall myocardial infarction and remote stenting to the right coronary artery. No 70% stenoses in the mid LAD.  Recommendations: A chest CT has been ordered for this morning. I think we should obtain a chest x-ray to see if she is edema and only if it does not show any significant abnormalities then consider doing CT scan of the chest to exclude any other secondary causes. Was suspicion is she has S4 gallop, bibasilar crackles and she is tachycardic with severe anemia and in the setting of acute myocardial infarction she is probably in congestive heart failure. I will transfuse 3 more units of packed RBCs. I will also reduce the dose of aspirin to 81 mg by mouth daily. I will also give him Lasix between transfusions. I will continue to follow along.   [name], M.D. 08/07/2011, 9:49 AM

## 2011-08-07 NOTE — Progress Notes (Signed)
CRITICAL VALUE ALERT  Critical value received:  CKMB  Date of notification:  08/07/2011  Time of notification:  1100  Critical value read back:yes  Nurse who received alert:  Oscar La  MD notified (1st page):  Harwanii  Time of first page:  1100  MD notified (2nd page):Ganji  Time of second page:1110  Responding MD:  Ronalee Belts  Time MD responded:  1110, 1120

## 2011-08-08 ENCOUNTER — Inpatient Hospital Stay (HOSPITAL_COMMUNITY): Payer: Medicare Other

## 2011-08-08 DIAGNOSIS — K922 Gastrointestinal hemorrhage, unspecified: Secondary | ICD-10-CM

## 2011-08-08 DIAGNOSIS — Z85038 Personal history of other malignant neoplasm of large intestine: Secondary | ICD-10-CM

## 2011-08-08 LAB — HEMOGLOBIN AND HEMATOCRIT, BLOOD
HCT: 33.7 % — ABNORMAL LOW (ref 36.0–46.0)
Hemoglobin: 11.9 g/dL — ABNORMAL LOW (ref 12.0–15.0)

## 2011-08-08 LAB — PRO B NATRIURETIC PEPTIDE: Pro B Natriuretic peptide (BNP): 5884 pg/mL — ABNORMAL HIGH (ref 0–450)

## 2011-08-08 LAB — CARDIAC PANEL(CRET KIN+CKTOT+MB+TROPI)
CK, MB: 4.5 ng/mL — ABNORMAL HIGH (ref 0.3–4.0)
Relative Index: 4.2 — ABNORMAL HIGH (ref 0.0–2.5)
Total CK: 108 U/L (ref 7–177)
Troponin I: 0.58 ng/mL (ref <0.30)

## 2011-08-08 MED ORDER — SODIUM CHLORIDE 0.9 % IJ SOLN
INTRAMUSCULAR | Status: AC
  Administered 2011-08-08: 3 mL via INTRAVENOUS
  Filled 2011-08-08: qty 10

## 2011-08-08 MED ORDER — FUROSEMIDE 10 MG/ML IJ SOLN
40.0000 mg | Freq: Once | INTRAMUSCULAR | Status: AC
  Administered 2011-08-08: 40 mg via INTRAVENOUS
  Filled 2011-08-08: qty 4

## 2011-08-08 MED ORDER — PANTOPRAZOLE SODIUM 40 MG PO TBEC
40.0000 mg | DELAYED_RELEASE_TABLET | Freq: Every day | ORAL | Status: DC
  Administered 2011-08-08 – 2011-08-12 (×5): 40 mg via ORAL
  Filled 2011-08-08 (×5): qty 1

## 2011-08-08 NOTE — Progress Notes (Signed)
[facility] Daily Rounding Note 08/08/2011, 8:50 AM  SUBJECTIVE:       Feels well.  No SOB, chest or belly pain.  BM yesterday .  Tolerating solids.   OBJECTIVE:        General: Frail but alert and pleasant aged WF  Vital signs in last 24 hours:    Temp:  [98 F (36.7 C)-99.1 F (37.3 C)] 98.6 F (37 C) (04/29 0430) Pulse Rate:  [73-113] 75  (04/29 0430) Resp:  [15-27] 18  (04/29 0430) BP: (106-141)/(49-75) 129/55 mmHg (04/29 0430) SpO2:  [92 %-97 %] 94 % (04/29 0430) Last BM Date: 08/07/11  Heart: RRR Chest: some basilar rales.  No cough or dyspnea. Abdomen: soft, NT, ND.  Active BS.  Extremities: no pedal edema Neuro/Psych:  Pleasant, HOH, not confused.  Not depressed  Intake/Output from previous day: 04/28 0701 - 04/29 0700 In: 3328.5 [P.O.:1080; I.V.:2236; Blood:12.5] Out: 2000 [Urine:2000]  Intake/Output this shift:    Lab Results:  Basename 08/08/11 0058 08/07/11 1804 08/07/11 1000 08/06/11 1840  WBC -- -- 14.0* 17.7*  HGB 11.1* 12.9 11.7* --  HCT 33.7* 38.9 36.0 --  PLT -- -- 172 209   BMET  Basename 08/07/11 1804 08/06/11 1843 08/06/11 1840  NA 130* 125* 125*  K 3.9 4.3 4.4  CL 91* 95* 89*  CO2 24 -- 21  GLUCOSE 132* 142* 135*  BUN 14 19 18   CREATININE 1.12* 1.40* 1.37*  CALCIUM 9.3 -- 9.2   LFT  Basename 08/06/11 1840  PROT 6.8  ALBUMIN 3.5  AST 26  ALT 10  ALKPHOS 80  BILITOT 0.6  BILIDIR --  IBILI --   PT/INR  Basename 08/06/11 1840  LABPROT 14.1  INR 1.07   Studies/Results: Dg Chest Port 1 View 08/08/2011  *RADIOLOGY REPORT*  Clinical Data: Short of breath  PORTABLE CHEST - 1 VIEW  Comparison: Yesterday  Findings: Left pleural effusion larger.  Central basilar airspace edema worse.  No pneumothorax. Tiny right pleural effusion improved.  Cardiomegaly.  IMPRESSION: Worsening CHF and left pleural effusion.  Original Report Authenticated By: Donavan Burnet, M.D.   Dg Chest Port 1 View 08/07/2011  *RADIOLOGY REPORT*  Clinical  Data: Shortness of breath  PORTABLE CHEST - 1 VIEW  Comparison: 07/06/2011  Findings:  Heart size appears mildly enlarged.  There is blunting of bilateral costophrenic angles consistent with small effusions.  Mild diffuse interstitial edema is noted.  There is decreased aeration to the left lung base which may represent an infiltrate.  IMPRESSION:  1.  Mild CHF. 2.  Left base opacity concerning for pneumonia.  Original Report Authenticated By: Rosealee Albee, M.D.    ASSESMENT: 1. Normocytic anemia. Heme positive. S/P 3 units PRBCs. On BID IV Protonix. 2.  Non STEMI. 3.  Hyponatremia.  4.  Colon CA and colectomy 1991.  lAST COLONOSCOPY 2006: tics and 'rrhoids.  5.  CHF, ? Left base PNA? Receiving Lasix.  No abx on board. BNP is 5885.  6.  Hx remote cardiac stent 1989  PLAN: 1.  egd tomorrow. Time to be determined.  Case d/w the pt who is agreeable to proceed.  Prefers not to have colonoscopy if she can avoid it.  2.  Talked to dr Debby Bud and will cut IVF back from 100/hour to 10-12/ hour.  3.  Change the bid iv protonix to oral once daily.  On nexium PTA.   LOS: 2 days   Jennye Moccasin  08/08/2011,  8:50 AM Pager: [phone]

## 2011-08-08 NOTE — Progress Notes (Signed)
I have reviewed the above note, examined the patient and agree with plan of treatment. Will check CEA level. She is for EGD tomorrow, hold off for 6 weeks before considering colonoscopy

## 2011-08-08 NOTE — Progress Notes (Addendum)
Subjective:  Patient states that she's doing better. She still has mild shortness of breath. She denies any chest pain. Ate her lunch without problems. No nausea, abdominal discomfort. Tele strips reveal ST segments back to baseline.  Objective:  Vital Signs in the last 24 hours: Temp:  [98 F (36.7 C)-99.1 F (37.3 C)] 98 F (36.7 C) (04/29 1200) Pulse Rate:  [73-85] 82  (04/29 0800) Resp:  [15-27] 17  (04/29 0800) BP: (106-129)/(49-75) 126/60 mmHg (04/29 1200) SpO2:  [92 %-97 %] 97 % (04/29 0800)  Intake/Output from previous day: 04/28 0701 - 04/29 0700 In: 3431.5 [P.O.:1080; I.V.:2339; Blood:12.5] Out: 2000 [Urine:2000]  Physical Exam:   General appearance: alert, cooperative, appears stated age and no distress Eyes: conjunctivae/corneas clear. PERRL, EOM's intact. Fundi benign., Left eyelid lesion suspicious for squamous cell carcinoma. Neck: no adenopathy, no carotid bruit, no JVD, supple, symmetrical, trachea midline and thyroid not enlarged, symmetric, no tenderness/mass/nodules Neck: JVP - normal, carotids 2+= without bruits Resp: rales bibasilar and half way up the chest Chest wall: no tenderness Cardio: S1, S2 normal and S4 present GI: soft, non-tender; bowel sounds normal; no masses,  no organomegaly Extremities: extremities normal, atraumatic, no cyanosis or edema    Lab Results:  Basename 08/08/11 0848 08/08/11 0058 08/07/11 1000 08/06/11 1840  WBC -- -- 14.0* 17.7*  HGB 11.9* 11.1* -- --  PLT -- -- 172 209    Basename 08/07/11 1804 08/06/11 1843 08/06/11 1840  NA 130* 125* --  K 3.9 4.3 --  CL 91* 95* --  CO2 24 -- 21  GLUCOSE 132* 142* --  BUN 14 19 --  CREATININE 1.12* 1.40* --    Basename 08/08/11 0058 08/07/11 1803  TROPONINI 0.58* 0.60*   Hepatic Function Panel  Basename 08/06/11 1840  PROT 6.8  ALBUMIN 3.5  AST 26  ALT 10  ALKPHOS 80  BILITOT 0.6  BILIDIR --  IBILI --   Imaging: Imaging results have been reviewed  Cardiac  Studies: EKG this morning reveals sinus tachycardia, right bundle branch park and the ST segment depression in the lateral leads has improved but still persistent by about 1.5 mm.  Assessment/Plan:  1. Non-ST elevation myocardial infarction. The cardiac markers are essentially flat suggesting that this is related to severe anemia when patient presented with acute GI bleed.  2. Acute GI bleed. H./H. has been stable since transfusions yesterday. 3. Acute on chronic diastolic heart failure. Patient has bibasilar crackles suggestive of congestive heart failure. She is also tachycardic. Telemetry EKG shows persistence of ST segment depression. 4. Known coronary artery disease with history of inferior wall myocardial infarction and remote stenting to the right coronary artery. No 70% stenoses in the mid LAD.  Recommendations:  We will continue Lasix for acute on chronic diastolic heart failure. I suspect is related to severe anemia and also patient received total of 6 units of packed RBCs since admission to the hospital. Incentive spirometry, physical therapy is indicated. From cardiac standpoint I think she should be able to undergo GI evaluation if indicated for further evaluation of source of GI bleed. Otherwise we can certainly continue to watch her and take a conservative route but I will leave this to the discretion of the gastroenterologist. I do not plan on performing any kind of cardiac workup. We will treat her conservatively. Continue beta blockers and ACE inhibitor at a low dose and will follow up on the serum creatinine as she does have chronic renal insufficiency. We will continue  with aspirin 81 mg by mouth daily. Continue lasix for now.  [name], M.D. 08/08/2011, 2:09 PM

## 2011-08-08 NOTE — Progress Notes (Signed)
Subjective: Patient is very HOH. She denies chest pain. She is short of breath but she feels she is at her baseline. RN reports hypoxemia now requiring 6 l O2 by [place]. No report of continued hematochezia  Objective: Lab: Lab Results  Component Value Date   WBC 14.0* 08/07/2011   HGB 11.1* 08/08/2011   HCT 33.7* 08/08/2011   MCV 80.4 08/07/2011   PLT 172 08/07/2011   Prior Hgb 12.9 after 4th unit of blood BMET    Component Value Date/Time   NA 130* 08/07/2011 1804   K 3.9 08/07/2011 1804   CL 91* 08/07/2011 1804   CO2 24 08/07/2011 1804   GLUCOSE 132* 08/07/2011 1804   BUN 14 08/07/2011 1804   CREATININE 1.12* 08/07/2011 1804   CALCIUM 9.3 08/07/2011 1804   GFRNONAA 43* 08/07/2011 1804   GFRAA 49* 08/07/2011 1804      Imaging: CXR - not yet read - preliminary reading progressive opacification at left base, and medial right lung.  Physical Exam: Filed Vitals:   08/08/11 0430  BP: 129/55  Pulse: 75  Temp: 98.6 F (37 C)  Resp: 18  Elderly HOH white woman in no acute distress. HEENT- lesion at left medial canthus OS looks angry. Eyes look wet Chest - decreased BS left base and midlung field, feint rales. Better air movement right. Cor - 2+ radial, RRR Abd -BS+, soft, no guarding or rebound     Assessment/Plan: 1. GI - no report of active bleeding. Hgb 11.7 g after 3 units, 12.9g after 4th unit to 11.1 this AM Plan Continue q8 H/H  Patient does seem cardiac stable for colonoscopy but hypoxemia may be prohibitive  2.Cardiac - no recurrent chest pain. Patient does appear fluid overloaded. Plan -  Add pBNP to AM lab   Will give an additional 40 mg IV lasix now  4. Anxiety - seems less anxious this AM   [name] 08/08/2011, 6:47 AM

## 2011-08-09 ENCOUNTER — Encounter (HOSPITAL_COMMUNITY)
Admission: EM | Disposition: A | Discharge status: Home Health | Payer: Self-pay | Source: Home / Self Care | Attending: Internal Medicine

## 2011-08-09 ENCOUNTER — Encounter (HOSPITAL_COMMUNITY): Payer: Self-pay

## 2011-08-09 HISTORY — PX: ESOPHAGOGASTRODUODENOSCOPY: SHX5428

## 2011-08-09 LAB — BASIC METABOLIC PANEL
BUN: 15 mg/dL (ref 6–23)
CO2: 27 mEq/L (ref 19–32)
Calcium: 9.1 mg/dL (ref 8.4–10.5)
GFR calc non Af Amer: 48 mL/min — ABNORMAL LOW (ref >90)
Glucose, Bld: 97 mg/dL (ref 70–99)

## 2011-08-09 LAB — TYPE AND SCREEN
Unit division: 0
Unit division: 0
Unit division: 0
Unit division: 0

## 2011-08-09 LAB — CBC
Hemoglobin: 11.7 g/dL — ABNORMAL LOW (ref 12.0–15.0)
MCH: 26.5 pg (ref 26.0–34.0)
MCHC: 32.5 g/dL (ref 30.0–36.0)
MCV: 81.4 fL (ref 78.0–100.0)
Platelets: 185 10*3/uL (ref 150–400)
RBC: 4.42 MIL/uL (ref 3.87–5.11)

## 2011-08-09 LAB — PRO B NATRIURETIC PEPTIDE: Pro B Natriuretic peptide (BNP): 4474 pg/mL — ABNORMAL HIGH (ref 0–450)

## 2011-08-09 SURGERY — EGD (ESOPHAGOGASTRODUODENOSCOPY)
Anesthesia: Moderate Sedation

## 2011-08-09 MED ORDER — BUTAMBEN-TETRACAINE-BENZOCAINE 2-2-14 % EX AERO
INHALATION_SPRAY | CUTANEOUS | Status: DC | PRN
  Administered 2011-08-09: 2 via TOPICAL

## 2011-08-09 MED ORDER — ASPIRIN 81 MG PO CHEW: 81.0000 mg | CHEWABLE_TABLET | Freq: Every day | ORAL | Status: DC

## 2011-08-09 MED ORDER — EZETIMIBE 10 MG PO TABS
10.0000 mg | ORAL_TABLET | Freq: Every day | ORAL | Status: DC
  Administered 2011-08-09 – 2011-08-12 (×4): 10 mg via ORAL
  Filled 2011-08-09 (×5): qty 1

## 2011-08-09 MED ORDER — FENTANYL CITRATE 0.05 MG/ML IJ SOLN
INTRAMUSCULAR | Status: AC
  Filled 2011-08-09: qty 2

## 2011-08-09 MED ORDER — FENTANYL NICU IV SYRINGE 50 MCG/ML
INJECTION | INTRAMUSCULAR | Status: DC | PRN
  Administered 2011-08-09: 25 ug via INTRAVENOUS

## 2011-08-09 MED ORDER — NITROGLYCERIN 0.4 MG SL SUBL
0.4000 mg | SUBLINGUAL_TABLET | SUBLINGUAL | Status: DC | PRN
Start: 2011-08-09 — End: 2011-08-09

## 2011-08-09 MED ORDER — MIDAZOLAM HCL 10 MG/2ML IJ SOLN
INTRAMUSCULAR | Status: DC | PRN
  Administered 2011-08-09: 1 mg via INTRAVENOUS
  Administered 2011-08-09: 2 mg via INTRAVENOUS

## 2011-08-09 MED ORDER — MIDAZOLAM HCL 10 MG/2ML IJ SOLN
INTRAMUSCULAR | Status: AC
  Filled 2011-08-09: qty 2

## 2011-08-09 NOTE — Progress Notes (Signed)
Subjective:  Patient states that she's doing better. She still has mild shortness of breath. Is needing supplemental O2 for maintaining sat > 92-95%  She denies any chest pain. Presently being prepped for Upper GI endoscopy. No nausea, abdominal discomfort.  Objective:  Vital Signs in the last 24 hours: Temp:  [98 F (36.7 C)-99.4 F (37.4 C)] 98 F (36.7 C) (04/30 0800) Pulse Rate:  [71-96] 96  (04/30 1036) Resp:  [18-24] 19  (04/30 0800) BP: (114-149)/(49-69) 124/67 mmHg (04/30 1034) SpO2:  [90 %-95 %] 95 % (04/30 0800)  Intake/Output from previous day: 04/29 0701 - 04/30 0700 In: 1126 [P.O.:720; I.V.:406] Out: 2575 [Urine:2575]  Physical Exam:   General appearance: alert, cooperative, appears stated age and no distress Eyes: conjunctivae/corneas clear. PERRL, EOM's intact. Fundi benign., Left eyelid lesion suspicious for squamous cell carcinoma. Neck: no adenopathy, no carotid bruit, no JVD, supple, symmetrical, trachea midline and thyroid not enlarged, symmetric, no tenderness/mass/nodules Neck: JVP - normal, carotids 2+= without bruits Resp: rales bibasilar and half way up the chest Chest wall: no tenderness Cardio: S1, S2 normal and S4 present GI: soft, non-tender; bowel sounds normal; no masses,  no organomegaly Extremities: extremities normal, atraumatic, no cyanosis or edema    Lab Results:  Basename 08/09/11 0410 08/08/11 0848 08/07/11 1000  WBC 8.4 -- 14.0*  HGB 11.7* 11.9* --  PLT 185 -- 172    Basename 08/09/11 0410 08/07/11 1804  NA 131* 130*  K 3.7 3.9  CL 93* 91*  CO2 27 24  GLUCOSE 97 132*  BUN 15 14  CREATININE 1.02 1.12*    Basename 08/08/11 0058 08/07/11 1803  TROPONINI 0.58* 0.60*   Hepatic Function Panel  Basename 08/06/11 1840  PROT 6.8  ALBUMIN 3.5  AST 26  ALT 10  ALKPHOS 80  BILITOT 0.6  BILIDIR --  IBILI --   Imaging: Imaging results have been reviewed  Cardiac Studies: EKG this morning reveals sinus tachycardia, right  bundle branch park and the ST segment depression in the lateral leads has improved but still persistent by about 1.5 mm.  Assessment/Plan:  1. Non-ST elevation myocardial infarction. Minimal troponin elevation.  The cardiac markers are essentially flat suggesting that this is related to severe anemia when patient presented with acute GI bleed.  2. Acute GI bleed. H./H. has been stable since transfusions yesterday.  3. Acute on chronic diastolic heart failure. Patient has bibasilar crackles suggestive of congestive heart failure.   4. Known coronary artery disease with history of inferior wall myocardial infarction and remote stenting to the right coronary artery. No 70% stenoses in the mid LAD.  5. ? Pneumonia. Patient was found to have crackles at the lung bases on admission and now progressing. No fever.    Recommendations:  We will continue Lasix for acute on chronic diastolic heart failure. Change to PO in 24-48 hours.Continue incentive spirometry, physical therapy is indicated. D/C IV NTG. Activity can be increased as tolerated by primary team.   No other specific recommendations.   [name], M.D. 08/09/2011, 11:59 AM

## 2011-08-09 NOTE — Procedures (Signed)
Upper endoscopy completed: see report under Procedures  Results: normal esophagus, stomach, duodenum, nothing to account for GI blood loss Plan:  Will need to have a colonoscopy  At least 6 weeks from acute MI, in the meantime order CEA level, monitor H/H and stool cards

## 2011-08-09 NOTE — Op Note (Signed)
[facility] [address] [place], [place]  [address]  ENDOSCOPY PROCEDURE REPORT  PATIENT:  [name], Darlene  MR#:  [id number] BIRTHDATE:  1920-07-19, 89 yrs. old  GENDER:  female  ENDOSCOPIST:  Hedwig Morton. Juanda Chance, MD Referred by:  Iva Boop, M.D., [facility]  PROCEDURE DATE:  08/09/2011 PROCEDURE:  EGD, diagnostic 19147 ASA CLASS:  Class III INDICATIONS:  iron deficiency anemia acute MI, microcytic anemia,  hx of colon Ca 1991, s/p right hemicolectomy, denies any GI Sx's,  MEDICATIONS:   These medications were titrated to patient response per physician's verbal order, Versed 2 mg, Fentanyl 25 mcg TOPICAL ANESTHETIC:  Cetacaine Spray  DESCRIPTION OF PROCEDURE:   After the risks benefits and alternatives of the procedure were thoroughly explained, informed consent was obtained.  The Pentax Gastroscope [id number] endoscope was introduced through the mouth and advanced to the second portion of the duodenum, without limitations.  The instrument was slowly withdrawn as the mucosa was fully examined. <<PROCEDUREIMAGES>>  The upper, middle, and distal third of the esophagus were carefully inspected and no abnormalities were noted. The z-line was well seen at the GEJ. The endoscope was pushed into the fundus which was normal including a retroflexed view. The antrum,gastric body, first and second part of the duodenum were unremarkable (see image001, image002, image003, image004, and image005). Retroflexed views revealed no abnormalities.    The scope was then withdrawn from the patient and the procedure completed.  COMPLICATIONS:  None  ENDOSCOPIC IMPRESSION: 1) Normal EGD nothing to account for GI blood loss RECOMMENDATIONS: will need colonoscopy after recovering from acute MI  REPEAT EXAM:  In 0 year(s) for.  ______________________________ Hedwig Morton. Juanda Chance, MD  CC:  n. eSIGNED:   Hedwig Morton. Yania Bogie at 08/09/2011 04:55 PM  Ignacia Felling, [phone]

## 2011-08-09 NOTE — Interval H&P Note (Signed)
History and Physical Interval Note:  08/09/2011 4:25 PM  Darlene [name]  has presented today for surgery, with the diagnosis of anemia, heme positive stools  The various methods of treatment have been discussed with the patient and family. After consideration of risks, benefits and other options for treatment, the patient has consented to  Procedure(s) (LRB): ESOPHAGOGASTRODUODENOSCOPY (EGD) (N/A) as a surgical intervention .  The patients' history has been reviewed, patient examined, no change in status, stable for surgery.  I have reviewed the patients' chart and labs.  Questions were answered to the patient's satisfaction.     Lina Sar

## 2011-08-09 NOTE — Progress Notes (Signed)
08/09/2011 1730 Pt arrived back from endo. VSS. Pt alert and oriented. Bed in low position, call bell with in reach, and bed alarm set. Will continue to monitor. Darlene [name]

## 2011-08-09 NOTE — Progress Notes (Signed)
Subjective: [name] looks like she feels better - a little lipstick can really help a girl. No chest pain, no labored respirations, no abdominal pain  Objective: Lab: Lab Results  Component Value Date   WBC 8.4 08/09/2011   HGB 11.7* 08/09/2011   HCT 36.0 08/09/2011   MCV 81.4 08/09/2011   PLT 185 08/09/2011   BMET    Component Value Date/Time   NA 131* 08/09/2011 0410   K 3.7 08/09/2011 0410   CL 93* 08/09/2011 0410   CO2 27 08/09/2011 0410   GLUCOSE 97 08/09/2011 0410   BUN 15 08/09/2011 0410   CREATININE 1.02 08/09/2011 0410   CALCIUM 9.1 08/09/2011 0410   GFRNONAA 48* 08/09/2011 0410   GFRAA 55* 08/09/2011 0410      Imaging: No new imaging  Physical Exam: Filed Vitals:   08/09/11 0400  BP: 137/69  Pulse: 83  Temp: 98.9 F (37.2 C)  Resp: 18    Intake/Output Summary (Last 24 hours) at 08/09/11 0755 Last data filed at 08/09/11 0600  Gross per 24 hour  Intake   1106 ml  Output   2575 ml  Net  -1469 ml   Cor - RRR with PVCs, strong peripheral pulse Chest - decreased breath sounds left base but better than yesterday, no rales, no wheeze Abd- soft, BS+      Assessment/Plan: 1. GI - no report of recurrent bleeding. Hgb is holding steady at 11.7 g. Plan  For EGD today  Change H/H to q12  2. Cardiac - no recurrent chest pain. Dr. Verl Dicker note reviewed: no plans for any intervention. Acute volume overload is improved - adequate diuresis. Remains hypoxemic - requiring oxygen at 4-6 liters Plan Continue baseline furosemide  Continue cardiac meds  3. Derm - lesion looks a little raw otherwise unchanged  4. Anxiety - seems better. Plan  Continue sertraline     Darlene [name] 08/09/2011, 7:54 AM

## 2011-08-10 ENCOUNTER — Encounter: Payer: Self-pay | Admitting: Internal Medicine

## 2011-08-10 ENCOUNTER — Encounter (HOSPITAL_COMMUNITY): Payer: Self-pay | Admitting: Internal Medicine

## 2011-08-10 DIAGNOSIS — K922 Gastrointestinal hemorrhage, unspecified: Secondary | ICD-10-CM

## 2011-08-10 LAB — BASIC METABOLIC PANEL
Calcium: 9 mg/dL (ref 8.4–10.5)
Calcium: 9.2 mg/dL (ref 8.4–10.5)
GFR calc Af Amer: 54 mL/min — ABNORMAL LOW (ref >90)
GFR calc Af Amer: 59 mL/min — ABNORMAL LOW (ref >90)
GFR calc non Af Amer: 47 mL/min — ABNORMAL LOW (ref >90)
GFR calc non Af Amer: 51 mL/min — ABNORMAL LOW (ref >90)
Glucose, Bld: 94 mg/dL (ref 70–99)
Glucose, Bld: 104 mg/dL — ABNORMAL HIGH (ref 70–99)
Sodium: 131 mEq/L — ABNORMAL LOW (ref 135–145)
Sodium: 128 mEq/L — ABNORMAL LOW (ref 135–145)

## 2011-08-10 LAB — HEMOGLOBIN AND HEMATOCRIT, BLOOD: HCT: 37.4 % (ref 36.0–46.0)

## 2011-08-10 LAB — PRO B NATRIURETIC PEPTIDE: Pro B Natriuretic peptide (BNP): 5037 pg/mL — ABNORMAL HIGH (ref 0–450)

## 2011-08-10 MED ORDER — FUROSEMIDE 10 MG/ML IJ SOLN
40.0000 mg | Freq: Three times a day (TID) | INTRAMUSCULAR | Status: DC
  Administered 2011-08-10 – 2011-08-12 (×7): 40 mg via INTRAVENOUS
  Filled 2011-08-10 (×12): qty 4

## 2011-08-10 NOTE — Progress Notes (Signed)
08/10/2011 10:56 AM Darlene [name]  [id number]  Report called to Laurie,RN on receiving unit,2000. VSS. Transferred with belongings to new room,2025.   [name] 5/1/201310:56 AM

## 2011-08-10 NOTE — Progress Notes (Signed)
     [facility] Daily Rounding Note 08/10/2011, 8:25 AM  SUBJECTIVE:       Eating moderately well.  No chest pain.  Denies SOB.  Worried / convinced she has cancer as cause for anemia and heme + stool.  Hard to convince her otherwise.   OBJECTIVE:        General: Looks better, not well though     Vital signs in last 24 hours:    Temp:  [97.7 F (36.5 C)-98.7 F (37.1 C)] 98.2 F (36.8 C) (05/01 0403) Pulse Rate:  [59-96] 59  (05/01 0403) Resp:  [9-34] 11  (05/01 0403) BP: (108-153)/(47-94) 120/94 mmHg (05/01 0403) SpO2:  [88 %-98 %] 98 % (05/01 0403) Last BM Date: 08/07/11  Heart: RRRwith soft SEM Chest: some fine crackles at bases Abdomen: soft, active BS.  NT. ND  Extremities: no pedal edema Neuro/Psych:  Anxious, not confused  Intake/Output from previous day: 04/30 0701 - 05/01 0700 In: 130 [I.V.:130] Out: 1825 [Urine:1825]  Intake/Output this shift:    Lab Results:  Basename 08/09/11 0410 08/08/11 0848 08/08/11 0058 08/07/11 1000  WBC 8.4 -- -- 14.0*  HGB 11.7* 11.9* 11.1* --  HCT 36.0 36.4 33.7* --  PLT 185 -- -- 172   BMET  Basename 08/10/11 0413 08/09/11 0410 08/07/11 1804  NA 131* 131* 130*  K 3.5 3.7 3.9  CL 89* 93* 91*  CO2 32 27 24  GLUCOSE 94 97 132*  BUN 18 15 14   CREATININE 1.04 1.02 1.12*  CALCIUM 9.0 9.1 9.3    ASSESMENT: 1.  Normocytic anemia.  Heme + stools.  S/P PRBC transfusions x 4 units (I confirmed this with blood bank). Hgb/crit stable.  Normal EGD 08/09/2011 2.  Non STEMI.  No plans for cardiac studies, Dr Nadara Eaton pursuing conservative mgt.  Continues on ASA.  3.  Partial colectomy for colon CA 1991,  Latest colonoscopy 2006: tics and 'rrhoids.  4.  CHF, acute on chronic diastolic. Still hypoxic and dependent on supplemental Oxygen.  5.  Hyponatremia.  6.  Anxiety 7.  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    PLAN: 1.  GI follow up with Dr Leone Payor 09/16/2011 (appt is on D/c summary).  Depending on her overall status, colonoscopy can be  arranged at that visit.  In meantime: periodic CBC monitoring.  Will sign off.   LOS: 4 days   Jennye Moccasin  08/10/2011, 8:25 AM Pager: [phone]

## 2011-08-10 NOTE — Progress Notes (Signed)
I have reviewed the above note, examined the patient and agree with plan of treatment.  

## 2011-08-10 NOTE — Progress Notes (Signed)
Subjective: EGD report reviewed. Dr. Verl Dicker note reviewed - no intervention planned. Patient has remained hypoxemic requiring 5-6 L/min by [address] to maintain O2 sats. \ She has no medical complaint and wants to go home.  Objective: Lab: Lab Results  Component Value Date   WBC 8.4 08/09/2011   HGB 11.7* 08/09/2011   HCT 36.0 08/09/2011   MCV 81.4 08/09/2011   PLT 185 08/09/2011   BMET    Component Value Date/Time   NA 131* 08/10/2011 0413   K 3.5 08/10/2011 0413   CL 89* 08/10/2011 0413   CO2 32 08/10/2011 0413   GLUCOSE 94 08/10/2011 0413   BUN 18 08/10/2011 0413   CREATININE 1.04 08/10/2011 0413   CALCIUM 9.0 08/10/2011 0413   GFRNONAA 47* 08/10/2011 0413   GFRAA 54* 08/10/2011 0413  pBNP 5037 < 1610<9604    Imaging: CXR 08/08/11 Findings: Left pleural effusion larger. Central basilar airspace  edema worse. No pneumothorax. Tiny right pleural effusion  improved. Cardiomegaly.  IMPRESSION:  Worsening CHF and left pleural effusion.  2D echo - EF 45%, grade II diastolic dysfunction, diffuse hypokinesis  Physical Exam: Filed Vitals:   08/10/11 0403  BP: 120/94  Pulse: 59  Temp: 98.2 F (36.8 C)  Resp: 11    Intake/Output Summary (Last 24 hours) at 08/10/11 0725 Last data filed at 08/10/11 0400  Gross per 24 hour  Intake    130 ml  Output   1825 ml  Net  -1695 ml     Sitting on BSC - no distress Cor - RRR II/VI systolic murmur Pulm - dedcreased BS left base; rales right base Abd- soft.     Assessment/Plan: 1. GI - no report of bleeding. Hgb for today is pending. EGD was normal. Plan - continue to monitor H/H  Delayed colonoscopy for 6 weeks  2. Cardiac - no chest pain. Persistent/worsening CHF. Plan Increase lasix to 40 mg IV q 8  Monitor renal function and potassium  4. Anxiety - still very worried about her son.   Darlene [name] 08/10/2011, 7:15 AM

## 2011-08-11 ENCOUNTER — Inpatient Hospital Stay (HOSPITAL_COMMUNITY): Payer: Medicare Other

## 2011-08-11 LAB — GLUCOSE, CAPILLARY

## 2011-08-11 LAB — BASIC METABOLIC PANEL
BUN: 22 mg/dL (ref 6–23)
Calcium: 9.5 mg/dL (ref 8.4–10.5)
Chloride: 84 mEq/L — ABNORMAL LOW (ref 96–112)
Creatinine, Ser: 1.05 mg/dL (ref 0.50–1.10)
GFR calc Af Amer: 53 mL/min — ABNORMAL LOW (ref >90)

## 2011-08-11 MED ORDER — POTASSIUM CHLORIDE CRYS ER 20 MEQ PO TBCR
20.0000 meq | EXTENDED_RELEASE_TABLET | Freq: Two times a day (BID) | ORAL | Status: DC
  Administered 2011-08-11 – 2011-08-12 (×3): 20 meq via ORAL
  Filled 2011-08-11 (×5): qty 1

## 2011-08-11 NOTE — Progress Notes (Signed)
CARDIAC REHAB PHASE I   PRE:  Rate/Rhythm: 73SR  BP:  Supine:   Sitting: 120/60  Standing:    SaO2: 86-87%RA  MODE:  Ambulation: 350 ft   POST:  Rate/Rhythem: 80  BP:  Supine:   Sitting: 112/62  Standing:    SaO2: 91-92%2L 1325-1350 Pt's RN notified us that pt had MI diagnosis. Wrote consult order per MI protocol. Pt had oxygen off when I entered room. Took 2 L to keep sats up. Pt stated did not want to go home on Oxygen. Discussed with pt that using IS that was in her room may help to improve sats. Showed pt how to use with her getting IS to 250-500. Encouraged her to use 10x hourly. Pt walked 350 ft with rolling walker and asst x 1 on 2L. Tolerated well for first walk. Discussed with pt keeping oxygen on. Chair alarm on. Denied CP. SATURATION QUALIFICATIONS:  Patient Saturations on Room Air at Rest = 86-87%  Patient Saturations on Room Air while Ambulating = 86%-87%  Patient Saturations on 2 Liters of oxygen while Ambulating = 91-92%   Duanne Limerick

## 2011-08-11 NOTE — Progress Notes (Signed)
Subjective: Find patient sitting on BSC. No distress. Denies chest pain. No further bleeding. Says she will not have  Colonoscopy.  RN reports that there is bright red blood in the [facility] along with brown stool  Objective: Lab:Hgb 12.9   BMET    Component Value Date/Time   NA 131* 08/11/2011 0500   K 3.4* 08/11/2011 0500   CL 84* 08/11/2011 0500   CO2 38* 08/11/2011 0500   GLUCOSE 105* 08/11/2011 0500   BUN 22 08/11/2011 0500   CREATININE 1.05 08/11/2011 0500   CALCIUM 9.5 08/11/2011 0500   GFRNONAA 46* 08/11/2011 0500   GFRAA 53* 08/11/2011 0500     Imaging: no new imaging   Physical Exam: Filed Vitals:   08/11/11 0446  BP: 124/76  Pulse: 73  Temp: 98.3 F (36.8 C)  Resp: 19    Intake/Output Summary (Last 24 hours) at 08/11/11 4098 Last data filed at 08/11/11 0600  Gross per 24 hour  Intake    240 ml  Output   3025 ml  Net  -2785 ml   Net Out for admission - 3,709 Gen'l- elderly white woman in no distress Chest - decreased BS left base, no wheezes, no rales Cor - RRR, II/VI mm Neuro - A&O     Assessment/Plan: 1. GI - continued low volume bleeding. No further work-up at this time - GI has signed off.  Plan - outpatient follow-up  H/H in AM  2. Cardiac - no chest pain. Patient has diuresed but she remains on 6 liters O2 and has persistent effusion LLL.  Plan - 2 view CXR  Wean down O2 to keep sat greater than 90%  5. Lyte - mild drop in K  Plan Oral replacement   [name] 08/11/2011, 7:01 AM

## 2011-08-12 ENCOUNTER — Other Ambulatory Visit: Payer: Self-pay | Admitting: Internal Medicine

## 2011-08-12 LAB — BASIC METABOLIC PANEL
GFR calc Af Amer: 51 mL/min — ABNORMAL LOW (ref >90)
GFR calc non Af Amer: 44 mL/min — ABNORMAL LOW (ref >90)
Glucose, Bld: 102 mg/dL — ABNORMAL HIGH (ref 70–99)
Potassium: 3.4 mEq/L — ABNORMAL LOW (ref 3.5–5.1)
Sodium: 131 mEq/L — ABNORMAL LOW (ref 135–145)

## 2011-08-12 LAB — HEMOGLOBIN AND HEMATOCRIT, BLOOD: Hemoglobin: 12.8 g/dL (ref 12.0–15.0)

## 2011-08-12 MED ORDER — HYDROCODONE-ACETAMINOPHEN 5-325 MG PO TABS: 1.0000 | ORAL_TABLET | Freq: Two times a day (BID) | ORAL | Status: DC

## 2011-08-12 MED ORDER — SERTRALINE HCL 50 MG PO TABS: 50.0000 mg | ORAL_TABLET | Freq: Every day | ORAL | Status: DC

## 2011-08-12 MED ORDER — FUROSEMIDE 40 MG PO TABS: 40.0000 mg | ORAL_TABLET | Freq: Every day | ORAL | Status: DC

## 2011-08-12 NOTE — Progress Notes (Signed)
   CARE MANAGEMENT NOTE 08/12/2011  Patient:  Darlene [name], Darlene [name]   Account Number:  [id number]  Date Initiated:  08/09/2011  Documentation initiated by:  Ronny Flurry  Subjective/Objective Assessment:   DX: anemia , NSTEMI  08/12/2011 Order for home oxygen, HHRN and respiratory care.     Action/Plan:   Met with pt and setup HH with [facility] and ordered home oxygen. Explained to pt the use of tank for transport and concentrator. Call pt friend Vern who assist pt with transportation, he is familiar with oxygen.   Anticipated DC Date:  08/12/2011   Anticipated DC Plan:  HOME W HOME HEALTH SERVICES         [facility]  DURABLE MEDICAL EQUIPMENT  HOME HEALTH   Choice offered to / List presented to:  C-1 Patient   DME arranged  OXYGEN      DME agency  [facility]     [facility] arranged  HH-1 RN      [facility] agency  [facility]   Status of service:  Completed, signed off Medicare Important Message given?   (If response is "NO", the following Medicare IM given date fields will be blank) Date Medicare IM given:   Date Additional Medicare IM given:    Discharge Disposition:  HOME W HOME HEALTH SERVICES  Per UR Regulation:  Reviewed for med. necessity/level of care/duration of stay  If discussed at Long Length of Stay Meetings, dates discussed:    Comments:  08/12/2011 Spoke with pt at length, attempting to explain and reassure her re oxygen home use, pt states that she lives with her son, Ethelene Browns. Attempted multiple times to reach Ethelene Browns and discuss [facility] needs. Was able to reach pt friend who is providing transportation, Vern. Vern is knowledgeable re oxygen and checks on this pt regularly, he states that the pt husband calls her from the SNF where he is a resident. Vern states that the husband calls the pt at least twice per day and will notify him if he has concerns. Vern states that he has not met the pt son and while he lives with the pt he is not very visible and does not  appear to be available to assist. This CM informed pt that she had talked with Vern and attempted to reach Ethelene Browns, reassured her that Vern understands the oxygen and that [facility] will explain the concentrator at the time of delivery. Johny Shock RN MPH Case Manager [phone]

## 2011-08-12 NOTE — Progress Notes (Signed)
Subjective:  Patient states that she's doing better. Denies dyspnea. No chest pain. No Bm today, but no reported bloody stools last 24 hours   Objective:  Vital Signs in the last 24 hours: Temp:  [97.2 F (36.2 C)-97.8 F (36.6 C)] 97.2 F (36.2 C) (05/03 0427) Pulse Rate:  [60-67] 67  (05/03 0427) Resp:  [19] 19  (05/03 0427) BP: (120-127)/(60-65) 127/65 mmHg (05/03 0427) SpO2:  [94 %-98 %] 98 % (05/03 0427)  Intake/Output from previous day: 05/02 0701 - 05/03 0700 In: 720 [P.O.:720] Out: 900 [Urine:900]  Physical Exam:  General appearance: alert, cooperative, appears stated age and no distress  Eyes: conjunctivae/corneas clear. PERRL, EOM's intact. Fundi benign., Left eyelid lesion suspicious for squamous cell carcinoma.  Neck: no adenopathy, no carotid bruit, no JVD, supple, symmetrical, trachea midline and thyroid not enlarged, symmetric, no tenderness/mass/nodules  Neck: JVP - normal, carotids 2+= without bruits  Resp: faint bibasilar crackles markedly improved.  Chest wall: no tenderness  Cardio: S1, S2 normal and S4 present  GI: soft, non-tender; bowel sounds normal; no masses, no organomegaly  Extremities: extremities normal, atraumatic, no cyanosis or edema   Lab Results:  Basename 08/12/11 0538 08/11/11 0500  WBC -- --  HGB 12.8 12.9  PLT -- --    Basename 08/12/11 0538 08/11/11 0500  NA 131* 131*  K 3.4* 3.4*  CL 84* 84*  CO2 39* 38*  GLUCOSE 102* 105*  BUN 24* 22  CREATININE 1.09 1.05   No results found for this basename: TROPONINI:2,CK,MB:2 in the last 72 hours Hepatic Function Panel No results found for this basename: PROT,ALBUMIN,AST,ALT,ALKPHOS,BILITOT,BILIDIR,IBILI in the last 72 hours Imaging: Imaging results have been reviewed  Cardiac Studies: EKG this morning reveals sinus tachycardia, right bundle branch park and the ST segment depression in the lateral leads has improved but still persistent by about 1.5 mm.  Assessment/Plan:  1.  Non-ST elevation myocardial infarction. Minimal troponin elevation.  The cardiac markers are essentially flat suggesting that this is related to severe anemia when patient presented with acute GI bleed.  2. Acute GI bleed. H./H. has been stable since transfusions yesterday.  3. Acute on chronic diastolic heart failure. Patient has bibasilar crackles which are markedly improved from 2 days ago. CXR shows resolution of edema and effusion.Marland Kitchen   4. Known coronary artery disease with history of inferior wall myocardial infarction and remote stenting to the right coronary artery. No 70% stenoses in the mid LAD.     Recommendations: She is presently stable and can be switched to PO lasix q daily for now and discharge per primary team. I will be happy to follow her ASAP after discharge or when felt necessary. Cell: [id number]. Office [phone] No other specific recommendations.   [name], M.D. 08/12/2011, 8:39 AM

## 2011-08-12 NOTE — Discharge Summary (Signed)
Darlene [name], Darlene [name]             ACCOUNT NO.:  [id number]  MEDICAL RECORD NO.:  [id number]  LOCATION:  2025                         FACILITY:  [facility]  PHYSICIAN:  Rosalyn Gess. Jla Reynolds, MD  DATE OF BIRTH:  03/06/1921  DATE OF ADMISSION:  08/06/2011 DATE OF DISCHARGE:  08/12/2011                              DISCHARGE SUMMARY   ADMITTING DIAGNOSES: 1. Significant chest pain. 2. Profound anemia. 3. Hyperlipidemia. 4. Hypertension.  DISCHARGE DIAGNOSES: 1. Gastrointestinal bleed of uncertain etiology. 2. Non-ST elevation myocardial infarction secondary to anemia. 3. Hyperlipidemia, stable. 4. Hypertension, stable.  CONSULTANTS:  Dr. Jacinto Halim for Cardiology, Dr. Juanda Chance for Gastroenterology.  PROCEDURES: 1. Chest x-ray the day of admission to the office showing mild CHF.     Left base opacity concerning for pneumonia. 2. Chest x-ray on August 08, 2011, which showed worsening CHF and left     pleural effusion.  3.  Two-view chest x-ray, Aug 11, 2011, which     showed improved aeration with resolved congestive heart failure and     nearly completely resolved pleural effusions.  Improved bibasilar     aeration with mild subsegmental atelectasis.  Osteopenia and lower     thoracic spine with compression deformity. 3. Upper endoscopy from August 09, 2011, showed upper, middle and     distal third esophagus were normal.  There was no site of bleeding     identified.  This was considered a normal study.  HISTORY OF PRESENT ILLNESS:  The patient is an 78 year old woman with a known history of colon cancer status post low resection, history of coronary artery disease status post stenting in the past.  She presented with a history of 1 week of chest pain that was intermittent.  She had been seen several weeks prior to admission and was thought to have atypical chest pain that did not require further treatment or evaluation.  The patient in the emergency department was found to have pain that was  7/10.  EKG revealed the patient to have ST-T wave depression on lateral leads.  The patient also was found to have a hemoglobin of 7 g down from her baseline of 10.  She denied any history of melena or bright red blood per rectum.  She reports having some blood with bowel movements intermittently.  Because of the patient's non ST- elevation changes and because of her profound anemia, she was admitted to the hospital, initially to the coronary care unit.  Please see the H and P for past medical history, family history, social history, and admission examination, also previous EPIC records.  HOSPITAL COURSE: 1. Cardiac:  The patient was seen by Dr. Jacinto Halim in consultation.  She     also appeared to be fluid overloaded at first hospital day.  Dr.     Jacinto Halim felt the patient's non-STEM MI was more likely secondary     to her anemia.  He did not feel she needed any significant     interventions and specifically a cardiac catheterization.     Correction of her underlying problems would in fact provide     treatment for chest pain.  The patient did have significant fluid overload after receiving  4 units of blood.  She was adequately diuresed, given IV Lasix 40 mg q.8 hours for 6 doses.  Final chest x-ray did reveal resolution of her CHF and pleural effusions.  The patient did have mild hypoxemia secondary to her CHF.  She had been requiring 6 L of oxygen.  On the day prior to discharge, the patient was evaluated and found to have oxygen saturation of 86-87% at rest on room air.  With ambulation, her oxygen saturation was 86 87%.  On 2 L of oxygen, her saturation was 91-92%.  The patient will require home oxygen at this time.  With the patient having no chest pain with her CHF being resolved with her being hemodynamically stable, she does not require further inpatient evaluation.  She will continue all her home medications with the addition of oxygen.  The patient will need to follow up with  Dr. Jacinto Halim in 7-10 days.  2. GI.  The patient presented with profound anemia with a hemoglobin     of 7.  She was transfused a total of 4 units of packed red cells     and on this regimen her hemoglobin markedly improved so that her     final hemoglobin on the day of discharge was 12.8 g.  She had an     evaluation that would rule out for any upper GI source.  Concern is     a lower GI source that is concerning since the patient     has had previous colon cancer.  Because of her non-STEMI MI, it was     felt prudent to delay colonoscopy for 4-6 weeks.  At the time of     discharge, the patient was fairly adamant that she did not want     colonoscopy, but we will rediscuss this with her at the time of     followup.  With the patient's hemoglobin being stable with no     evidence of active ongoing bleeding, the patient is stable and     ready for discharge to home.  2. Dermatology.  The patient does have a lesion at the medial canthus     of the left eye which is very worrisome for a basal cell carcinoma.     She had previously been referred to [facility] for     treatment but she canceled her surgery on 2 occasions.  Will need     to follow up with her about this lesion.  3. Anxiety.  The patient has significant anxiety.  Did start her on     sertraline 50 mg daily.  She was continued on Ativan 0.5 mg q.6.     The patient will be continued on sertraline at the time of     discharge.  DISCHARGE EXAMINATION:  VITAL SIGNS:  Temperature was 97.2, blood pressure was 127/65, pulse was 67, respirations were 19, O2 sats 98% on 2 L.  I's and O's for this admission were negative  3889 mL. GENERAL APPEARANCE:  This is a very elderly woman sitting in a Geri chair who is in no acute distress. HEENT:  Conjunctiva and sclerae were clear.  The patient has a large worrisome lesion on the medial canthus of the left eye.  Pupils were equal, round, and reactive. NECK:  Supple. CHEST:  No  CVA tenderness.  No deformity. PULMONARY:  The patient has no increased work of breathing. LUNGS:  Clear to auscultation and percussion with no  rales, wheezes, or rhonchi.  Decreased breath sounds at the left base had resolved.  Oxygen saturation is noted.  CARDIOVASCULAR:  2+ radial pulse.  Her precordium was quiet.  She had a regular rate and rhythm.  She had a 3/6 systolic murmur. ABDOMEN:  Soft.  No guarding or rebound.  No tenderness. NEURO:  The patient is very hard of hearing but otherwise awake and alert, and her exam is nonfocal.  FINAL DATA SUMMARY:  From Aug 12, 2011, sodium 131, potassium 3.4, chloride 84, CO2 of 39, BUN 24, creatinine 1.09, glucose was 102, hemoglobin on the day of discharge was 12.8 g.  Echocardiogram:  The patient had a 2D echo performed on August 07, 2011, which revealed left ventricular cavity size to be normal.  There was moderate concentric hypertrophy.  EF was estimated at 45%.  There was diffuse hypokinesis. Severe hypokinesis of the inferolateral and inferior myocardium. Features are consistent with pseudo normal left ventricular filling pattern with concomitant abnormal relaxation and increased filling pressure, resulting in a grade 2 diastolic dysfunction.  The mitral valve was calcified with mild regurgitation.  Tricuspid valve with moderate regurgitation.  DISPOSITION:  The patient will be discharged home.  We will arrange for home health for followup of her CHF.  The patient will also need home oxygen at this time based on her oxygen saturations on room air being 86- 87%.  At this point, we will go with routine concentrator.  FOLLOWUP:  The patient will be seen in the office in 5-6 days.  She will need to arrange a followup appointment with Dr. Jacinto Halim.  The patient will be followed up by Dr. Leone Payor for GI.  CONDITION:  At the time of discharge, the patient is guarded given her advanced age, multiple comorbidities.     Rosalyn Gess Landers Prajapati,  MD     MEN/MEDQ  D:  08/12/2011  T:  08/12/2011  Job:  [id number]  cc:   [name], MD Iva Boop, MD,FACG

## 2011-08-12 NOTE — Progress Notes (Signed)
Patinet without further bleeding, without chest pain. Clincially and by x-ray CHF is markedly better. She does have persistent hypoxemia per notes May 2nd and will need home oxygen.   She is ready for d/c/ home. She will need home health nursing for follow-up CHF and home O2.  Dictation #[id number]  40 minutes on patient exam and care coordination

## 2011-08-12 NOTE — Progress Notes (Signed)
Reviewed NTG with pt. Sts hers is old and she needs a new prescription. Do not see her d/c drug list but will notify RN that pt needs new rx. [id number] Ethelda Chick CES, ACSM

## 2011-08-17 ENCOUNTER — Telehealth: Payer: Self-pay

## 2011-08-17 NOTE — Telephone Encounter (Signed)
OK 

## 2011-08-17 NOTE — Telephone Encounter (Signed)
HHRN called requesting a verbal order for PT - gait training and safety assessment.

## 2011-08-18 NOTE — Telephone Encounter (Signed)
Spoke with Tresa Endo concerning PT order that it is ok to order for patient  Darlene [name]

## 2011-08-19 ENCOUNTER — Telehealth: Payer: Self-pay

## 2011-08-19 NOTE — Telephone Encounter (Signed)
HHRN called to inform MD that pt accidentally took Zoloft 50 mg and Lasik 40 mg twice today. RN advised pt to increase fluid intake today. Pt denies any adverse sxs but RN wanted MD to be aware. Pt's son is currently monitoring pt.

## 2011-08-19 NOTE — Telephone Encounter (Signed)
Ok - i am aware - no adverse effects expected, no tx changes recommended  - resume normal dosing tomorrow - thanks

## 2011-08-19 NOTE — Telephone Encounter (Signed)
Pt advised of same.  

## 2011-08-19 NOTE — Telephone Encounter (Signed)
Please advise in MD's absence, thanks 

## 2011-08-22 ENCOUNTER — Other Ambulatory Visit: Payer: Self-pay | Admitting: *Deleted

## 2011-08-22 MED ORDER — CHLORHEXIDINE GLUCONATE 0.12 % MT SOLN: 15.0000 mL | OROMUCOSAL | Status: DC | PRN

## 2011-08-22 NOTE — Telephone Encounter (Signed)
Med called to pharmacy [facility]. sue

## 2011-08-23 ENCOUNTER — Telehealth: Payer: Self-pay | Admitting: Internal Medicine

## 2011-08-23 NOTE — Telephone Encounter (Signed)
[facility] called and they are reporting the pt has refused the physical therapy.    Thanks!

## 2011-09-02 DIAGNOSIS — I5033 Acute on chronic diastolic (congestive) heart failure: Secondary | ICD-10-CM

## 2011-09-02 DIAGNOSIS — I214 Non-ST elevation (NSTEMI) myocardial infarction: Secondary | ICD-10-CM

## 2011-09-02 DIAGNOSIS — I509 Heart failure, unspecified: Secondary | ICD-10-CM

## 2011-09-02 DIAGNOSIS — K922 Gastrointestinal hemorrhage, unspecified: Secondary | ICD-10-CM

## 2011-09-09 ENCOUNTER — Other Ambulatory Visit: Payer: Self-pay | Admitting: *Deleted

## 2011-09-09 MED ORDER — ESOMEPRAZOLE MAGNESIUM 40 MG PO CPDR: 40.0000 mg | DELAYED_RELEASE_CAPSULE | Freq: Two times a day (BID) | ORAL | Status: DC

## 2011-09-09 NOTE — Telephone Encounter (Signed)
Med refill [facility]Nexium

## 2011-09-12 ENCOUNTER — Ambulatory Visit: Payer: Medicare Other | Admitting: Internal Medicine

## 2011-09-16 ENCOUNTER — Ambulatory Visit: Payer: Medicare Other | Admitting: Internal Medicine

## 2011-09-19 ENCOUNTER — Ambulatory Visit: Payer: Medicare Other | Admitting: Internal Medicine

## 2011-09-28 ENCOUNTER — Telehealth: Payer: Self-pay | Admitting: *Deleted

## 2011-09-28 NOTE — Telephone Encounter (Signed)
Pt is requesting refill of prescription given to her for her RLS-she doesn't remember the name of the medication-she is having problems with her legs jerking and moving again.

## 2011-09-29 ENCOUNTER — Telehealth: Payer: Self-pay | Admitting: *Deleted

## 2011-09-29 NOTE — Telephone Encounter (Signed)
Called patient and spoke with her and her son. They have no transportation.   Patinet and her son told I cannot diagnosis RLS or parkinson's over the phone or prescribe medication without a diagnosis.  Recommended to the son that they contact [employer] with [place] or contact DSS about transportation assistance.

## 2011-09-29 NOTE — Telephone Encounter (Signed)
No existing Dx RLS. Needs OV

## 2011-09-29 NOTE — Telephone Encounter (Signed)
Pt informed of MD's advisement for OV by MEN's LPN this am.

## 2011-09-29 NOTE — Telephone Encounter (Signed)
Patient called this AM requesting medication for RLS be called to [facility]. Patient very upset,crying, very anxious in her voice repeating herself of need for that medicine for her legs, she states she cannot walk across room. Explained to patient Dr. Debby Bud will need to see her with OV for this problem to have medicine. Patient crying into phone again no transportation, no way to get any where car is broken and can not get food either . Explained to patient to call 911 to help her. Patient crying into phone she is not going to do that. Request to speak to Dr Debby Bud. Phone # [phone].please advise.

## 2011-09-29 NOTE — Telephone Encounter (Signed)
Spoke with pt and her son.  They confirmed that she is unable to come in for an apt due to no transportation (as noted in [employer] sent today). She became emotional on the call and was distressed she could not remember the name of the medicine she needs.  I told her I would relay the message and call her back.

## 2011-10-14 ENCOUNTER — Telehealth: Payer: Self-pay

## 2011-10-14 NOTE — Telephone Encounter (Signed)
Patient called requesting to cancel appt due to no transportation. Per pt, she has applied for [employer] citizens transportation service which has not been approved yet, therefore need to cancel appt 10/17/11 with Dr Debby Bud. Appointment with MD c/x in Epic, pt will call back to rescheduled at a later date

## 2011-10-17 ENCOUNTER — Ambulatory Visit: Payer: Medicare Other | Admitting: Internal Medicine

## 2011-10-18 ENCOUNTER — Ambulatory Visit: Payer: Medicare Other | Admitting: Internal Medicine

## 2011-10-19 ENCOUNTER — Emergency Department (HOSPITAL_COMMUNITY): Payer: Medicare Other

## 2011-10-19 ENCOUNTER — Inpatient Hospital Stay (HOSPITAL_COMMUNITY)
Admission: EM | Admit: 2011-10-19 | Discharge: 2011-10-25 | DRG: 377 | Disposition: A | Discharge status: Home / Self Care | Payer: Medicare Other | Attending: Internal Medicine | Admitting: Internal Medicine

## 2011-10-19 ENCOUNTER — Encounter (HOSPITAL_COMMUNITY): Payer: Self-pay | Admitting: *Deleted

## 2011-10-19 DIAGNOSIS — K648 Other hemorrhoids: Secondary | ICD-10-CM | POA: Diagnosis present

## 2011-10-19 DIAGNOSIS — R079 Chest pain, unspecified: Secondary | ICD-10-CM

## 2011-10-19 DIAGNOSIS — K5010 Crohn's disease of large intestine without complications: Secondary | ICD-10-CM

## 2011-10-19 DIAGNOSIS — Z85038 Personal history of other malignant neoplasm of large intestine: Secondary | ICD-10-CM

## 2011-10-19 DIAGNOSIS — K5730 Diverticulosis of large intestine without perforation or abscess without bleeding: Secondary | ICD-10-CM

## 2011-10-19 DIAGNOSIS — K922 Gastrointestinal hemorrhage, unspecified: Secondary | ICD-10-CM

## 2011-10-19 DIAGNOSIS — I252 Old myocardial infarction: Secondary | ICD-10-CM

## 2011-10-19 DIAGNOSIS — D649 Anemia, unspecified: Secondary | ICD-10-CM

## 2011-10-19 DIAGNOSIS — R0602 Shortness of breath: Secondary | ICD-10-CM

## 2011-10-19 DIAGNOSIS — I5033 Acute on chronic diastolic (congestive) heart failure: Secondary | ICD-10-CM | POA: Diagnosis present

## 2011-10-19 DIAGNOSIS — K5733 Diverticulitis of large intestine without perforation or abscess with bleeding: Principal | ICD-10-CM | POA: Diagnosis present

## 2011-10-19 DIAGNOSIS — Z7982 Long term (current) use of aspirin: Secondary | ICD-10-CM

## 2011-10-19 DIAGNOSIS — K219 Gastro-esophageal reflux disease without esophagitis: Secondary | ICD-10-CM | POA: Diagnosis present

## 2011-10-19 DIAGNOSIS — R0789 Other chest pain: Secondary | ICD-10-CM | POA: Diagnosis present

## 2011-10-19 DIAGNOSIS — I200 Unstable angina: Secondary | ICD-10-CM

## 2011-10-19 DIAGNOSIS — I509 Heart failure, unspecified: Secondary | ICD-10-CM | POA: Diagnosis present

## 2011-10-19 DIAGNOSIS — J9600 Acute respiratory failure, unspecified whether with hypoxia or hypercapnia: Secondary | ICD-10-CM | POA: Diagnosis present

## 2011-10-19 DIAGNOSIS — I2510 Atherosclerotic heart disease of native coronary artery without angina pectoris: Secondary | ICD-10-CM | POA: Diagnosis present

## 2011-10-19 LAB — CARDIAC PANEL(CRET KIN+CKTOT+MB+TROPI)
CK, MB: 3.4 ng/mL (ref 0.3–4.0)
CK, MB: 3.6 ng/mL (ref 0.3–4.0)
CK, MB: 3 ng/mL (ref 0.3–4.0)
Relative Index: INVALID (ref 0.0–2.5)
Total CK: 77 U/L (ref 7–177)
Total CK: 72 U/L (ref 7–177)

## 2011-10-19 LAB — CBC
HCT: 28.6 % — ABNORMAL LOW (ref 36.0–46.0)
MCH: 29.3 pg (ref 26.0–34.0)
MCHC: 33.9 g/dL (ref 30.0–36.0)
RDW: 19.3 % — ABNORMAL HIGH (ref 11.5–15.5)

## 2011-10-19 LAB — CBC WITH DIFFERENTIAL/PLATELET
Basophils Relative: 1 % (ref 0–1)
Eosinophils Absolute: 0 10*3/uL (ref 0.0–0.7)
Eosinophils Relative: 1 % (ref 0–5)
Lymphs Abs: 1.6 10*3/uL (ref 0.7–4.0)
MCH: 29.7 pg (ref 26.0–34.0)
MCHC: 32.7 g/dL (ref 30.0–36.0)
MCV: 90.7 fL (ref 78.0–100.0)
Neutrophils Relative %: 65 % (ref 43–77)
Platelets: 140 10*3/uL — ABNORMAL LOW (ref 150–400)
RBC: 1.82 MIL/uL — ABNORMAL LOW (ref 3.87–5.11)
RDW: 18.1 % — ABNORMAL HIGH (ref 11.5–15.5)

## 2011-10-19 LAB — COMPREHENSIVE METABOLIC PANEL
ALT: 13 U/L (ref 0–35)
Albumin: 3.2 g/dL — ABNORMAL LOW (ref 3.5–5.2)
Calcium: 8.6 mg/dL (ref 8.4–10.5)
GFR calc Af Amer: 45 mL/min — ABNORMAL LOW (ref >90)
Glucose, Bld: 123 mg/dL — ABNORMAL HIGH (ref 70–99)
Sodium: 129 mEq/L — ABNORMAL LOW (ref 135–145)
Total Protein: 6 g/dL (ref 6.0–8.3)

## 2011-10-19 LAB — VITAMIN B12: Vitamin B-12: 358 pg/mL (ref 211–911)

## 2011-10-19 LAB — PRO B NATRIURETIC PEPTIDE: Pro B Natriuretic peptide (BNP): 2057 pg/mL — ABNORMAL HIGH (ref 0–450)

## 2011-10-19 LAB — IRON AND TIBC
Saturation Ratios: 54 % (ref 20–55)
UIBC: 155 ug/dL (ref 125–400)

## 2011-10-19 LAB — OCCULT BLOOD, POC DEVICE: Fecal Occult Bld: NEGATIVE

## 2011-10-19 LAB — PROTIME-INR: INR: 0.99 (ref 0.00–1.49)

## 2011-10-19 LAB — TROPONIN I: Troponin I: <0.3 ng/mL (ref <0.30)

## 2011-10-19 LAB — SEDIMENTATION RATE: Sed Rate: 13 mm/hr (ref 0–22)

## 2011-10-19 MED ORDER — SERTRALINE HCL 50 MG PO TABS
50.0000 mg | ORAL_TABLET | Freq: Every day | ORAL | Status: DC
  Administered 2011-10-19 – 2011-10-25 (×7): 50 mg via ORAL
  Filled 2011-10-19 (×7): qty 1

## 2011-10-19 MED ORDER — ALPRAZOLAM 0.5 MG PO TABS
0.5000 mg | ORAL_TABLET | Freq: Four times a day (QID) | ORAL | Status: DC | PRN
  Administered 2011-10-19 – 2011-10-24 (×4): 0.5 mg via ORAL
  Filled 2011-10-19 (×5): qty 1

## 2011-10-19 MED ORDER — SODIUM CHLORIDE 0.9 % IJ SOLN
3.0000 mL | INTRAMUSCULAR | Status: DC | PRN
  Administered 2011-10-24: 3 mL via INTRAVENOUS

## 2011-10-19 MED ORDER — EZETIMIBE 10 MG PO TABS
10.0000 mg | ORAL_TABLET | Freq: Every day | ORAL | Status: DC
  Administered 2011-10-19 – 2011-10-25 (×7): 10 mg via ORAL
  Filled 2011-10-19 (×7): qty 1

## 2011-10-19 MED ORDER — MORPHINE SULFATE 4 MG/ML IJ SOLN
INTRAMUSCULAR | Status: AC
  Filled 2011-10-19: qty 1

## 2011-10-19 MED ORDER — TERAZOSIN HCL 1 MG PO CAPS
1.0000 mg | ORAL_CAPSULE | Freq: Every day | ORAL | Status: DC
  Administered 2011-10-19 – 2011-10-25 (×7): 1 mg via ORAL
  Filled 2011-10-19 (×7): qty 1

## 2011-10-19 MED ORDER — ONDANSETRON HCL 4 MG/2ML IJ SOLN
INTRAMUSCULAR | Status: AC
  Administered 2011-10-19: 4 mg
  Filled 2011-10-19: qty 2

## 2011-10-19 MED ORDER — RAMIPRIL 10 MG PO TABS: 10.0000 mg | ORAL_TABLET | Freq: Every day | ORAL | Status: DC

## 2011-10-19 MED ORDER — SIMVASTATIN 40 MG PO TABS: 40.0000 mg | ORAL_TABLET | Freq: Every day | ORAL | Status: DC

## 2011-10-19 MED ORDER — MORPHINE SULFATE 4 MG/ML IJ SOLN: 4.0000 mg | Freq: Once | INTRAMUSCULAR | Status: DC

## 2011-10-19 MED ORDER — SUPER HIGH VITAMINS/MINERALS PO TABS: 1.0000 | ORAL_TABLET | Freq: Every day | ORAL | Status: DC

## 2011-10-19 MED ORDER — FUROSEMIDE 10 MG/ML IJ SOLN
80.0000 mg | Freq: Once | INTRAMUSCULAR | Status: AC
  Administered 2011-10-19: 80 mg via INTRAVENOUS
  Filled 2011-10-19: qty 8

## 2011-10-19 MED ORDER — SODIUM CHLORIDE 0.9 % IV SOLN: INTRAVENOUS | Status: AC

## 2011-10-19 MED ORDER — METOPROLOL SUCCINATE ER 100 MG PO TB24
100.0000 mg | ORAL_TABLET | Freq: Every day | ORAL | Status: DC
  Administered 2011-10-19 – 2011-10-25 (×7): 100 mg via ORAL
  Filled 2011-10-19 (×7): qty 1

## 2011-10-19 MED ORDER — SODIUM CHLORIDE 0.9 % IV SOLN: 250.0000 mL | INTRAVENOUS | Status: DC | PRN

## 2011-10-19 MED ORDER — NITROGLYCERIN IN D5W 200-5 MCG/ML-% IV SOLN: 2.0000 ug/min | INTRAVENOUS | Status: DC

## 2011-10-19 MED ORDER — ASPIRIN 81 MG PO CHEW
81.0000 mg | CHEWABLE_TABLET | Freq: Every day | ORAL | Status: DC
  Administered 2011-10-19 – 2011-10-25 (×7): 81 mg via ORAL
  Filled 2011-10-19 (×7): qty 1

## 2011-10-19 MED ORDER — LORAZEPAM 0.5 MG PO TABS
0.5000 mg | ORAL_TABLET | Freq: Every day | ORAL | Status: DC
  Administered 2011-10-19 – 2011-10-24 (×6): 0.5 mg via ORAL
  Filled 2011-10-19 (×6): qty 1

## 2011-10-19 MED ORDER — ALBUTEROL SULFATE (5 MG/ML) 0.5% IN NEBU: 2.5000 mg | INHALATION_SOLUTION | RESPIRATORY_TRACT | Status: DC | PRN

## 2011-10-19 MED ORDER — SODIUM CHLORIDE 0.9 % IJ SOLN
3.0000 mL | Freq: Two times a day (BID) | INTRAMUSCULAR | Status: DC
  Administered 2011-10-19 – 2011-10-24 (×9): 3 mL via INTRAVENOUS

## 2011-10-19 MED ORDER — PRAVASTATIN SODIUM 40 MG PO TABS
80.0000 mg | ORAL_TABLET | Freq: Every day | ORAL | Status: DC
  Administered 2011-10-19 – 2011-10-24 (×6): 80 mg via ORAL
  Filled 2011-10-19 (×7): qty 2

## 2011-10-19 MED ORDER — NITROGLYCERIN 0.4 MG SL SUBL: 0.4000 mg | SUBLINGUAL_TABLET | SUBLINGUAL | Status: DC | PRN

## 2011-10-19 MED ORDER — AMLODIPINE BESYLATE 10 MG PO TABS
10.0000 mg | ORAL_TABLET | Freq: Every day | ORAL | Status: DC
  Administered 2011-10-19 – 2011-10-25 (×7): 10 mg via ORAL
  Filled 2011-10-19 (×7): qty 1

## 2011-10-19 MED ORDER — PANTOPRAZOLE SODIUM 40 MG IV SOLR
40.0000 mg | Freq: Two times a day (BID) | INTRAVENOUS | Status: DC
  Administered 2011-10-19: 40 mg via INTRAVENOUS
  Filled 2011-10-19 (×2): qty 40

## 2011-10-19 MED ORDER — RAMIPRIL 10 MG PO CAPS
10.0000 mg | ORAL_CAPSULE | Freq: Every day | ORAL | Status: DC
  Administered 2011-10-19 – 2011-10-25 (×7): 10 mg via ORAL
  Filled 2011-10-19 (×7): qty 1

## 2011-10-19 MED ORDER — ONDANSETRON HCL 4 MG/2ML IJ SOLN: 4.0000 mg | Freq: Once | INTRAMUSCULAR | Status: DC

## 2011-10-19 MED ORDER — HYDROCODONE-ACETAMINOPHEN 5-325 MG PO TABS
1.0000 | ORAL_TABLET | Freq: Two times a day (BID) | ORAL | Status: DC
  Administered 2011-10-19 – 2011-10-25 (×11): 1 via ORAL
  Filled 2011-10-19 (×12): qty 1

## 2011-10-19 MED ORDER — SODIUM CHLORIDE 0.9 % IJ SOLN
3.0000 mL | Freq: Two times a day (BID) | INTRAMUSCULAR | Status: DC
  Administered 2011-10-19 – 2011-10-23 (×5): 3 mL via INTRAVENOUS

## 2011-10-19 MED ORDER — FUROSEMIDE 10 MG/ML IJ SOLN
40.0000 mg | Freq: Two times a day (BID) | INTRAMUSCULAR | Status: DC
  Administered 2011-10-19 – 2011-10-24 (×13): 40 mg via INTRAVENOUS
  Filled 2011-10-19 (×16): qty 4

## 2011-10-19 MED ORDER — NITROGLYCERIN IN D5W 200-5 MCG/ML-% IV SOLN
2.0000 ug/min | Freq: Once | INTRAVENOUS | Status: AC
  Administered 2011-10-19: 10 ug/min via INTRAVENOUS
  Filled 2011-10-19: qty 250

## 2011-10-19 MED ORDER — ADULT MULTIVITAMIN W/MINERALS CH
1.0000 | ORAL_TABLET | Freq: Every day | ORAL | Status: DC
  Administered 2011-10-20 – 2011-10-25 (×6): 1 via ORAL
  Filled 2011-10-19 (×7): qty 1

## 2011-10-19 NOTE — ED Notes (Signed)
Blood Transfusion completed at 0519 AM. No cardiac or respiratory distress.

## 2011-10-19 NOTE — ED Notes (Signed)
Nitro titrated up gradually to .Pt. Reports pain 0/10.

## 2011-10-19 NOTE — ED Notes (Signed)
Oxygen changed to 4 liters nasal cannula with patient maintaining saturation of 95-97%

## 2011-10-19 NOTE — Consult Note (Signed)
I have taken a history, examined the patient and reviewed the chart. I agree with the extender's note, impression and recommendations. Normocytic anemia, h/o colon cancer, FOB negative on admission, STEMI in April, now with CP and CHF with respiratory compromise. Colonoscopy would be the next step to further evaluate her anemia and history of colon cancer however she is not currently fit to undergo colonoscopy.  Meryl Dare MD [facility]

## 2011-10-19 NOTE — ED Provider Notes (Addendum)
History     CSN: [id number]  Arrival date & time 10/19/11  0120   First MD Initiated Contact with Patient 10/19/11 0126      Chief Complaint  Patient presents with  . Chest Pain     HPI The patient reports developing intermittent chest pain shortness of breath over the past week.  She reports when she exerts herself her discomfort becomes more severe.  She denies melena or hematochezia.  She reports severe chest pain this evening that was unrelieved by her own home nitroglycerin.  She was seen by EMS and given aspirin additional nitroglycerin.  Her initial EKG for EMS demonstrated significant anterior lateral ST depression T-wave changes consistent with cardiac ischemia.  On arrival of emergency Gertie Gowda she is now without chest pain or shortness of breath.  Her EKG looks significantly more improved.  She denies fevers or chills.  She's had no cough or congestion.  She has a history of coronary artery disease.  In May 2013 the patient had anemia and a non-ST elevation MI require blood transfusion at that time.  She does not have any intervention completed on the coronary arteries as it was all thought to be secondary to anemia.   Past Medical History  Diagnosis Date  . UTI (urinary tract infection)   . IBS (irritable bowel syndrome)   . Abdominal pain, left lower quadrant   . CAD (coronary artery disease)   . Pneumonia   . HTN (hypertension)   . Hyperlipidemia   . Diverticulosis of colon   . History of colon cancer   . Allergic rhinitis   . Angina   . Cancer   . Arthritis   . Shortness of breath   . Anemia   . Blood transfusion   . Anxiety     Past Surgical History  Procedure Date  . Colectomy     secondary to cancer  . Hemicolectomy 1991    Right  . Angioplasty     Stent x 2  . Appendectomy   . Bilateral salpingoophorectomy   . Breast surgery     benign  . Orif hip fracture 12/2009    Dr Charlann Boxer  . Esophagogastroduodenoscopy 08/09/2011    Procedure:  ESOPHAGOGASTRODUODENOSCOPY (EGD);  Surgeon: Hart Carwin, MD;  Location: [facility] ENDOSCOPY;  Service: Endoscopy;  Laterality: N/A;    Family History  Problem Relation Age of Onset  . Colon cancer Neg Hx   . Breast cancer Neg Hx     History  Substance Use Topics  . Smoking status: Never Smoker   . Smokeless tobacco: Not on file  . Alcohol Use: No    OB History    Grav Para Term Preterm Abortions TAB SAB Ect Mult Living                  Review of Systems  All other systems reviewed and are negative.    Allergies  Antihistamines, chlorpheniramine-type  Home Medications   Current Outpatient Rx  Name Route Sig Dispense Refill  . AMLODIPINE BESYLATE 10 MG PO TABS Oral Take 10 mg by mouth daily.      . ASPIRIN 81 MG PO CHEW Oral Chew 81 mg by mouth daily.    Marland Kitchen ESOMEPRAZOLE MAGNESIUM 40 MG PO CPDR Oral Take 1 capsule (40 mg total) by mouth 2 (two) times daily. 60 capsule 11  . EZETIMIBE 10 MG PO TABS Oral Take 10 mg by mouth daily.    . FUROSEMIDE 40 MG PO  TABS Oral Take 1 tablet (40 mg total) by mouth daily. 30 tablet 11  . HYDROCODONE-ACETAMINOPHEN 5-325 MG PO TABS Oral Take 1 tablet by mouth 2 (two) times daily. For pain 60 tablet 3  . LORAZEPAM 0.5 MG PO TABS Oral Take 1 tablet (0.5 mg total) by mouth at bedtime. 30 tablet 5  . METOPROLOL SUCCINATE ER 100 MG PO TB24 Oral Take 100 mg by mouth daily.      . SUPER HIGH VITAMINS/MINERALS PO TABS Oral Take 1 tablet by mouth daily.      Marland Kitchen NITROGLYCERIN 0.4 MG SL SUBL Sublingual Place 0.4 mg under the tongue every 5 (five) minutes as needed. For chest pain    . PRAVASTATIN SODIUM 80 MG PO TABS Oral Take 80 mg by mouth daily.      Marland Kitchen RAMIPRIL 10 MG PO TABS Oral Take 10 mg by mouth daily.      . SERTRALINE HCL 50 MG PO TABS Oral Take 1 tablet (50 mg total) by mouth daily. 30 tablet 11  . TERAZOSIN HCL 1 MG PO CAPS Oral Take 1 mg by mouth daily.        BP 114/57  Pulse 75  Temp 98.7 F (37.1 C) (Oral)  Resp 16  SpO2  100%  Physical Exam  Nursing note and vitals reviewed. Constitutional: She is oriented to person, place, and time. She appears well-developed and well-nourished. No distress.  HENT:  Head: Normocephalic and atraumatic.  Eyes: EOM are normal.  Neck: Normal range of motion.  Cardiovascular: Normal rate, regular rhythm and normal heart sounds.   Pulmonary/Chest: Effort normal and breath sounds normal.  Abdominal: Soft. She exhibits no distension. There is no tenderness.  Musculoskeletal: Normal range of motion.  Neurological: She is alert and oriented to person, place, and time.  Skin: Skin is warm and dry.  Psychiatric: She has a normal mood and affect. Judgment normal.    ED Course  Procedures (including critical care time)   Date: 10/19/2011  0124  Rate: 71  Rhythm: normal sinus rhythm  QRS Axis: normal  Intervals: normal  ST/T Wave abnormalities: Minimal ST depression in her lateral leads  Conduction Disutrbances: none  Narrative Interpretation:   Old EKG Reviewed: Change from prior EKG     Date: 10/19/2011  0225  Rate: 84  Rhythm: normal sinus rhythm  QRS Axis: normal  Intervals: normal  ST/T Wave abnormalities: Significant anterior lateral ST depression T-wave inversions  Conduction Disutrbances: none  Narrative Interpretation:   Old EKG Reviewed: Change from prior EKG   CRITICAL CARE Performed by: Lyanne Co Total critical care time: 35 Critical care time was exclusive of separately billable procedures and treating other patients. Critical care was necessary to treat or prevent imminent or life-threatening deterioration. Critical care was time spent personally by me on the following activities: development of treatment plan with patient and/or surrogate as well as nursing, discussions with consultants, evaluation of patient's response to treatment, examination of patient, obtaining history from patient or surrogate, ordering and performing treatments and  interventions, ordering and review of laboratory studies, ordering and review of radiographic studies, pulse oximetry and re-evaluation of patient's condition.    Labs Reviewed  CBC WITH DIFFERENTIAL - Abnormal; Notable for the following:    RBC 1.82 (*)     Hemoglobin 5.4 (*)     HCT 16.5 (*)     RDW 18.1 (*)     Platelets 140 (*)     All other components within  normal limits  COMPREHENSIVE METABOLIC PANEL - Abnormal; Notable for the following:    Sodium 129 (*)     Chloride 92 (*)     Glucose, Bld 123 (*)     BUN 24 (*)     Creatinine, Ser 1.20 (*)     Albumin 3.2 (*)     Total Bilirubin 0.2 (*)     GFR calc non Af Amer 39 (*)     GFR calc Af Amer 45 (*)     All other components within normal limits  PRO B NATRIURETIC PEPTIDE - Abnormal; Notable for the following:    Pro B Natriuretic peptide (BNP) 2057.0 (*)     All other components within normal limits  TROPONIN I  PROTIME-INR  TYPE AND SCREEN  PREPARE RBC (CROSSMATCH)  OCCULT BLOOD X 1 CARD TO LAB, STOOL   Dg Chest Portable 1 View  10/19/2011  *RADIOLOGY REPORT*  Clinical Data: Chest pain  PORTABLE CHEST - 1 VIEW  Comparison: The 08/11/2011  Findings: Moderate cardiomegaly.  Kerley B lines at the lung bases. Left pleural effusion.  No pneumothorax.  IMPRESSION: Mild CHF.  Original Report Authenticated By: Donavan Burnet, M.D.    I personally reviewed the imaging tests through PACS system  I reviewed available ER/hospitalization records thought the EMR   1. Unstable angina pectoris   2. Anemia       MDM  Patient presents with profound anemia and profound ischemic changes on EKG.  She presents with symptoms consistent with unstable angina.  When she presented the emergency room initially she was without any chest discomfort in her ST segments in her lateral leads look significantly improved from the EMS leads.  While in the emergency department the patient develop severe left-sided chest pain and shortness of breath  and developed a new significant anterolateral ST depression and T-wave inversions consistent with ischemia.  Given that her hemoglobin was 5 and the majority of her symptoms are likely secondary to severe anemia supply and demand the patient was transfused blood promptly.  Her initial unit that was given was type specific but not typed and crossed.  Units 2 and 3 have been crossed for antibodies.  The patient be admitted to the coronary care unit.  After initiation of the first unit of blood the patient became chest pain-free.  She's had no more chest pain since the blood this began transfusing.  Given the fact that the blood is transfusing quickly her volume status will need to be monitored closely.  I think the patient will be best cared for in the coronary care unit overnight and likely be transitioned out to step down and her telemetry in the morning.  I discussed the case with the hospitalist who will omit the patient.  The patient sees Dr. Jacinto Halim as her cardiologist and he'll need to be involved in her care in the morning time   Lyanne Co, MD 10/19/11 [phone]  i spoke with Dr Jacinto Halim who will evaluate the pt in the hospital.  He agrees with placement of the patient in the CCU overnight  Lyanne Co, MD 10/19/11 [phone]

## 2011-10-19 NOTE — ED Notes (Signed)
First 2 units of blood ran wide open per Dr. Patria Mane.  Advised by Dr. Patria Mane that 3rd unit of blood can be slowed down.  3rd unit running at 250 ml/hr at this time.  Patient resting, pain free at this time.

## 2011-10-19 NOTE — ED Notes (Signed)
The pt arrived by gems from home where the pt has been having chest pain for 3 hours pta .  She had 325mg  of aspirin by gems and a sl nitro .  Pain prior to that of 8/10.  On arrival here no chest pain.  Alert no distress.  Skin warm and dry.  Iv per ems

## 2011-10-19 NOTE — Consult Note (Signed)
CARDIOLOGY CONSULT NOTE  Patient ID: Darlene [name] MRN: [id number] DOB/AGE: 76-10-1920 76 y.o.  Admit date: 10/19/2011 Referring Physician   Primary Physician: [name], MD  Reason for Consultation Chest pain, Abnormal EKG. CAD  HPI: Patient very well known to me. Darlene [name] is a very pleasant 78 year old female which to his currently and has known coronary artery disease was recently discharged in May of 2013 after she presented with very minimal leak and cardiac markers associated with severe anemia due to GI bleed. She was treated medically and was eventually discharged home. She now presents in a similar fashion with chest pain ongoing for the past one week. She has used nitroglycerin at home with relief of chest discomfort however because of worsening chest pain she presented to the emergency department last night and was found to be similarly severely anemic. She was transferred to the coronary intensive care unit due to abnormal EKG and respiratory failure (mild) do to probably edema from severe anemia. She also had markedly abnormal EKG. I've been asked to see her in the setting of severe anemia. I did evaluate and see her this morning and I'm going to follow up in the evening. She received 3 units of packed cells and chest x-ray revealed significant pulmonary venous congestion and also left pleural effusion. Findings are consistent with congestive heart failure. She initially was very short of breath but did feel better after receiving intravenous furosemide. Patient presently feels much better and is not having significant chest discomfort. She still feels very weak. Denies shortness of breath although she is obviously tachypneic. She denies any recent frank bloody stools or dark stools.  Past Medical History  Diagnosis Date  . UTI (urinary tract infection)   . IBS (irritable bowel syndrome)   . Abdominal pain, left lower quadrant   . CAD (coronary artery disease)   .  Pneumonia   . HTN (hypertension)   . Hyperlipidemia   . Diverticulosis of colon   . History of colon cancer   . Allergic rhinitis   . Angina   . Cancer   . Arthritis   . Shortness of breath   . Anemia   . Blood transfusion   . Anxiety      Past Surgical History  Procedure Date  . Hemicolectomy 1991    Right for mgt of colon cancer.  . Angioplasty 1989    Stent x 2  . Appendectomy   . Bilateral salpingoophorectomy   . Breast surgery     benign  . Orif hip fracture 12/2009    Dr Charlann Boxer  . Esophagogastroduodenoscopy 08/09/2011    Procedure: ESOPHAGOGASTRODUODENOSCOPY (EGD);  Surgeon: Hart Carwin, MD;  Location: [facility] ENDOSCOPY;  Service: Endoscopy;  Laterality: N/A;     Family History  Problem Relation Age of Onset  . Colon cancer Neg Hx   . Breast cancer Neg Hx     Social History: History   Social History  . Marital Status: Legally Separated    Spouse Name: N/A    Number of Children: N/A  . Years of Education: N/A   Occupational History  . Retired    Social History Main Topics  . Smoking status: Never Smoker   . Smokeless tobacco: Not on file  . Alcohol Use: No  . Drug Use: No  . Sexually Active: Not on file   Other Topics Concern  . Not on file   Social History Narrative   PHYSICIAN ROSTERCardiology - Dr Lynnell Chad - Dr  GessnerOrtho - Dr Manfred Arch, Son lives w/her and has multiple medical problems. She has family in [place] - a nephew was recently murdered.Daily [employer]affe[employer] - CokesEND OF LIFE CARE: Patient hesitant to decide "I will decide at the time." thus she is a full code. She does not want to be a vegetable.      Prescriptions prior to admission  Medication Sig Dispense Refill  . amLODipine (NORVASC) 10 MG tablet Take 10 mg by mouth daily.        Marland Kitchen aspirin 81 MG chewable tablet Chew 81 mg by mouth daily.      Marland Kitchen esomeprazole (NEXIUM) 40 MG capsule Take 1 capsule (40 mg total) by mouth 2 (two) times daily.  60 capsule  11  . ezetimibe (ZETIA) 10 MG tablet  Take 10 mg by mouth daily.      . furosemide (LASIX) 40 MG tablet Take 1 tablet (40 mg total) by mouth daily.  30 tablet  11  . HYDROcodone-acetaminophen (NORCO) 5-325 MG per tablet Take 1 tablet by mouth 2 (two) times daily. For pain  60 tablet  3  . LORazepam (ATIVAN) 0.5 MG tablet Take 1 tablet (0.5 mg total) by mouth at bedtime.  30 tablet  5  . metoprolol (TOPROL-XL) 100 MG 24 hr tablet Take 100 mg by mouth daily.        . Multiple Vitamins-Minerals (MULTIVITAMIN,TX-MINERALS) tablet Take 1 tablet by mouth daily.        . nitroGLYCERIN (NITROSTAT) 0.4 MG SL tablet Place 0.4 mg under the tongue every 5 (five) minutes as needed. For chest pain      . pravastatin (PRAVACHOL) 80 MG tablet Take 80 mg by mouth daily.        . ramipril (ALTACE) 10 MG tablet Take 10 mg by mouth daily.        . sertraline (ZOLOFT) 50 MG tablet Take 1 tablet (50 mg total) by mouth daily.  30 tablet  11  . terazosin (HYTRIN) 1 MG capsule Take 1 mg by mouth daily.          ROS: General: no fevers/chills/night sweats Eyes: no blurry vision, diplopia, or amaurosis. Has a skin lesion on her left eyelid suggestive of scarring so carcinoma ENT: Mild hearing loss Resp: dyspnea  GI: no abdominal pain, nausea, vomiting, diarrhea, or constipation GU: no dysuria, frequency, or hematuria Skin: no rash Neuro: no headache, numbness, tingling, or weakness of extremities Musculoskeletal: no joint pain or swelling Heme: no bleeding, DVT, or easy bruising Endo: no polydipsia or polyuria    Physical Exam: Blood pressure 103/53, pulse 74, temperature 97.8 F (36.6 C), temperature source Oral, resp. rate 27, height 5\' 5"  (1.651 m), weight 56.3 kg (124 lb 1.9 oz), SpO2 91.00%.   Labs:   Lab Results  Component Value Date   WBC 7.4 10/19/2011   HGB 9.7* 10/19/2011   HCT 28.6* 10/19/2011   MCV 86.4 10/19/2011   PLT 126* 10/19/2011    Lab 10/19/11 0132  NA 129*  K 4.4  CL 92*  CO2 27  BUN 24*  CREATININE 1.20*  CALCIUM  8.6  PROT 6.0  BILITOT 0.2*  ALKPHOS 69  ALT 13  AST 25  GLUCOSE 123*   Lab Results  Component Value Date   CKTOTAL 89 10/19/2011   CKMB 3.6 10/19/2011   TROPONINI <0.30 10/19/2011    Lab Results  Component Value Date   CHOL 139 07/06/2011   CHOL 170 11/09/2010   Lab Results  Component Value Date   HDL 59.80 07/06/2011   HDL 40.98 11/09/2010   Lab Results  Component Value Date   LDLCALC 49 07/06/2011   LDLCALC 82 11/09/2010   Lab Results  Component Value Date   TRIG 149.0 07/06/2011   TRIG 119.0 11/09/2010   Lab Results  Component Value Date   CHOLHDL 2 07/06/2011   CHOLHDL 3 11/09/2010   No results found for this basename: LDLDIRECT      Radiology: Dg Chest Portable 1 View  10/19/2011  *RADIOLOGY REPORT*  Clinical Data: Chest pain  PORTABLE CHEST - 1 VIEW  Comparison: The 08/11/2011  Findings: Moderate cardiomegaly.  Kerley B lines at the lung bases. Left pleural effusion.  No pneumothorax.  IMPRESSION: Mild CHF.  Original Report Authenticated By: Donavan Burnet, M.D.    EKG: 10/19/2011 revealing normal sinus rhythm. 1 mm inferior and lateral ST segment depression cannot rule out ischemia in the inferolateral region. Normal intervals.   ASSESSMENT AND PLAN:  1. Abnormal EKG as dictated above. I suspect demand ischemia due to severe anemia. 2. Acute GI bleed. H./H. has been stable since transfusions yesterday.  3. Acute on chronic diastolic heart failure. Patient has bibasilar crackles suggestive of congestive heart failure. Chest x-ray findings above noted 4. Known coronary artery disease with history of inferior wall myocardial infarction and remote stenting to the right coronary artery. Known 70% stenoses in the mid LAD.   Recommendation: At this point nothing specific from cardiac standpoint. She is obviously not a candidate for any kind of cardiac interventions or further evaluation. I will be on a standby if my assistance is needed. Would gently diurese. In spite of  severe anemia with a hemoglobin of 5.5 g, no significant cardiac enzyme leak suggest that she has got good cardiac reserve. Hence if any further GI workup is necessary, she can be taken up with low risk for cardiovascular morbidity and mortality for her age.   [name], MD 10/19/2011, 7:43 PM

## 2011-10-19 NOTE — Care Management Note (Addendum)
    Page 1 of 2   10/25/2011     10:08:30 AM   CARE MANAGEMENT NOTE 10/25/2011  Patient:  Darlene [name], Darlene [name]   Account Number:  [id number]  Date Initiated:  10/19/2011  Documentation initiated by:  Junius Creamer  Subjective/Objective Assessment:   adm w ch pain, low hgb     Action/Plan:   lives w son, pcp dr Casimiro Needle norins   Anticipated DC Date:  10/25/2011   Anticipated DC Plan:  HOME W HOME HEALTH SERVICES      DC Planning Services  CM consult      [facility] Choice  HOME HEALTH   Choice offered to / List presented to:  C-1 Patient        HH arranged  HH-1 RN  HH-10 DISEASE MANAGEMENT  HH-6 SOCIAL WORKER      HH agency  [facility]   Status of service:  Completed, signed off Medicare Important Message given?   (If response is "NO", the following Medicare IM given date fields will be blank) Date Medicare IM given:   Date Additional Medicare IM given:    Discharge Disposition:  HOME W HOME HEALTH SERVICES  Per UR Regulation:  Reviewed for med. necessity/level of care/duration of stay  If discussed at Long Length of Stay Meetings, dates discussed:    Comments:  10-24-11 [address] Tomi Bamberger, RN,BSN [phone] CM discussed with friend that was picking pt up his concerns with pt taking medications properly. Friend states that pt lives with son and he is disabled. Pt receives Meals on Wheels. CM made referral for above services RN for [employer] and SW for additional resources that [employer] may offer that she will benefit from. SOC to begin within 24-48 hours post d/c.   7/10 9:11a debbie dowell rn,bsn [phone]

## 2011-10-19 NOTE — Progress Notes (Signed)
  Echocardiogram 2D Echocardiogram has been performed.  Darlene [name] 10/19/2011, 5:16 PM

## 2011-10-19 NOTE — H&P (Addendum)
Darlene [name] is an 78 y.o. female.    Pcp: Dr. Arthur Holms Cardilologist: Yates Decamp GI: Stan Head  Chief Complaint: cp HPI: 78 yo female with hx of CAD s/p NSTEMI, angioplasty x2, Gerd, Diverticulosis, Anemia, Colon cancer apparently has c/o substernal chest pressure x1 day,  Pt denies radiation of the pain.  Pt denies fever, chills, cough, palp, sob, n/v,  abd pain, diarrhea, brbpr, black stool.  Pt took slg nitro with some relief, typically after 5-10 minutes.  She was apparently concerned about the cp and so presented to the ED.   In the ED pt was noted to have hgb 5.4  (prev 12.8 on 08/12/2011), and had intermittent T wave flipping. CXR =>mild CHF.  ED requested pt admission for cp, and anemia.   Past Medical History  Diagnosis Date  . UTI (urinary tract infection)   . IBS (irritable bowel syndrome)   . Abdominal pain, left lower quadrant   . CAD (coronary artery disease)   . Pneumonia   . HTN (hypertension)   . Hyperlipidemia   . Diverticulosis of colon   . History of colon cancer   . Allergic rhinitis   . Angina   . Cancer   . Arthritis   . Shortness of breath   . Anemia   . Blood transfusion   . Anxiety     Past Surgical History  Procedure Date  . Colectomy     secondary to cancer  . Hemicolectomy 1991    Right  . Angioplasty     Stent x 2  . Appendectomy   . Bilateral salpingoophorectomy   . Breast surgery     benign  . Orif hip fracture 12/2009    Dr Charlann Boxer  . Esophagogastroduodenoscopy 08/09/2011    Procedure: ESOPHAGOGASTRODUODENOSCOPY (EGD);  Surgeon: Hart Carwin, MD;  Location: [facility] ENDOSCOPY;  Service: Endoscopy;  Laterality: N/A;    Family History  Problem Relation Age of Onset  . Colon cancer Neg Hx   . Breast cancer Neg Hx    Social History:  reports that she has never smoked. She does not have any smokeless tobacco history on file. She reports that she does not drink alcohol or use illicit drugs.  Allergies:  Allergies  Allergen Reactions    . Antihistamines, Chlorpheniramine-Type     All Antihistamines.     (Not in a hospital admission)  Results for orders placed during the hospital encounter of 10/19/11 (from the past 48 hour(s))  CBC WITH DIFFERENTIAL     Status: Abnormal   Collection Time   10/19/11  1:32 AM      Component Value Range Comment   WBC 6.3  4.0 - 10.5 K/uL    RBC 1.82 (*) 3.87 - 5.11 MIL/uL    Hemoglobin 5.4 (*) 12.0 - 15.0 g/dL    HCT 60.4 (*) 54.0 - 46.0 %    MCV 90.7  78.0 - 100.0 fL    MCH 29.7  26.0 - 34.0 pg    MCHC 32.7  30.0 - 36.0 g/dL    RDW 98.1 (*) 19.1 - 15.5 %    Platelets 140 (*) 150 - 400 K/uL    Neutrophils Relative 65  43 - 77 %    Neutro Abs 4.1  1.7 - 7.7 K/uL    Lymphocytes Relative 25  12 - 46 %    Lymphs Abs 1.6  0.7 - 4.0 K/uL    Monocytes Relative 8  3 - 12 %  Monocytes Absolute 0.5  0.1 - 1.0 K/uL    Eosinophils Relative 1  0 - 5 %    Eosinophils Absolute 0.0  0.0 - 0.7 K/uL    Basophils Relative 1  0 - 1 %    Basophils Absolute 0.0  0.0 - 0.1 K/uL   COMPREHENSIVE METABOLIC PANEL     Status: Abnormal   Collection Time   10/19/11  1:32 AM      Component Value Range Comment   Sodium 129 (*) 135 - 145 mEq/L    Potassium 4.4  3.5 - 5.1 mEq/L    Chloride 92 (*) 96 - 112 mEq/L    CO2 27  19 - 32 mEq/L    Glucose, Bld 123 (*) 70 - 99 mg/dL    BUN 24 (*) 6 - 23 mg/dL    Creatinine, Ser 1.61 (*) 0.50 - 1.10 mg/dL    Calcium 8.6  8.4 - 09.6 mg/dL    Total Protein 6.0  6.0 - 8.3 g/dL    Albumin 3.2 (*) 3.5 - 5.2 g/dL    AST 25  0 - 37 U/L    ALT 13  0 - 35 U/L    Alkaline Phosphatase 69  39 - 117 U/L    Total Bilirubin 0.2 (*) 0.3 - 1.2 mg/dL    GFR calc non Af Amer 39 (*) >90 mL/min    GFR calc Af Amer 45 (*) >90 mL/min   TROPONIN I     Status: Normal   Collection Time   10/19/11  1:32 AM      Component Value Range Comment   Troponin I <0.30  <0.30 ng/mL   PROTIME-INR     Status: Normal   Collection Time   10/19/11  2:13 AM      Component Value Range Comment    Prothrombin Time 13.3  11.6 - 15.2 seconds    INR 0.99  0.00 - 1.49   PRO B NATRIURETIC PEPTIDE     Status: Abnormal   Collection Time   10/19/11  2:13 AM      Component Value Range Comment   Pro B Natriuretic peptide (BNP) 2057.0 (*) 0 - 450 pg/mL   TYPE AND SCREEN     Status: Normal (Preliminary result)   Collection Time   10/19/11  2:21 AM      Component Value Range Comment   ABO/RH(D) A POS      Antibody Screen NEG      Sample Expiration 10/22/2011      Unit Number [id number]      Blood Component Type RED CELLS,LR      Unit division 00      Status of Unit ISSUED      Unit tag comment VERBAL ORDERS PER DR CAMPOS      Transfusion Status OK TO TRANSFUSE      Crossmatch Result COMPATIBLE      Unit Number [id number]      Blood Component Type RED CELLS,LR      Unit division 00      Status of Unit ISSUED      Unit tag comment VERBAL ORDERS PER DR CAMPOS      Transfusion Status OK TO TRANSFUSE      Crossmatch Result COMPATIBLE      Unit Number [id number]      Blood Component Type RED CELLS,LR      Unit division 00      Status of Unit ISSUED  Transfusion Status OK TO TRANSFUSE      Crossmatch Result Compatible     PREPARE RBC (CROSSMATCH)     Status: Normal   Collection Time   10/19/11  2:30 AM      Component Value Range Comment   Order Confirmation ORDER PROCESSED BY BLOOD BANK      Dg Chest Portable 1 View  10/19/2011  *RADIOLOGY REPORT*  Clinical Data: Chest pain  PORTABLE CHEST - 1 VIEW  Comparison: The 08/11/2011  Findings: Moderate cardiomegaly.  Kerley B lines at the lung bases. Left pleural effusion.  No pneumothorax.  IMPRESSION: Mild CHF.  Original Report Authenticated By: Donavan Burnet, M.D.    Review of Systems  Constitutional: Negative for fever, chills, weight loss, malaise/fatigue and diaphoresis.  HENT: Negative for hearing loss, ear pain, nosebleeds, congestion, neck pain, tinnitus and ear discharge.   Eyes: Negative for blurred vision, double vision,  photophobia, pain, discharge and redness.  Respiratory: Negative for cough, hemoptysis, sputum production, shortness of breath, wheezing and stridor.   Cardiovascular: Positive for chest pain. Negative for palpitations, orthopnea, claudication, leg swelling and PND.  Gastrointestinal: Negative for heartburn, nausea, vomiting, abdominal pain, diarrhea, constipation, blood in stool and melena.  Genitourinary: Negative for dysuria, urgency, frequency, hematuria and flank pain.  Musculoskeletal: Negative for myalgias, back pain, joint pain and falls.  Skin: Negative for itching and rash.  Neurological: Negative for dizziness, tingling, tremors, sensory change, speech change, focal weakness, seizures, loss of consciousness, weakness and headaches.  Endo/Heme/Allergies: Negative for environmental allergies and polydipsia. Does not bruise/bleed easily.  Psychiatric/Behavioral: Negative for depression, suicidal ideas, hallucinations, memory loss and substance abuse. The patient is not nervous/anxious and does not have insomnia.     Blood pressure 107/57, pulse 72, temperature 98.2 F (36.8 C), temperature source Oral, resp. rate 17, SpO2 93.00%. Physical Exam  Constitutional: She is oriented to person, place, and time. She appears well-developed and well-nourished. No distress.  HENT:  Head: Normocephalic and atraumatic.  Eyes: Pupils are equal, round, and reactive to light. Right eye exhibits no discharge. Left eye exhibits no discharge. No scleral icterus.       Pale conjunctiva  Neck: Normal range of motion. Neck supple. JVD present. No tracheal deviation present. No thyromegaly present.  Cardiovascular: Normal rate and regular rhythm.  Exam reveals no gallop and no friction rub.   Murmur heard.      2/6 sem LLSB  Respiratory: Effort normal. No stridor. No respiratory distress. She has no wheezes. She has rales. She exhibits no tenderness.       Bibasilar L>R  GI: Soft. Bowel sounds are normal.  She exhibits no distension and no mass. There is no tenderness. There is no rebound and no guarding.  Musculoskeletal: Normal range of motion. She exhibits no edema and no tenderness.  Lymphadenopathy:    She has no cervical adenopathy.  Neurological: She is alert and oriented to person, place, and time. She has normal reflexes. She displays normal reflexes. No cranial nerve deficit. She exhibits normal muscle tone. Coordination normal.  Skin: Skin is warm and dry. No rash noted. She is not diaphoretic. No erythema. No pallor.       2x78mm oval area on L medial lower eyelid ? Skin cancer Onychomycosis Heberdens nodules  Psychiatric: She has a normal mood and affect. Her behavior is normal. Judgment and thought content normal.     Assessment/Plan CP Tele, cycle cardiac markers, I suspect that this is related to anemia subsequent  decreased perfusion Nitro iv, zetia, pravastatin, metoprolol, amlodipine,. ramipril Hold asa for now  Anemia: ? Secondary to diverticulosis? H/o EGD 07/2011=>wnl protonix 40mg  iv bid Heme occult stool x3, check iron studies, b12, folate, esr, tsh Pt is currently being transfused 2 units prbc in the ED,  Lasix 40mg  iv to follow, repeat cbc after transfusion Please obtain GI consult this am  CHF Cycle cardiac markers,  Lasix 40mg  iv bid Cont Tridil Cont Ramipril echo BNP in am  CAD s/p angioplasty x2:  Holding asa due to concern for GI bleeding  ? Skin cancer on L lower eyelid:  Needs outpatient follow up:)   Pearson Grippe 10/19/2011, 4:54 AM  Critical care time spent 45 minutes

## 2011-10-19 NOTE — ED Notes (Addendum)
Patient changed from 15 Liters oxygen via non-rebreather to 2 Liters Nasal Cannula.

## 2011-10-19 NOTE — Consult Note (Signed)
[facility] Consult: 9:30 AM 10/19/2011   Referring Provider: Dr Debby Bud.   Primary Care Physician:  [name], MD Primary Gastroenterologist:  Dr. Leone Payor   Reason for Consultation:  anemia  HPI: Darlene [name] is a 78 y.o. female.  Chronic heart failure. Colon cancer with right  Hemicolectomy 1991. Diverticulosis and hemorrhoids on 2006 colonoscopy.  EGD 07/2011 for anemia with normal EGD.  Colonoscopy not repeated due to acute MI of 07/2011. Transfused 4 units of blood with Hgb nadir 7.0.  On chronic ASA 81 mg and BID Nexium.  Remote cardiac stent placed in 1989 2 GI follow up appts have been cancelled due to lack of transportation. Has poor social support, husband in nursing home and son's car is broken.    Admitted early 7/10 with non-radiating chest pain.  She is vague as to duration, triggers. It did not radiate.  No sweats  Thinks she was standing when it arose last PM  Relieved with SL NTG.  On EKG had some T wave flipping, mild CHF on x-ray. Hgb of 5.4, 9.7 following 3  PRBCs.  This compares to 12.8 on 08/12/2011.  Platelets also diminished at 126 but coags normal.   Stool is FOB negative 7/10 but was positive in April. Daily BMs, occasional nausea without emesis or heaves she attributes to her nerves.  She admits to significant anxiety.  No perceived weight loss since 07/2011.  Appetite is generally poor, not a new issue. No dysphagia.  No acid reflux.  No black stools.  May have seen some blood in single stool within the past month. No NSAIDs, just 81 ASA.  Past Medical History  Diagnosis Date  . UTI (urinary tract infection)   . IBS (irritable bowel syndrome)   . Abdominal pain, left lower quadrant   . CAD (coronary artery disease)   . Pneumonia   . HTN (hypertension)   . Hyperlipidemia   . Diverticulosis of colon   . History of colon cancer   . Allergic rhinitis   . Angina   . Cancer   . Arthritis   . Shortness of  breath   . Anemia   . Blood transfusion   . Anxiety     Past Surgical History  Procedure Date  . Colectomy     secondary to cancer  . Hemicolectomy 1991    Right  . Angioplasty     Stent x 2  . Appendectomy   . Bilateral salpingoophorectomy   . Breast surgery     benign  . Orif hip fracture 12/2009    Dr Charlann Boxer  . Esophagogastroduodenoscopy 08/09/2011    Procedure: ESOPHAGOGASTRODUODENOSCOPY (EGD);  Surgeon: Hart Carwin, MD;  Location: [facility] ENDOSCOPY;  Service: Endoscopy;  Laterality: N/A;    Prior to Admission medications   Medication Sig Start Date End Date Taking? Authorizing Provider  amLODipine (NORVASC) 10 MG tablet Take 10 mg by mouth daily.     Yes Historical Provider, MD  aspirin 81 MG chewable tablet Chew 81 mg by mouth daily.   Yes Historical Provider, MD  esomeprazole (NEXIUM) 40 MG capsule Take 1 capsule (40 mg total) by mouth 2 (two) times daily. 09/09/11  Yes Jacques Navy, MD  ezetimibe (ZETIA) 10 MG tablet Take 10 mg by mouth daily.   Yes Historical Provider, MD  furosemide (LASIX) 40 MG tablet Take 1 tablet (40 mg total) by mouth daily. 08/12/11 08/11/12 Yes Jacques Navy, MD  HYDROcodone-acetaminophen (NORCO) 5-325 MG per tablet Take  1 tablet by mouth 2 (two) times daily. For pain 08/12/11  Yes Jacques Navy, MD  LORazepam (ATIVAN) 0.5 MG tablet Take 1 tablet (0.5 mg total) by mouth at bedtime. 07/01/11 06/30/12 Yes Jacques Navy, MD  metoprolol (TOPROL-XL) 100 MG 24 hr tablet Take 100 mg by mouth daily.     Yes Historical Provider, MD  Multiple Vitamins-Minerals (MULTIVITAMIN,TX-MINERALS) tablet Take 1 tablet by mouth daily.     Yes Historical Provider, MD  nitroGLYCERIN (NITROSTAT) 0.4 MG SL tablet Place 0.4 mg under the tongue every 5 (five) minutes as needed. For chest pain   Yes Historical Provider, MD  pravastatin (PRAVACHOL) 80 MG tablet Take 80 mg by mouth daily.     Yes Historical Provider, MD  ramipril (ALTACE) 10 MG tablet Take 10 mg by mouth daily.      Yes Historical Provider, MD  sertraline (ZOLOFT) 50 MG tablet Take 1 tablet (50 mg total) by mouth daily. 08/12/11 08/11/12 Yes Jacques Navy, MD  terazosin (HYTRIN) 1 MG capsule Take 1 mg by mouth daily.     Yes Historical Provider, MD    Scheduled Meds:    . sodium chloride   Intravenous STAT  . amLODipine  10 mg Oral Daily  . aspirin  81 mg Oral Daily  . ezetimibe  10 mg Oral Daily  . furosemide  40 mg Intravenous BID  . HYDROcodone-acetaminophen  1 tablet Oral BID  . LORazepam  0.5 mg Oral QHS  . metoprolol succinate  100 mg Oral Daily  . multivitamin with minerals  1 tablet Oral Daily  . nitroGLYCERIN  2-200 mcg/min Intravenous Once  . ondansetron      . pantoprazole (PROTONIX) IV  40 mg Intravenous Q12H  . pravastatin  80 mg Oral q1800  . ramipril  10 mg Oral Daily  . sertraline  50 mg Oral Daily  . sodium chloride  3 mL Intravenous Q12H  . sodium chloride  3 mL Intravenous Q12H  . terazosin  1 mg Oral Daily   Infusions:  . nitroGLYCERIN 10 mcg/min (10/19/11 0900)   PRN Meds: sodium chloride, albuterol, nitroGLYCERIN, sodium chloride   Allergies as of 10/19/2011 - Review Complete 10/19/2011  Allergen Reaction Noted  . Antihistamines, chlorpheniramine-type  07/06/2011    Family History  Problem Relation Age of Onset  . Colon cancer Neg Hx   . Breast cancer Neg Hx     History   Social History  . Marital Status: Legally Separated    Spouse Name: N/A    Number of Children: N/A  . Years of Education: N/A   Occupational History  . Retired    Social History Main Topics  . Smoking status: Never Smoker   . Smokeless tobacco: Not on file  . Alcohol Use: No  . Drug Use: No  . Sexually Active: Not on file   Other Topics Concern  . Not on file   Social History Narrative   PHYSICIAN ROSTERCardiology - Dr Lynnell Chad - Dr Meyer Russel - Dr Manfred Arch, Son lives w/her and has multiple medical problems. She has family in [place] - a nephew was recently  murdered.Daily Caffeine - CokesEND OF LIFE CARE: Patient hesitant to decide "I will decide at the time." thus she is a full code. She does not want to be a vegetable.     REVIEW OF SYSTEMS: Constitutional:  126 # 07/06/11, no repeat weight for this admit. Uses cane to move about at home.  [facility]  Pulm:  Chronic cough, clear sputum no worse.  Chronic oxygen at home, no increased SOB CV:  Chest pain as above.  No pedal edema.   GU:  No hematuria or frequency.  No oliguria.  GI:  As per HPI Heme:  As per HPI.    Transfusions:  Per HPI Neuro:  Involuntary leg movements.  Significant agitation and anxiety.   Derm:  No rash, sores, itching Endocrine:  No excessive thirst Immunization:  Not querried Travel:  None.  Really never leaves home.    PHYSICAL EXAM: Vital signs in last 24 hours: Temp:  [97.9 F (36.6 C)-98.7 F (37.1 C)] 97.9 F (36.6 C) (07/10 0807) Pulse Rate:  [66-91] 66  (07/10 0900) Resp:  [13-26] 23  (07/10 0900) BP: (98-146)/(44-83) 110/51 mmHg (07/10 0900) SpO2:  [84 %-100 %] 98 % (07/10 0900) FiO2 (%):  [100 %] 100 % (07/10 0807)  General: frail, anxious, dyspneic aged WF Head:  No facial edema  Eyes:  No icterus or conjunctival pallor.  Squamous cell lesion visible in lower led at medial canthus Ears:  HOH, no hearing aids.  Nose:  No drainage Mouth:  Dry, clear MM Neck:  No mass or JVD Lungs:  Non-rebreather oxygen mask in place, actively coughing with whitish, tenacious sputum and SOB.  Crackles right > left side.  Expiratory wheezes.  Heart: RRR with soft, eraly systollic murmer.  Abdomen:  Soft, thin, active BS.  No mass or organomegaly.  No hernia or bruits.   Rectal: not performed, FOB negative on sample from ED   Musc/Skeltl: osteoporotic, kyphotic  Extremities:  No pedal edema  Neurologic:  Oriented x 3.  Well awake and conversant.  Skin:  No rash, no angiomata Tattoos:  none Nodes:  No cervical adenopathy   Psych:  Agitated and anxious.   Cooperative.   Intake/Output from previous day: 07/09 0701 - 07/10 0700 In: 1083.4 [I.V.:33.4; Blood:1050] Out: -  Intake/Output this shift: Total I/O In: 31.6 [I.V.:31.6] Out: 450 [Urine:450]  LAB RESULTS:  Basename 10/19/11 0550 10/19/11 0132  WBC 7.4 6.3  HGB 9.7* 5.4*  HCT 28.6* 16.5*  PLT 126* 140*   BMET Lab Results  Component Value Date   NA 129* 10/19/2011   NA 131* 08/12/2011   NA 131* 08/11/2011   K 4.4 10/19/2011   K 3.4* 08/12/2011   K 3.4* 08/11/2011   CL 92* 10/19/2011   CL 84* 08/12/2011   CL 84* 08/11/2011   CO2 27 10/19/2011   CO2 39* 08/12/2011   CO2 38* 08/11/2011   GLUCOSE 123* 10/19/2011   GLUCOSE 102* 08/12/2011   GLUCOSE 105* 08/11/2011   BUN 24* 10/19/2011   BUN 24* 08/12/2011   BUN 22 08/11/2011   CREATININE 1.20* 10/19/2011   CREATININE 1.09 08/12/2011   CREATININE 1.05 08/11/2011   CALCIUM 8.6 10/19/2011   CALCIUM 9.7 08/12/2011   CALCIUM 9.5 08/11/2011   LFT  Basename 10/19/11 0132  PROT 6.0  ALBUMIN 3.2*  AST 25  ALT 13  ALKPHOS 69  BILITOT 0.2*  BILIDIR --  IBILI --   PT/INR Lab Results  Component Value Date   INR 0.99 10/19/2011   INR 1.07 08/06/2011   INR 1.0 12/10/2008   RADIOLOGY STUDIES: Dg Chest Portable 1 View  10/19/2011  *RADIOLOGY REPORT*  Clinical Data: Chest pain  PORTABLE CHEST - 1 VIEW  Comparison: The 08/11/2011  Findings: Moderate cardiomegaly.  Kerley B lines at the lung bases. Left pleural effusion.  No pneumothorax.  IMPRESSION: Mild CHF.  Original Report Authenticated By: Donavan Burnet, M.D.    ENDOSCOPIC STUDIES: 08/09/11   EGD   Dr Juanda Chance  For Iron def anemia ENDOSCOPIC IMPRESSION:  1) Normal EGD  nothing to account for GI blood loss  RECOMMENDATIONS:  will need colonoscopy after recovering from acute MI  Colonoscopy 2006  IMPRESSION: *  Recurrent normocytic anemia, currently FOB negative. Transfused 3 units with good rise in H & H. EGD in April 2013 normal Hx colon cancer and right hemicolectomy.  2006 Colonoscopy with no  recurrence or polyps *  Non STEMI 07/2011.   Chest pain, likely demand ischemia in setting of anemia.  Cardiac TroponinI normal *  CHF.  BNP 2057.  Significant resp disteress currently.  *  Thrombocytopenia. Has had this intermittently as far back as 2010. *  Anxiety.   PLAN: *  To further workup for possible GI source of anemia, needs colonoscopy.  Currently she is not fit for this study. *  Will advance diet for the time being.    LOS: 0 days   Darlene [name]  10/19/2011, 9:30 AM Pager: [phone]

## 2011-10-19 NOTE — ED Notes (Signed)
Pt reports having midsternal chest pain approx 3 hrs ago. "It just ached, after I got the nitro the pain went away". Pt. Received  Per EMS1 SL nito and 325 of aspirin. Currently pt. Denies pain, 0/10. Denies numbness and tingling. Denies headache. Denies SOB. Pt. Is on 2L [place] at home. Placed on 2L [place] upon rooming.

## 2011-10-19 NOTE — Progress Notes (Signed)
Subjective: Darlene [name] admitted with demand related angina secondary to recurrent LGI bleed with a drop in Hgb to 5.4. Since AM admission she has had 3 units of blood with improvement in Hgb and resolution of chest pain. She has had some respiratory distress and was just given 80 mg IV lasix. BNP was modestly elevated at 2000.   GI consult appreciated. She is not stable enough for colonoscopy. She has had recent EGD that was negative.   Objective: Lab: Lab Results  Component Value Date   WBC 7.4 10/19/2011   HGB 9.7* 10/19/2011   HCT 28.6* 10/19/2011   MCV 86.4 10/19/2011   PLT 126* 10/19/2011   BMET    Component Value Date/Time   NA 129* 10/19/2011 0132   K 4.4 10/19/2011 0132   CL 92* 10/19/2011 0132   CO2 27 10/19/2011 0132   GLUCOSE 123* 10/19/2011 0132   BUN 24* 10/19/2011 0132   CREATININE 1.20* 10/19/2011 0132   CALCIUM 8.6 10/19/2011 0132   GFRNONAA 39* 10/19/2011 0132   GFRAA 45* 10/19/2011 0132   Cardiac Panel (last 3 results)  Basename 10/19/11 1745 10/19/11 1153 10/19/11 0550  CKTOTAL 89 77 --  CKMB 3.6 3.4 --  TROPONINI <0.30 <0.30 <0.30  RELINDX RELATIVE INDEX IS INVALID RELATIVE INDEX IS INVALID --     Imaging: Cxr: PORTABLE CHEST - 1 VIEW  Comparison: The 08/11/2011  Findings: Moderate cardiomegaly. Kerley B lines at the lung bases.  Left pleural effusion. No pneumothorax.  IMPRESSION:  Mild CHF.  Scheduled Meds:   . sodium chloride   Intravenous STAT  . amLODipine  10 mg Oral Daily  . aspirin  81 mg Oral Daily  . ezetimibe  10 mg Oral Daily  . furosemide  40 mg Intravenous BID  . HYDROcodone-acetaminophen  1 tablet Oral BID  . LORazepam  0.5 mg Oral QHS  . metoprolol succinate  100 mg Oral Daily  . multivitamin with minerals  1 tablet Oral Daily  . nitroGLYCERIN  2-200 mcg/min Intravenous Once  . ondansetron      . pravastatin  80 mg Oral q1800  . ramipril  10 mg Oral Daily  . sertraline  50 mg Oral Daily  . sodium chloride  3 mL Intravenous Q12H    . sodium chloride  3 mL Intravenous Q12H  . terazosin  1 mg Oral Daily  . DISCONTD:  morphine injection  4 mg Intravenous Once  . DISCONTD: multivitamin,tx-minerals  1 tablet Oral Daily  . DISCONTD: ondansetron  4 mg Intravenous Once  . DISCONTD: pantoprazole (PROTONIX) IV  40 mg Intravenous Q12H  . DISCONTD: ramipril  10 mg Oral Daily  . DISCONTD: simvastatin  40 mg Oral q1800   Continuous Infusions:   . nitroGLYCERIN 5 mcg/min (10/19/11 1000)   PRN Meds:.sodium chloride, albuterol, ALPRAZolam, nitroGLYCERIN, sodium chloride   Physical Exam: Filed Vitals:   10/19/11 1207  BP: 121/50  Pulse: 80  Temp:   Resp:     Intake/Output Summary (Last 24 hours) at 10/19/11 1909 Last data filed at 10/19/11 1700  Gross per 24 hour  Intake 1125.06 ml  Output    925 ml  Net 200.06 ml   Elderly white woman who is arousable. On venti mask but not in respiratory distress Chest - decreased breath sounds bilaterally with expiratory wheeze, no neck retractions Cor - RRR Abd - soft, BS+       Assessment/Plan: 1. Cardiac - enzymes are negative. Most likely demand angina due to  anemia. 2 D echo pending She is fluid overloaded after transfusion. BNP mildy elevated.  Plan - she just was to receive 80 of lasix, will repeat at midnight  Foley catheter to be able to accurately monitor fluid balance  Daily weights  2. Anemia - LGI bleed. Needs colonosocopy although she has been resistant in the past.  Plan - serial H/H   [name] 10/19/2011, 7:02 PM

## 2011-10-19 NOTE — ED Notes (Signed)
Pt SpO2 on 2L [place] @ 73. Placed on NRB @ 15L, SpO2 93%.

## 2011-10-19 NOTE — ED Notes (Signed)
Critical hgb 5.4 called per lab

## 2011-10-20 ENCOUNTER — Inpatient Hospital Stay (HOSPITAL_COMMUNITY): Payer: Medicare Other

## 2011-10-20 LAB — TYPE AND SCREEN: ABO/RH(D): A POS

## 2011-10-20 LAB — HEMOGLOBIN AND HEMATOCRIT, BLOOD
HCT: 31.4 % — ABNORMAL LOW (ref 36.0–46.0)
Hemoglobin: 10 g/dL — ABNORMAL LOW (ref 12.0–15.0)
Hemoglobin: 10.4 g/dL — ABNORMAL LOW (ref 12.0–15.0)

## 2011-10-20 LAB — PRO B NATRIURETIC PEPTIDE: Pro B Natriuretic peptide (BNP): 5184 pg/mL — ABNORMAL HIGH (ref 0–450)

## 2011-10-20 NOTE — Progress Notes (Signed)
Subjective: Darlene [name] is anxious about her condition and she is reassured that she is improving. She does seem to indicate that she is willing to have colonoscopy this hospitalization.  Objective: Lab: Lab Results  Component Value Date   WBC 7.4 10/19/2011   HGB 10.0* 10/20/2011   HCT 28.8* 10/20/2011   MCV 86.4 10/19/2011   PLT 126* 10/19/2011   Hgb this AM 10.g BMET    Component Value Date/Time   NA 129* 10/19/2011 0132   K 4.4 10/19/2011 0132   CL 92* 10/19/2011 0132   CO2 27 10/19/2011 0132   GLUCOSE 123* 10/19/2011 0132   BUN 24* 10/19/2011 0132   CREATININE 1.20* 10/19/2011 0132   CALCIUM 8.6 10/19/2011 0132   GFRNONAA 39* 10/19/2011 0132   GFRAA 45* 10/19/2011 0132   pBNP 5,184   Imaging: 1 view CXR 7/11 - preliminary: no significant change, mild pulmonary edema Scheduled Meds:   . sodium chloride   Intravenous STAT  . amLODipine  10 mg Oral Daily  . aspirin  81 mg Oral Daily  . ezetimibe  10 mg Oral Daily  . furosemide  40 mg Intravenous BID  . furosemide  80 mg Intravenous Once  . HYDROcodone-acetaminophen  1 tablet Oral BID  . LORazepam  0.5 mg Oral QHS  . metoprolol succinate  100 mg Oral Daily  . multivitamin with minerals  1 tablet Oral Daily  . pravastatin  80 mg Oral q1800  . ramipril  10 mg Oral Daily  . sertraline  50 mg Oral Daily  . sodium chloride  3 mL Intravenous Q12H  . sodium chloride  3 mL Intravenous Q12H  . terazosin  1 mg Oral Daily  . DISCONTD: pantoprazole (PROTONIX) IV  40 mg Intravenous Q12H   Continuous Infusions:   . nitroGLYCERIN Stopped (10/19/11 1100)   PRN Meds:.sodium chloride, albuterol, ALPRAZolam, nitroGLYCERIN, sodium chloride   Physical Exam: Filed Vitals:   10/20/11 0600  BP: 111/44  Pulse: 68  Temp:   Resp: 18    Intake/Output Summary (Last 24 hours) at 10/20/11 0749 Last data filed at 10/20/11 0350  Gross per 24 hour  Intake  56.13 ml  Output   2736 ml  Net -2679.87 ml   For this adm: IO = -1,596  ml  Elderly white woman in no acute distress but anxious HEENT- no change in left eyelid lesion. C&S clear Cor - 2+ radial pulse, RRR Pulm - improved breath sounds compared to yesterday but still with decreased breath sounds at bases. Abd- soft, BS+   Assessment/Plan: 1. Cardiac - negative enzymes. Dr. Jacinto Halim does not feel that she has had an event, rather demand ischemia secondary to anemia. He has cleared her for colonoscopy if needed. She has diuresed nicely - neg 2 liters over last 12 hours, 1.5 l total.  Plan Continue present meds, including scheduled IV lasix  2. Anemia - Hgb holding steady and no sign of recurrent bleed  Plan - continue q12 H/H  Consider for colonoscopy this admission - perhaps tomorrow.  Will transfer to tele bed.    Darlene [name] 10/20/2011, 7:49 AM

## 2011-10-20 NOTE — Progress Notes (Signed)
I have taken an interval history, reviewed the chart and examined the patient. I agree with the extender's note, impression and recommendations. She is not ready for colonoscopy/sedation yet, still on 6 liters O2 by [place] and mildly dyspneic when she talks. Colonoscopy when her cardiopulmonary status substantially improves.   Darlene [name]. Russella Dar MD Clementeen Graham

## 2011-10-20 NOTE — Progress Notes (Signed)
[facility] Progress Note  SUBJECTIVE: No specific complaints.  OBJECTIVE:  Vital signs in last 24 hours: Temp:  [97.8 F (36.6 C)-99 F (37.2 C)] 98.4 F (36.9 C) (07/11 1140) Pulse Rate:  [64-86] 73  (07/11 1140) Resp:  [15-28] 20  (07/11 1140) BP: (93-128)/(33-77) 93/55 mmHg (07/11 1140) SpO2:  [86 %-100 %] 94 % (07/11 1209) FiO2 (%):  [100 %] 100 % (07/11 0800) Weight:  [121 lb 7.6 oz (55.1 kg)] 121 lb 7.6 oz (55.1 kg) (07/11 0441) Last BM Date: 10/18/11 General:    Pleasant white female in NAD. Very Hard of hearing Heart:  Regular rate and rhythm Lungs: Respirations even and unlabored on O2. Bilateral crackles. Abdomen:  Soft, nontender and nondistended. Normal bowel sounds. Extremities:  Without edema. Neurologic:  Alert and oriented,  grossly normal neurologically. Psych:  Cooperative. Normal mood and affect.   Lab Results:  Basename 10/20/11 0515 10/19/11 0550 10/19/11 0132  WBC -- 7.4 6.3  HGB 10.0* 9.7* 5.4*  HCT 28.8* 28.6* 16.5*  PLT -- 126* 140*   BMET  Basename 10/19/11 0132  NA 129*  K 4.4  CL 92*  CO2 27  GLUCOSE 123*  BUN 24*  CREATININE 1.20*  CALCIUM 8.6   LFT  Basename 10/19/11 0132  PROT 6.0  ALBUMIN 3.2*  AST 25  ALT 13  ALKPHOS 69  BILITOT 0.2*  BILIDIR --  IBILI --   PT/INR  Basename 10/19/11 0213  LABPROT 13.3  INR 0.99    Studies/Results: Dg Chest Port 1 View  10/20/2011  *RADIOLOGY REPORT*  Clinical Data: Short of breath.  CHF.  PORTABLE CHEST - 1 VIEW  Comparison: 10/19/2011.  Findings: Cardiopericardial silhouette and mediastinal contours are unchanged.  There has been interval development of bilateral perihilar and basilar predominant airspace disease compatible with pulmonary edema.  Findings compatible with moderate CHF.  More focal airspace disease is present in the right upper lobe which is favored to represent asymmetric/atypical pulmonary edema rather than pneumonia. Retrocardiac atelectasis.   IMPRESSION: Interval development of moderate CHF with right greater than left perihilar predominant airspace disease consistent with edema.  Original Report Authenticated By: Andreas Newport, M.D.   Dg Chest Portable 1 View  10/19/2011  *RADIOLOGY REPORT*  Clinical Data: Chest pain  PORTABLE CHEST - 1 VIEW  Comparison: The 08/11/2011  Findings: Moderate cardiomegaly.  Kerley B lines at the lung bases. Left pleural effusion.  No pneumothorax.  IMPRESSION: Mild CHF.  Original Report Authenticated By: Donavan Burnet, M.D.    ASSESSMENT / PLAN:  1. Recurrent [place] anemia, FOB negative. Hemoglobin was 5.4 on admission, down from 12.8 in May 2013. EGD in April 2013 unrevealing. Patient asks for another day before proceeding with a colonoscopy. She doesn't feel quite strong enough. Patient needs colonoscopy but not sure she is strong enough to tolerate bowel prep at present.  She is still on 6 liter os oxygen by [place]. Will hold off on colonoscopy for now.  2.  Hx of colon cancer, s/p right hemicolectomy. No recurrence at time of last colonoscopy in 2006.  3. CHF. CXR today reveals interval development of moderate CHF, R>L.. T. Currently on 6 liters oxygen per [place]     LOS: 1 day   Willette Cluster  10/20/2011, 2:43 PM

## 2011-10-21 ENCOUNTER — Inpatient Hospital Stay (HOSPITAL_COMMUNITY): Payer: Medicare Other

## 2011-10-21 MED ORDER — HYOSCYAMINE SULFATE 0.125 MG PO TABS
0.1250 mg | ORAL_TABLET | ORAL | Status: DC | PRN
  Administered 2011-10-21: 0.125 mg via ORAL
  Filled 2011-10-21: qty 1

## 2011-10-21 MED ORDER — HYDROCORTISONE ACETATE 25 MG RE SUPP
25.0000 mg | Freq: Two times a day (BID) | RECTAL | Status: DC
  Administered 2011-10-22 – 2011-10-24 (×4): 25 mg via RECTAL
  Filled 2011-10-21 (×11): qty 1

## 2011-10-21 MED ORDER — POLYETHYLENE GLYCOL 3350 17 G PO PACK
17.0000 g | PACK | Freq: Every day | ORAL | Status: DC
  Administered 2011-10-21: 17 g via ORAL
  Filled 2011-10-21 (×3): qty 1

## 2011-10-21 NOTE — Progress Notes (Signed)
Patient ambulated 200 feet in hallway with cane on 3L of O2 saturating 93-94%.  Patient tolerated ambulation well and returned to room and placed on 2L O2 saturating 96%.  Will continue to monitor. Nolon Nations

## 2011-10-21 NOTE — Progress Notes (Signed)
[facility] Progress Note  SUBJECTIVE: feels okay today  OBJECTIVE:  Vital signs in last 24 hours: Temp:  [97.4 F (36.3 C)-98.3 F (36.8 C)] 98.2 F (36.8 C) (07/12 0404) Pulse Rate:  [74-85] 84  (07/12 0900) Resp:  [16-20] 16  (07/12 0404) BP: (97-132)/(55-72) 109/65 mmHg (07/12 0900) SpO2:  [95 %-99 %] 96 % (07/12 0900) Weight:  [158 lb 8.2 oz (71.9 kg)] 158 lb 8.2 oz (71.9 kg) (07/12 0404) Last BM Date: 10/18/11 General:    white female in NAD Heart:  Regular rate and rhythm Lungs: Respirations even and unlabored,decreased breath sounds at bilateral bases but o/w CTA Abdomen:  Soft, nontender and nondistended. Normal bowel sounds. Extremities:  Without edema. Neurologic:  Alert and oriented,  grossly normal neurologically. Psych:  Cooperative. Normal mood and affect.   Lab Results:  Basename 10/21/11 0620 10/20/11 1744 10/20/11 0515 10/19/11 0550 10/19/11 0132  WBC -- -- -- 7.4 6.3  HGB 10.7* 10.4* 10.0* -- --  HCT 32.2* 31.4* 28.8* -- --  PLT -- -- -- 126* 140*   BMET  Basename 10/19/11 0132  NA 129*  K 4.4  CL 92*  CO2 27  GLUCOSE 123*  BUN 24*  CREATININE 1.20*  CALCIUM 8.6   LFT  Basename 10/19/11 0132  PROT 6.0  ALBUMIN 3.2*  AST 25  ALT 13  ALKPHOS 69  BILITOT 0.2*  BILIDIR --  IBILI --   PT/INR  Basename 10/19/11 0213  LABPROT 13.3  INR 0.99    Studies/Results: Dg Chest 2 View  10/21/2011  *RADIOLOGY REPORT*  Clinical Data: Short of breath  CHEST - 2 VIEW  Comparison: Chest radiograph 10/20/2011  Findings: Normal cardiac silhouette.  There is decreased atelectasis in the left lung base compared to prior.  Small left effusion remains.  There is improvement in the perihilar central venous congestion compared to prior.  No pneumothorax.  Lungs are hyperinflated.  Nipple shadow noted on the right.  IMPRESSION:  1.  Improved atelectasis at left lung base. 2.  Improved central venous pulmonary congestion.  Original Report  Authenticated By: Genevive Bi, M.D.   Dg Chest Port 1 View  10/20/2011  *RADIOLOGY REPORT*  Clinical Data: Short of breath.  CHF.  PORTABLE CHEST - 1 VIEW  Comparison: 10/19/2011.  Findings: Cardiopericardial silhouette and mediastinal contours are unchanged.  There has been interval development of bilateral perihilar and basilar predominant airspace disease compatible with pulmonary edema.  Findings compatible with moderate CHF.  More focal airspace disease is present in the right upper lobe which is favored to represent asymmetric/atypical pulmonary edema rather than pneumonia. Retrocardiac atelectasis.  IMPRESSION: Interval development of moderate CHF with right greater than left perihilar predominant airspace disease consistent with edema.  Original Report Authenticated By: Andreas Newport, M.D.     ASSESSMENT / PLAN:  1. Recurrent [place] anemia, FOB negative. Hemoglobin was 5.4 on admission, down from 12.8 in May 2013. EGD in April 2013 unrevealing. Patient needs colonoscopy.Dr. Debby Bud talked with Dr. Russella Dar today. We would like patient to be able to move around on 2 liters without desaturating. She is currently on  6 liters. The Nurse is about to ambulate patient, will see how she does.  2. Hx of colon cancer, s/p right hemicolectomy. No recurrence at time of last colonoscopy in 2006.    LOS: 2 days   Willette Cluster  10/21/2011, 1:26 PM

## 2011-10-21 NOTE — Progress Notes (Signed)
I have taken an interval history, reviewed the chart and examined the patient. I agree with the extender's note, impression and recommendations.   Malcolm T. Stark MD FACG 

## 2011-10-21 NOTE — Progress Notes (Signed)
Subjective: Sitting on the side of the bed eating bkfst. She has no SOB, no c/p  Objective: Lab: Lab Results  Component Value Date   WBC 7.4 10/19/2011   HGB 10.7* 10/21/2011   HCT 32.2* 10/21/2011   MCV 86.4 10/19/2011   PLT 126* 10/19/2011   BMET    Component Value Date/Time   NA 129* 10/19/2011 0132   K 4.4 10/19/2011 0132   CL 92* 10/19/2011 0132   CO2 27 10/19/2011 0132   GLUCOSE 123* 10/19/2011 0132   BUN 24* 10/19/2011 0132   CREATININE 1.20* 10/19/2011 0132   CALCIUM 8.6 10/19/2011 0132   GFRNONAA 39* 10/19/2011 0132   GFRAA 45* 10/19/2011 0132     Imaging: no new imaging   Scheduled Meds:   . amLODipine  10 mg Oral Daily  . aspirin  81 mg Oral Daily  . ezetimibe  10 mg Oral Daily  . furosemide  40 mg Intravenous BID  . HYDROcodone-acetaminophen  1 tablet Oral BID  . LORazepam  0.5 mg Oral QHS  . metoprolol succinate  100 mg Oral Daily  . multivitamin with minerals  1 tablet Oral Daily  . pravastatin  80 mg Oral q1800  . ramipril  10 mg Oral Daily  . sertraline  50 mg Oral Daily  . sodium chloride  3 mL Intravenous Q12H  . sodium chloride  3 mL Intravenous Q12H  . terazosin  1 mg Oral Daily   Continuous Infusions:   . nitroGLYCERIN Stopped (10/19/11 1100)   PRN Meds:.sodium chloride, albuterol, ALPRAZolam, nitroGLYCERIN, sodium chloride   Physical Exam: Filed Vitals:   10/21/11 0404  BP: 126/72  Pulse: 85  Temp: 98.2 F (36.8 C)  Resp: 16   IO total this adm = neg 1,436  Elderly white woman awake, alert and in better spirits HEENT- no change in eyelid lesion left Cor- 2+ radial pulse, Reg rate Pulm - improved breath sounds, dull at the bases Abd- soft. Neuro - HOH, alert, speech clear     Assessment/Plan: 1. Cardiac - stable w/o recurrent chest pain. Will continue tele today.  2. Anemia - Hgb 10.7 and holding steady. Plan h/h qAM         Stable for colonoscopy   [name] 10/21/2011, 8:04 AM

## 2011-10-21 NOTE — Progress Notes (Signed)
Patient declined offer to ambulate in hallway this evening.  Patient stated her stomach is cramping and she does not feel well enough.  Will continue to monitor. Darlene [name]

## 2011-10-21 NOTE — Progress Notes (Signed)
Patient had episode of frank bleeding per rectum while straining to have a BM.  GI NP notified as well as Dr. Debby Bud.  Patient otherwise in no distress.  Will continue to monitor. Nolon Nations

## 2011-10-22 DIAGNOSIS — K922 Gastrointestinal hemorrhage, unspecified: Secondary | ICD-10-CM

## 2011-10-22 LAB — HEMOGLOBIN AND HEMATOCRIT, BLOOD: Hemoglobin: 9.5 g/dL — ABNORMAL LOW (ref 12.0–15.0)

## 2011-10-22 NOTE — Progress Notes (Signed)
[facility] Progress Note  SUBJECTIVE: feels okay today  OBJECTIVE:  Vital signs in last 24 hours: Temp:  [97.8 F (36.6 C)-98.3 F (36.8 C)] 98.3 F (36.8 C) (07/12 2015) Pulse Rate:  [75-80] 77  (07/13 0500) Resp:  [18-20] 18  (07/13 0500) BP: (84-135)/(42-69) 134/54 mmHg (07/13 0500) SpO2:  [93 %-96 %] 96 % (07/13 0500) Weight:  [160 lb (72.576 kg)] 160 lb (72.576 kg) (07/13 0500) Last BM Date: 10/21/11 General:    white female in NAD Lungs: Respirations even and unlabored. Decreased breath sounds at bilateral bases.  Abdomen:  Soft, nontender and nondistended. Normal bowel sounds. Extremities:  Without edema. Neurologic:  Alert and oriented,  grossly normal neurologically. Psych:  Cooperative. Normal mood and affect.   Lab Results:  Basename 10/22/11 0645 10/21/11 2009 10/21/11 0620  WBC -- -- --  HGB 9.5* 10.2* 10.7*  HCT 28.6* 30.8* 32.2*  PLT -- -- --   Studies/Results: Dg Chest 2 View  10/21/2011  *RADIOLOGY REPORT*  Clinical Data: Short of breath  CHEST - 2 VIEW  Comparison: Chest radiograph 10/20/2011  Findings: Normal cardiac silhouette.  There is decreased atelectasis in the left lung base compared to prior.  Small left effusion remains.  There is improvement in the perihilar central venous congestion compared to prior.  No pneumothorax.  Lungs are hyperinflated.  Nipple shadow noted on the right.  IMPRESSION:  1.  Improved atelectasis at left lung base. 2.  Improved central venous pulmonary congestion.  Original Report Authenticated By: Genevive Bi, M.D.     ASSESSMENT / PLAN:  1. Recurrent [place] anemia, FOB negative. Hemoglobin was 5.4 on admission, down from 12.8 in May 2013. EGD in April 2013 unrevealing. Hemoglobin has started to drift again. Patient had some mild rectal bleeding yesterday (with straining). She needs a colonoscopy, we have been waiting on pulmonary status to improve. She is down to 3L oxygen now, sats 96%.  Patient looks much  better today. We discussed proceeding with colonoscopy in am. Patient feels she needs another day to gain strength back. We will prep her tomorrow for colonoscopy on Monday.    2. Hx of colon cancer, s/p right hemicolectomy.    LOS: 3 days   Willette Cluster  10/22/2011, 9:07 AM

## 2011-10-22 NOTE — Progress Notes (Signed)
Subjective: Mrs. Stolze feels much better. Was able to ambulate yesterday while maintaining her O2 sat. No chest pain, no SOB  Objective: Lab: Lab Results  Component Value Date   WBC 7.4 10/19/2011   HGB 9.5* 10/22/2011   HCT 28.6* 10/22/2011   MCV 86.4 10/19/2011   PLT 126* 10/19/2011   BMET    Component Value Date/Time   NA 129* 10/19/2011 0132   K 4.4 10/19/2011 0132   CL 92* 10/19/2011 0132   CO2 27 10/19/2011 0132   GLUCOSE 123* 10/19/2011 0132   BUN 24* 10/19/2011 0132   CREATININE 1.20* 10/19/2011 0132   CALCIUM 8.6 10/19/2011 0132   GFRNONAA 39* 10/19/2011 0132   GFRAA 45* 10/19/2011 0132     Imaging:no new imaging  Scheduled Meds:   . amLODipine  10 mg Oral Daily  . aspirin  81 mg Oral Daily  . ezetimibe  10 mg Oral Daily  . furosemide  40 mg Intravenous BID  . HYDROcodone-acetaminophen  1 tablet Oral BID  . hydrocortisone  25 mg Rectal BID  . LORazepam  0.5 mg Oral QHS  . metoprolol succinate  100 mg Oral Daily  . multivitamin with minerals  1 tablet Oral Daily  . polyethylene glycol  17 g Oral Daily  . pravastatin  80 mg Oral q1800  . ramipril  10 mg Oral Daily  . sertraline  50 mg Oral Daily  . sodium chloride  3 mL Intravenous Q12H  . sodium chloride  3 mL Intravenous Q12H  . terazosin  1 mg Oral Daily   Continuous Infusions:  PRN Meds:.sodium chloride, albuterol, ALPRAZolam, hyoscyamine, nitroGLYCERIN, sodium chloride   Physical Exam: Filed Vitals:   10/22/11 1004  BP: 136/69  Pulse: 78  Resp:   I/O total = - 1,287 Chest good breath sounds, no rales or wheezing Cor RRR Abd - soft      Assessment/Plan: 1. Cardiac - stable. No recurrent c/p. BL stable  2. Anemia - one bloody stool yesterday - Hemorrhoids?. Hgb dropped a little  Plan  F/U h/h   cOLONOSCOPY soon.   Casimiro Needle Lennis Rader 10/22/2011, 10:29 AM

## 2011-10-22 NOTE — Progress Notes (Signed)
I have taken an interval history, reviewed the chart and examined the patient. I agree with the extender's note, impression and recommendations. She had small amounts of BRBPR yesterday. Hb has drifted down to 9.5. Plan for colonoscopy on Monday.  [name] MD Clementeen Graham

## 2011-10-23 MED ORDER — SODIUM CHLORIDE 0.45 % IV SOLN
Freq: Once | INTRAVENOUS | Status: AC
  Administered 2011-10-23: 23:00:00 via INTRAVENOUS

## 2011-10-23 MED ORDER — PEG-KCL-NACL-NASULF-NA ASC-C 100 G PO SOLR
1.0000 | Freq: Once | ORAL | Status: AC
  Administered 2011-10-23: 100 g via ORAL
  Filled 2011-10-23: qty 1

## 2011-10-23 NOTE — Progress Notes (Signed)
[facility] Progress Note  SUBJECTIVE: feels okay, no further rectal bleeding.   OBJECTIVE:  Vital signs in last 24 hours: Temp:  [97.5 F (36.4 C)-98.3 F (36.8 C)] 97.9 F (36.6 C) (07/14 0500) Pulse Rate:  [70-96] 74  (07/14 1055) Resp:  [18-20] 18  (07/14 0500) BP: (95-137)/(55-68) 132/68 mmHg (07/14 1052) SpO2:  [94 %-97 %] 97 % (07/14 0500) Weight:  [117 lb 1 oz (53.1 kg)] 117 lb 1 oz (53.1 kg) (07/14 0500) Last BM Date: 10/22/11 General:    Pleasant white female in NAD, hard of hearing Heart:  Regular rate and rhythm Lungs: Respirations even and unlabored, decreased breath sounds at bilateral bases.  Abdomen:  Soft, nontender and nondistended. Normal bowel sounds. Extremities:  Without edema. Neurologic:  Alert and oriented,  grossly normal neurologically. Psych:  Cooperative. Normal mood and affect.   Lab Results:  Basename 10/23/11 0541 10/22/11 0645 10/21/11 2009  WBC -- -- --  HGB 9.6* 9.5* 10.2*  HCT 29.4* 28.6* 30.8*  PLT -- -- --   ASSESSMENT / PLAN:   Recurrent [place] anemia, FOB negative. EGD in April 2013 unrevealing. Hemoglobin upon this admission was 5.4.   Patient has had some mild rectal bleeding this admission (with straining). She is for am colonoscopy for evaluation of anemia, rectal bleeding and history of colon cancer. Oxygen sats stable at 97% on 2 liters.    LOS: 4 days   Willette Cluster  10/23/2011, 12:09 PM

## 2011-10-23 NOTE — Progress Notes (Signed)
I have taken an interval history, reviewed the chart and examined the patient. I agree with the extender's note, impression and recommendations. Colonoscopy tomorrow for hematochezia, recurrent anemia and history of colon cancer.  Darlene [name]. Russella Dar MD Clementeen Graham

## 2011-10-23 NOTE — Progress Notes (Signed)
Subjective: Feels good. No complaints. Has asked for more food! NO report of further bleeding  Objective: Lab: Lab Results  Component Value Date   WBC 7.4 10/19/2011   HGB 9.6* 10/23/2011   HCT 29.4* 10/23/2011   MCV 86.4 10/19/2011   PLT 126* 10/19/2011   BMET    Component Value Date/Time   NA 129* 10/19/2011 0132   K 4.4 10/19/2011 0132   CL 92* 10/19/2011 0132   CO2 27 10/19/2011 0132   GLUCOSE 123* 10/19/2011 0132   BUN 24* 10/19/2011 0132   CREATININE 1.20* 10/19/2011 0132   CALCIUM 8.6 10/19/2011 0132   GFRNONAA 39* 10/19/2011 0132   GFRAA 45* 10/19/2011 0132     Imaging: no imaging Scheduled Meds:   . amLODipine  10 mg Oral Daily  . aspirin  81 mg Oral Daily  . ezetimibe  10 mg Oral Daily  . furosemide  40 mg Intravenous BID  . HYDROcodone-acetaminophen  1 tablet Oral BID  . hydrocortisone  25 mg Rectal BID  . LORazepam  0.5 mg Oral QHS  . metoprolol succinate  100 mg Oral Daily  . multivitamin with minerals  1 tablet Oral Daily  . polyethylene glycol  17 g Oral Daily  . pravastatin  80 mg Oral q1800  . ramipril  10 mg Oral Daily  . sertraline  50 mg Oral Daily  . sodium chloride  3 mL Intravenous Q12H  . sodium chloride  3 mL Intravenous Q12H  . terazosin  1 mg Oral Daily   Continuous Infusions:  PRN Meds:.sodium chloride, albuterol, ALPRAZolam, hyoscyamine, nitroGLYCERIN, sodium chloride   Physical Exam: Filed Vitals:   10/23/11 1055  BP:   Pulse: 74  Temp:   Resp:     Intake/Output Summary (Last 24 hours) at 10/23/11 1203 Last data filed at 10/23/11 1050  Gross per 24 hour  Intake    963 ml  Output    100 ml  Net    863 ml   Chest - good breath sounds, no wheezing or rales Cor- RRR Abd- soft, BS+, no guarding Total this adm = -[phone]      Assessment/Plan: 1. cardiac - stable. Fluid overload resolved  2. Anemia - stable HGb Plan - colonoscopy in AM  Dispo- home tomorrow PM or Tuesday AM. Will return to see her tomorrow PM    [name] 10/23/2011, 12:02 PM

## 2011-10-24 ENCOUNTER — Encounter (HOSPITAL_COMMUNITY): Payer: Self-pay | Admitting: *Deleted

## 2011-10-24 ENCOUNTER — Encounter (HOSPITAL_COMMUNITY)
Admission: EM | Disposition: A | Discharge status: Home / Self Care | Payer: Self-pay | Source: Home / Self Care | Attending: Internal Medicine

## 2011-10-24 DIAGNOSIS — K5730 Diverticulosis of large intestine without perforation or abscess without bleeding: Secondary | ICD-10-CM

## 2011-10-24 DIAGNOSIS — K5010 Crohn's disease of large intestine without complications: Secondary | ICD-10-CM

## 2011-10-24 HISTORY — PX: COLONOSCOPY: SHX5424

## 2011-10-24 LAB — HEMOGLOBIN AND HEMATOCRIT, BLOOD
HCT: 27 % — ABNORMAL LOW (ref 36.0–46.0)
Hemoglobin: 9.2 g/dL — ABNORMAL LOW (ref 12.0–15.0)

## 2011-10-24 SURGERY — COLONOSCOPY
Anesthesia: Moderate Sedation

## 2011-10-24 MED ORDER — FENTANYL CITRATE 0.05 MG/ML IJ SOLN
INTRAMUSCULAR | Status: AC
  Filled 2011-10-24: qty 4

## 2011-10-24 MED ORDER — GUAIFENESIN 100 MG/5ML PO SYRP
200.0000 mg | ORAL_SOLUTION | ORAL | Status: DC | PRN
Start: 2011-10-24 — End: 2011-10-24
  Filled 2011-10-24: qty 118

## 2011-10-24 MED ORDER — FENTANYL CITRATE 0.05 MG/ML IJ SOLN
INTRAMUSCULAR | Status: DC | PRN
  Administered 2011-10-24 (×3): 25 ug via INTRAVENOUS

## 2011-10-24 MED ORDER — MIDAZOLAM HCL 5 MG/5ML IJ SOLN
INTRAMUSCULAR | Status: DC | PRN
  Administered 2011-10-24 (×3): 2 mg via INTRAVENOUS

## 2011-10-24 MED ORDER — MIDAZOLAM HCL 10 MG/2ML IJ SOLN
INTRAMUSCULAR | Status: AC
  Filled 2011-10-24: qty 4

## 2011-10-24 MED ORDER — GUAIFENESIN 100 MG/5ML PO SYRP
200.0000 mg | ORAL_SOLUTION | ORAL | Status: DC | PRN
  Filled 2011-10-24: qty 118

## 2011-10-24 MED ORDER — SODIUM CHLORIDE 0.9 % IV SOLN
Freq: Once | INTRAVENOUS | Status: AC
  Administered 2011-10-24: 500 mL via INTRAVENOUS

## 2011-10-24 MED ORDER — GUAIFENESIN 100 MG/5ML PO SOLN
10.0000 mL | ORAL | Status: DC | PRN
  Filled 2011-10-24: qty 10

## 2011-10-24 MED ORDER — DIPHENHYDRAMINE HCL 50 MG/ML IJ SOLN
INTRAMUSCULAR | Status: AC
  Filled 2011-10-24: qty 1

## 2011-10-24 NOTE — Progress Notes (Signed)
SATURATION QUALIFICATIONS:  Patient Saturations on Room Air at Rest = 95%  Patient Saturations on Room Air while Ambulating = 91%  Patient Saturations on 0 Liters of oxygen while Ambulating = 91%  Patient averaged an oxygen saturation of 90-91% while ambulating 400 feet on room air.  88% was the lowest she reached during ambulation.  Patient returned to room and saturated 95% on room air. Will continue to monitor. Nolon Nations

## 2011-10-24 NOTE — Evaluation (Signed)
Physical Therapy Evaluation Patient Details Name: Darlene [name] MRN: [id number] DOB: December 21, 1920 Today's Date: 10/24/2011 Time: 7829-5621 PT Time Calculation (min): 15 min  PT Assessment / Plan / Recommendation Clinical Impression  Patient admitted with rectal bleeding and anemia. She is pending discharge today post colonoscopy. It appears that since patient has been on home oxygen she has become predominately house bound. She presents at a modifed indep. level for all her mobilty and appears at her baseline.     PT Assessment  Patent does not need any further PT services    Follow Up Recommendations  No PT follow up;Supervision - Intermittent    Barriers to Discharge  None      Equipment Recommendations  None recommended by PT    Recommendations for Other Services  None   Frequency  N/A   Precautions / Restrictions Precautions Precautions: Fall   Pertinent Vitals/Pain VSS/ No pain      Mobility  Bed Mobility Bed Mobility: Supine to Sit;Sit to Supine Supine to Sit: 6: Modified independent (Device/Increase time) Sit to Supine: 6: Modified independent (Device/Increase time) Transfers Transfers: Sit to Stand;Stand to Sit Sit to Stand: 6: Modified independent (Device/Increase time);From bed Stand to Sit: 6: Modified independent (Device/Increase time);To bed Ambulation/Gait Ambulation/Gait Assistance: 6: Modified independent (Device/Increase time) Ambulation Distance (Feet): 120 Feet Assistive device: Straight cane Ambulation/Gait Assistance Details: Bil foot pronation.  Gait Pattern: Step-through pattern;Decreased stride length;Wide base of support Stairs: No     Visit Information  Last PT Received On: 10/24/11 Assistance Needed: +1    Subjective Data  Subjective: Patient reports that she does not leave the house without asssitance. She states she had no problems moving around in the house until she became ill. Patient Stated Goal: Home   Prior Functioning  Home Living Lives With: Son Available Help at Discharge: Family;Available PRN/intermittently Type of Home: House Home Access: Stairs to enter Entrance Stairs-Number of Steps: 4 - states uses the wall and assistance of another to help.  Entrance Stairs-Rails: None Home Layout: One level;Laundry or work area in basement (Patient does not go to basement) Dentist: Bedside commode/3-in-1;Straight cane Prior Function Level of Independence: Needs assistance Needs Assistance: Light Housekeeping;Meal Prep Meal Prep: Moderate Light Housekeeping: Moderate Able to Take Stairs?: Yes (with assistance) Driving: No Vocation: Retired Musician: Clinical cytogeneticist  Arousal/Alertness: Awake/alert Orientation Level: Appears intact for tasks assessed Behavior During Session: [facility] for tasks performed Cognition - Other Comments: Constantly repeating questions    Extremity/Trunk Assessment Right Lower Extremity Assessment RLE ROM/Strength/Tone: WFL for tasks assessed RLE Coordination: WFL - gross/fine motor Left Lower Extremity Assessment LLE ROM/Strength/Tone: WFL for tasks assessed LLE Coordination: WFL - gross/fine motor   Balance High Level Balance High Level Balance Activites: Turns;Sudden stops;Head turns;Direction changes High Level Balance Comments: Modified indep. with use of cane  End of Session PT - End of Session Equipment Utilized During Treatment: Gait belt Activity Tolerance: Patient tolerated treatment well Patient left: in bed (for procedure) Nurse Communication: Mobility status   Edwyna Perfect, PT  Pager [phone]  10/24/2011, 10:04 AM

## 2011-10-24 NOTE — Progress Notes (Signed)
Subjective: Patient s/p colonoscopy today - no complications, no malignancy. Feeling well with no complaints  Objective: Lab: Lab Results  Component Value Date   WBC 7.4 10/19/2011   HGB 9.2* 10/24/2011   HCT 27.0* 10/24/2011   MCV 86.4 10/19/2011   PLT 126* 10/19/2011   BMET    Component Value Date/Time   NA 129* 10/19/2011 0132   K 4.4 10/19/2011 0132   CL 92* 10/19/2011 0132   CO2 27 10/19/2011 0132   GLUCOSE 123* 10/19/2011 0132   BUN 24* 10/19/2011 0132   CREATININE 1.20* 10/19/2011 0132   CALCIUM 8.6 10/19/2011 0132   GFRNONAA 39* 10/19/2011 0132   GFRAA 45* 10/19/2011 0132     Imaging:  Scheduled Meds:   . sodium chloride   Intravenous Once  . sodium chloride   Intravenous Once  . amLODipine  10 mg Oral Daily  . aspirin  81 mg Oral Daily  . ezetimibe  10 mg Oral Daily  . furosemide  40 mg Intravenous BID  . HYDROcodone-acetaminophen  1 tablet Oral BID  . hydrocortisone  25 mg Rectal BID  . LORazepam  0.5 mg Oral QHS  . metoprolol succinate  100 mg Oral Daily  . multivitamin with minerals  1 tablet Oral Daily  . pravastatin  80 mg Oral q1800  . ramipril  10 mg Oral Daily  . sertraline  50 mg Oral Daily  . sodium chloride  3 mL Intravenous Q12H  . sodium chloride  3 mL Intravenous Q12H  . terazosin  1 mg Oral Daily   Continuous Infusions:  PRN Meds:.sodium chloride, albuterol, ALPRAZolam, guaiFENesin, hyoscyamine, nitroGLYCERIN, sodium chloride, DISCONTD: fentaNYL, DISCONTD: guaifenesin, DISCONTD: guaifenesin, DISCONTD: midazolam   Physical Exam: Filed Vitals:   10/24/11 1749  BP: 92/60  Pulse:   Temp:   Resp:    Total I/O this adm = -574 Cor - RRR PUlm - good breath sounds, no oxygen Abd - soft w/ + BS    Assessment/Plan:  Hgb down a little bit but no report of bleeding. No chest pain, no fluid overload.  For d/c in AM   Michael Norins 10/24/2011, 8:00 PM

## 2011-10-24 NOTE — Interval H&P Note (Signed)
History and Physical Interval Note: Tolerated prep.   Chart reviewed.  10/24/2011 12:30 PM  Darlene [name]  has presented today for surgery, with the diagnosis of anemia, rectal bleeding, history of colon cancer  The various methods of treatment have been discussed with the patient and family. After consideration of risks, benefits and other options for treatment, the patient has consented to  Procedure(s) (LRB): COLONOSCOPY (N/A) as a surgical intervention .  The patient's history has been reviewed, patient examined, no change in status, stable for surgery.  I have reviewed the patients' chart and labs.  Questions were answered to the patient's satisfaction.     Beverley Fiedler, MD

## 2011-10-24 NOTE — Op Note (Addendum)
[name] [address] [place], [place]  [address]  COLONOSCOPY PROCEDURE REPORT  PATIENT:  Darlene, [name]  MR#:  [id number] BIRTHDATE:  [birth year], 89 yrs. old  GENDER:  female ENDOSCOPIST:  Carie Caddy. Sondos Wolfman, MD REF. BY:  Rosalyn Gess. Norins, M.D. PROCEDURE DATE:  10/24/2011 PROCEDURE:  Colon with cold biopsy polypectomy, Colonoscopy with biopsy ASA CLASS:  Class III INDICATIONS:  persistent, recurrent anemia, history of colon cancer s/p right hemicolectomy MEDICATIONS:   Fentanyl 75 mcg IV, Versed 6 mg IV  DESCRIPTION OF PROCEDURE:   After the risks benefits and alternatives of the procedure were thoroughly explained, informed consent was obtained.  Digital rectal exam was performed and revealed no rectal masses.   The Pentax Ped Colon [id number] endoscope was introduced through the anus and advanced to the ileum, without limitations.  The quality of the prep was good, using MoviPrep.  The instrument was then slowly withdrawn as the colon was fully examined. <<PROCEDUREIMAGES>>  FINDINGS:  The neo-terminal ileum appeared normal and the anastomosis appeared healthy.  Mild segmental colitis was found in the sigmoid colon, from approximately 20 cm to 28 cm from the dentate line. Multiple biopsies were obtained and sent to pathology.  A 3 mm sessile polyp was found in the sigmoid colon. The polyp was removed using cold biopsy forceps.   Mild diverticulosis in the sigmoid colon. There were several diverticuli in the area of sigmoid colitis. Retroflexed views in the rectum revealed internal hemorrhoids.  The scope was then withdrawn in  minutes from the cecum and the procedure completed.  COMPLICATIONS:  None ENDOSCOPIC IMPRESSION: 1) Normal neo-terminal ileum. Healthy-appearing anastomosis. 2) Segmental colitis in the sigmoid colon, query mild ischemia. Multiple biopsies obtained and sent to pathology. 3) Sessile polyp in the sigmoid colon. Removed  and sent to pathology. 4) Mild sigmoid diverticulosis 5) Internal hemorrhoids  RECOMMENDATIONS: 1) Await pathology results 2) Avoid all NSAIDs when possible 3) Continue current medications  Cozetta Seif M. Rhea Belton, MD  CC:  The Patient Jacques Navy, MD  n. REVISED:  10/24/2011 01:14 PM eSIGNED:   Carie Caddy. Staisha Winiarski at 10/24/2011 01:14 PM  Ignacia Felling, [phone]

## 2011-10-25 ENCOUNTER — Encounter: Payer: Self-pay | Admitting: Internal Medicine

## 2011-10-25 LAB — HEMOGLOBIN AND HEMATOCRIT, BLOOD: Hemoglobin: 8.8 g/dL — ABNORMAL LOW (ref 12.0–15.0)

## 2011-10-25 MED ORDER — GUAIFENESIN 100 MG/5ML PO SOLN: 10.0000 mL | ORAL | Status: DC | PRN

## 2011-10-25 NOTE — Discharge Summary (Signed)
Darlene [name], Darlene [name]             ACCOUNT NO.:  [id number]  MEDICAL RECORD NO.:  [id number]  LOCATION:  3734                         FACILITY:  [facility]  PHYSICIAN:  [name], MD  DATE OF BIRTH:  1921/02/02  DATE OF ADMISSION:  10/19/2011 DATE OF DISCHARGE:  10/25/2011                              DISCHARGE SUMMARY   ADMITTING DIAGNOSES: 1. Profound anemia with lower gastrointestinal bleed. 2. Stress-induced angina. 3. Hypertension. 4. History of congestive heart failure.  DISCHARGE DIAGNOSES: 1. Lower gastrointestinal bleed, resolved, status post 3 unit     transfusion. 2. Angina, resolved with the restoration of normal hemoglobin. 3. Congestive heart failure, the patient with pulmonary edema and     fluid overload, which is resolved.  The patient doing well on room     air. 4. Hypertension, stable.  CONSULTANTS:  [name], MD, Cardiology, [name], MD, [facility], GI.  PROCEDURES: 1. Chest x-ray at the time of admission, which showed mild CHF. 2. Chest x-ray on July 11, which showed interval development of     moderate CHF with right greater than left perihilar predominant     airspace disease, consistent with edema. 3. Chest x-ray on 07/12 which showed improved atelectasis at left lung     base.  Improved central venous pulmonary congestion. 4. Colonoscopy with normal neoterminal ileum.  Healthy-appearing     anastomosis.  Segmental colitis in the sigmoid colon, query mild     ischemia.  Sessile polyp in the sigmoid colon, removed.  Mild     sigmoid diverticulosis noted.  Internal hemorrhoids noted.  HISTORY OF PRESENT ILLNESS:  The patient is an 78 year old woman who is very hard of hearing with a history of CAD, status post non-STEMI MI and angioplasty x2.  She was recently hospitalized for non-STEMI secondary to profound anemia.  The patient also carries a diagnoses of GERD, diverticulosis, history of colon cancer in the past.  The  patient presents complaining of substernal chest pressure x1 day without radiation.  She denied any fever, chills, cough, palpitations, shortness of breath, nausea, vomiting, or abdominal pain.  She does report having had black stools or bright red blood per rectum.  She did take some nitroglycerin with relief of her discomfort.  She was concerned about chest pain, presented to the emergency department.  Her initial evaluation was significant for intermittent T-wave inversion. Hemoglobin was 5.4 g down from 12.8 g on Aug 12, 2011.  Chest x-ray with mild CHF.  The patient subsequently admitted for treatment of profound anemia and stress-induced angina.  Please see the H and P plus other epic records for past medical history, family history, social history and admission examination.  HOSPITAL COURSE: 1. Cardiac:  The patient presented with significant chest pain.  She     did have a significant anemia and it was felt her chest discomfort     was stress induced.  She was seen in consultation by Dr. Jacinto Halim who     did not feel the patient required any Cardiology interventions.     The patient's cardiac profiles x3 were negative with a CK-MBs of     3.4, 3.6, 3.0 and  troponin I of less than 0.30 x4.  The patient did     have an elevated BNP of 5184.  The patient's angina did improve     after she was transfused.  No further Cardiology workup was     indicated. 2. Anemia.  The patient with fairly profound anemia with a hemoglobin     of 5.4 g.  She was transfused 3 units of packed red cells with     resolving rise in hemoglobin to approximately 9.8.  She remained     stable, although her hemoglobin has drifted down slightly to 8.8 on     the day of discharge.  She has had a scant amount of bleeding with     a bowel movement.  The patient did have a colonoscopy with findings     as above.  With the patient hemoglobin being stable, with     colonoscopy being done with no further workup  indicated, at this     point, she is stable for discharge to home.  Will need to see her     in the office within 7 days with followup H and H to ensure that     she is stable.  Probable bleeding source is diverticulosis with     diverticular bleed versus possible hemorrhoidal bleeding. 3. CHF:  The patient did receive 3 units of packed cells as well as IV     fluids.  She developed significant pulmonary edema and required 6 L     of oxygen and venturi face mask to maintain oxygen levels.  She was     vigorously diuresed, the final I and O balance at time of discharge     of -1658 mL.  The patient's lungs did clear.  She was able to be     maintained on room air with no respiratory difficulty.  The     patient, as an outpatient, will continue on diuretic therapy. 4. With the patient's angina resolved, with no bump in cardiac enzymes     and need for further cardiac evaluation, with her hemoglobin having     been stabilized; although slightly drifting down, with no signs of     active bleeding and colonoscopy being completed, with her vital     signs being stable and with her having no respiratory distress or     other discomfort, at this point she is ready for discharged to     home.  The patient was seen by physical therapy.  It was felt she     did not need any PT follow up for additional assistance.  DISCHARGE EXAMINATION:  VITAL SIGNS:  Temperature was 98.3, blood pressure was 92/48, pulse 72, respirations 18, O2 sats 92% on room air. GENERAL APPEARANCE:  This is a very elderly white woman in no acute distress.  She is very hard of hearing. HEENT:  Conjunctiva and sclerae was clear.  Pupils equal, round, and reactive. CHEST:  The patient is moving air well with no increased work of breathing.  No rales or wheezes were appreciated.  CARDIOVASCULAR:  2+ radial pulses.  Precordium is quiet.  She has a regular rate and rhythm. ABDOMEN:  Soft.  No guarding or rebound.  No  organosplenomegaly was appreciated. NEUROLOGIC:  The patient is hard of hearing.  Otherwise her exam is nonfocal.  FINAL LABORATORY:  Iron studies with an iron level of 181, TIBC of 336, iron saturation 54%.  RBC folate was  2812, vitamin B12 was 358.  Final chemistry profile from July 10 with a sodium of 129, potassium 4.4, chloride 92, CO2 of 27, BUN 24, creatinine 1.20.  Glucose was 123. Liver functions were normal.  Final hemoglobin 8.8 g with a hematocrit 26.3%.  TSH on October 19, 2011 was normal at 4.151.  DISCHARGE MEDICATIONS:  Norvasc 10 mg daily, aspirin 81 mg daily, Zetia 10 mg daily, Lasix 40 mg daily, Anusol suppository with hydrocortisone p.r.n., Ativan 0.5 mg at bedtime, Toprol-XL 100 mg daily, Pravachol 80 mg daily, Altace 10 mg daily, sertraline 50 mg daily, Hytrin 1 mg p.o. daily.  DISPOSITION:  The patient was discharged home.  She will follow a regular diet.  She will be seen in the office in 5-7 days.  The patient's condition at time of discharge dictation is stable.     Rosalyn Gess Tenee Wish, MD     MEN/MEDQ  D:  10/25/2011  T:  10/25/2011  Job:  [id number]  cc:   [name], MD

## 2011-10-25 NOTE — Progress Notes (Signed)
Reviewed discharge instructions with patient, she stated her understanding.  Patient's friend here to pick her up and states he is worried about patient and sons ability to care for her.  Spoke with Dr. Debby Bud and home health RN/CSW ordered.  Colman Cater

## 2011-10-25 NOTE — Progress Notes (Signed)
Patient s/p colonoscopy - diverticulosis, one polyp, internal hemorrhoids. Hgb has drifted to 8.8 angina resolved.  Patient ready for d/c home with follow-up in 5-7 days           Dictation # [id number]

## 2011-10-27 ENCOUNTER — Telehealth: Payer: Self-pay | Admitting: Internal Medicine

## 2011-10-27 DIAGNOSIS — K922 Gastrointestinal hemorrhage, unspecified: Secondary | ICD-10-CM

## 2011-10-27 DIAGNOSIS — D649 Anemia, unspecified: Secondary | ICD-10-CM

## 2011-10-27 DIAGNOSIS — I2510 Atherosclerotic heart disease of native coronary artery without angina pectoris: Secondary | ICD-10-CM

## 2011-10-27 NOTE — Telephone Encounter (Signed)
Message copied by Newell Coral on Thu Oct 27, 2011  8:45 AM ------      Message from: Jacques Navy      Created: Tue Oct 25, 2011  8:47 AM       Needs f/u appt in 5-7 days - Monday would be good. thanks

## 2011-10-27 NOTE — Telephone Encounter (Signed)
Spoke with the patient and her son. She is unable to come in until the 25th due to transportation problems.  She is hoping in the time before her apt, she can get a medicine called in for "leg jerking".  I told her if she was able to get a ride sooner, to call us back and we would see what arrangements could be made.    Thanks!

## 2011-11-01 ENCOUNTER — Telehealth: Payer: Self-pay | Admitting: *Deleted

## 2011-11-01 NOTE — Telephone Encounter (Signed)
Tresa Endo, nurse advanced home care, called . Patient concern and very anxious continues to have large amounts of bleeding when feels pressure when going to bathroom .states she is not straining  When this  Happens. States also on toilet tissue. States taking colase in am and pm.  Nurse kelly faxed CBC lab work done Friday. Dr. Debby Bud aware of these results. Orders noted to kelly to repeat only hgb and hct today. Dr. Debby Bud states patient should also be taking Metamucil Bulk Laxative also.Marland Kitchen Kelly to check on this and repeat lab HGB and HCT today

## 2011-11-02 ENCOUNTER — Encounter (HOSPITAL_COMMUNITY): Payer: Self-pay | Admitting: *Deleted

## 2011-11-02 ENCOUNTER — Inpatient Hospital Stay (HOSPITAL_COMMUNITY)
Admission: AD | Admit: 2011-11-02 | Discharge: 2011-11-04 | DRG: 811 | Disposition: A | Discharge status: Home Health | Payer: Medicare Other | Source: Ambulatory Visit | Attending: Internal Medicine | Admitting: Internal Medicine

## 2011-11-02 DIAGNOSIS — K648 Other hemorrhoids: Secondary | ICD-10-CM | POA: Diagnosis present

## 2011-11-02 DIAGNOSIS — I10 Essential (primary) hypertension: Secondary | ICD-10-CM

## 2011-11-02 DIAGNOSIS — Z79899 Other long term (current) drug therapy: Secondary | ICD-10-CM

## 2011-11-02 DIAGNOSIS — Z9049 Acquired absence of other specified parts of digestive tract: Secondary | ICD-10-CM

## 2011-11-02 DIAGNOSIS — J309 Allergic rhinitis, unspecified: Secondary | ICD-10-CM | POA: Diagnosis present

## 2011-11-02 DIAGNOSIS — E785 Hyperlipidemia, unspecified: Secondary | ICD-10-CM | POA: Diagnosis present

## 2011-11-02 DIAGNOSIS — Z85038 Personal history of other malignant neoplasm of large intestine: Secondary | ICD-10-CM

## 2011-11-02 DIAGNOSIS — K5731 Diverticulosis of large intestine without perforation or abscess with bleeding: Secondary | ICD-10-CM | POA: Diagnosis present

## 2011-11-02 DIAGNOSIS — I252 Old myocardial infarction: Secondary | ICD-10-CM

## 2011-11-02 DIAGNOSIS — Z888 Allergy status to other drugs, medicaments and biological substances status: Secondary | ICD-10-CM

## 2011-11-02 DIAGNOSIS — M129 Arthropathy, unspecified: Secondary | ICD-10-CM | POA: Diagnosis present

## 2011-11-02 DIAGNOSIS — Z9089 Acquired absence of other organs: Secondary | ICD-10-CM

## 2011-11-02 DIAGNOSIS — D649 Anemia, unspecified: Secondary | ICD-10-CM

## 2011-11-02 DIAGNOSIS — I2510 Atherosclerotic heart disease of native coronary artery without angina pectoris: Secondary | ICD-10-CM | POA: Diagnosis present

## 2011-11-02 DIAGNOSIS — K922 Gastrointestinal hemorrhage, unspecified: Secondary | ICD-10-CM

## 2011-11-02 DIAGNOSIS — K589 Irritable bowel syndrome without diarrhea: Secondary | ICD-10-CM | POA: Diagnosis present

## 2011-11-02 DIAGNOSIS — Z7982 Long term (current) use of aspirin: Secondary | ICD-10-CM

## 2011-11-02 DIAGNOSIS — Z9861 Coronary angioplasty status: Secondary | ICD-10-CM

## 2011-11-02 DIAGNOSIS — F411 Generalized anxiety disorder: Secondary | ICD-10-CM | POA: Diagnosis present

## 2011-11-02 DIAGNOSIS — D500 Iron deficiency anemia secondary to blood loss (chronic): Principal | ICD-10-CM | POA: Diagnosis present

## 2011-11-02 DIAGNOSIS — Z9079 Acquired absence of other genital organ(s): Secondary | ICD-10-CM

## 2011-11-02 LAB — BASIC METABOLIC PANEL
BUN: 20 mg/dL (ref 6–23)
Chloride: 88 mEq/L — ABNORMAL LOW (ref 96–112)
GFR calc Af Amer: 43 mL/min — ABNORMAL LOW (ref >90)
GFR calc non Af Amer: 37 mL/min — ABNORMAL LOW (ref >90)
Potassium: 4.2 mEq/L (ref 3.5–5.1)
Sodium: 126 mEq/L — ABNORMAL LOW (ref 135–145)

## 2011-11-02 MED ORDER — TERAZOSIN HCL 1 MG PO CAPS
1.0000 mg | ORAL_CAPSULE | Freq: Every day | ORAL | Status: DC
  Administered 2011-11-02 – 2011-11-04 (×3): 1 mg via ORAL
  Filled 2011-11-02 (×3): qty 1

## 2011-11-02 MED ORDER — NITROGLYCERIN 0.4 MG SL SUBL: 0.4000 mg | SUBLINGUAL_TABLET | SUBLINGUAL | Status: DC | PRN

## 2011-11-02 MED ORDER — EZETIMIBE 10 MG PO TABS
10.0000 mg | ORAL_TABLET | Freq: Every day | ORAL | Status: DC
  Administered 2011-11-02 – 2011-11-04 (×3): 10 mg via ORAL
  Filled 2011-11-02 (×3): qty 1

## 2011-11-02 MED ORDER — RAMIPRIL 10 MG PO CAPS
10.0000 mg | ORAL_CAPSULE | Freq: Every day | ORAL | Status: DC
Start: 2011-11-02 — End: 2011-11-04
  Administered 2011-11-02 – 2011-11-04 (×3): 10 mg via ORAL
  Filled 2011-11-02 (×3): qty 1

## 2011-11-02 MED ORDER — FUROSEMIDE 10 MG/ML IJ SOLN
20.0000 mg | Freq: Once | INTRAMUSCULAR | Status: AC
  Administered 2011-11-03: 20 mg via INTRAVENOUS
  Filled 2011-11-02: qty 2

## 2011-11-02 MED ORDER — FUROSEMIDE 40 MG PO TABS
40.0000 mg | ORAL_TABLET | Freq: Every day | ORAL | Status: DC
  Administered 2011-11-03 – 2011-11-04 (×2): 40 mg via ORAL
  Filled 2011-11-02 (×2): qty 1

## 2011-11-02 MED ORDER — SODIUM CHLORIDE 0.9 % IV SOLN
INTRAVENOUS | Status: DC
  Administered 2011-11-02: 17:00:00 via INTRAVENOUS

## 2011-11-02 MED ORDER — AMLODIPINE BESYLATE 10 MG PO TABS
10.0000 mg | ORAL_TABLET | Freq: Every day | ORAL | Status: DC
  Administered 2011-11-02 – 2011-11-04 (×3): 10 mg via ORAL
  Filled 2011-11-02 (×3): qty 1

## 2011-11-02 MED ORDER — ONDANSETRON HCL 4 MG PO TABS: 4.0000 mg | ORAL_TABLET | Freq: Four times a day (QID) | ORAL | Status: DC | PRN

## 2011-11-02 MED ORDER — PANTOPRAZOLE SODIUM 40 MG PO TBEC
40.0000 mg | DELAYED_RELEASE_TABLET | Freq: Every day | ORAL | Status: DC
  Administered 2011-11-02 – 2011-11-04 (×3): 40 mg via ORAL
  Filled 2011-11-02 (×3): qty 1

## 2011-11-02 MED ORDER — SERTRALINE HCL 50 MG PO TABS
50.0000 mg | ORAL_TABLET | Freq: Every day | ORAL | Status: DC
  Administered 2011-11-03 – 2011-11-04 (×2): 50 mg via ORAL
  Filled 2011-11-02 (×2): qty 1

## 2011-11-02 MED ORDER — METOPROLOL SUCCINATE ER 100 MG PO TB24
100.0000 mg | ORAL_TABLET | Freq: Every day | ORAL | Status: DC
  Administered 2011-11-02 – 2011-11-04 (×3): 100 mg via ORAL
  Filled 2011-11-02 (×3): qty 1

## 2011-11-02 MED ORDER — LORAZEPAM 0.5 MG PO TABS
0.5000 mg | ORAL_TABLET | Freq: Every day | ORAL | Status: DC
Start: 2011-11-02 — End: 2011-11-04
  Administered 2011-11-02 – 2011-11-03 (×2): 0.5 mg via ORAL
  Filled 2011-11-02 (×2): qty 1

## 2011-11-02 MED ORDER — GUAIFENESIN 100 MG/5ML PO SOLN: 10.0000 mL | ORAL | Status: DC | PRN

## 2011-11-02 MED ORDER — ONDANSETRON HCL 4 MG/2ML IJ SOLN: 4.0000 mg | Freq: Four times a day (QID) | INTRAMUSCULAR | Status: DC | PRN

## 2011-11-02 MED ORDER — HYDROCODONE-ACETAMINOPHEN 5-325 MG PO TABS
1.0000 | ORAL_TABLET | Freq: Two times a day (BID) | ORAL | Status: DC
  Administered 2011-11-02 – 2011-11-04 (×4): 1 via ORAL
  Filled 2011-11-02 (×4): qty 1

## 2011-11-02 MED ORDER — RAMIPRIL 10 MG PO TABS: 10.0000 mg | ORAL_TABLET | Freq: Every day | ORAL | Status: DC

## 2011-11-02 NOTE — H&P (Signed)
Darlene [name] is an 78 y.o. female.   Chief Complaint: Active lower GI bleed with drop in Hgb HPI: Darlene [name] was recently admitted to [facility] hospital with angina induced by anemia with Hgb 5.4 g. She was transfused 3 units of blood that admission. She had GI work -up with colonoscopy that revealed no active bleeding site but the presence of diverticulosis. She stabilized with stable Hgb for 36 hrs and was discharged to home. Since being home she has had continued intermittent hematochezia and her Hgb has dropped from 9.6 to 7.0 g on July 23rd. She is now admitted for transfusion and further evaluation.  Past Medical History  Diagnosis Date  . UTI (urinary tract infection)   . IBS (irritable bowel syndrome)   . Abdominal pain, left lower quadrant   . CAD (coronary artery disease)   . Pneumonia   . HTN (hypertension)   . Hyperlipidemia   . Diverticulosis of colon   . History of colon cancer   . Allergic rhinitis   . Angina   . Cancer   . Arthritis   . Shortness of breath   . Anemia   . Blood transfusion   . Anxiety     Past Surgical History  Procedure Date  . Hemicolectomy 1991    Right for mgt of colon cancer.  . Angioplasty 1989    Stent x 2  . Appendectomy   . Bilateral salpingoophorectomy   . Breast surgery     benign  . Orif hip fracture 12/2009    Dr Darlene [name]  . Esophagogastroduodenoscopy 08/09/2011    Procedure: ESOPHAGOGASTRODUODENOSCOPY (EGD);  Surgeon: Darlene Carwin, MD;  Location: [facility] ENDOSCOPY;  Service: Endoscopy;  Laterality: N/A;  . Colonoscopy 10/24/2011    Procedure: COLONOSCOPY;  Surgeon: Darlene Fiedler, MD;  Location: [facility] ENDOSCOPY;  Service: Gastroenterology;  Laterality: N/A;    Family History  Problem Relation Age of Onset  . Colon cancer Neg Hx   . Breast cancer Neg Hx       Social History:  Widowed, Son lives w/her and has multiple medical problems. She has family in [place] - a nephew was recently murdered.  Daily Caffeine - Cokes  END OF LIFE  CARE: Patient hesitant to decide "I will decide at the time." thus she is a full code. She does not want to be a vegetable.  Allergies:  Allergies  Allergen Reactions  . Antihistamines, Chlorpheniramine-Type     All Antihistamines.    No current facility-administered medications on file prior to encounter.   Current Outpatient Prescriptions on File Prior to Encounter  Medication Sig Dispense Refill  . amLODipine (NORVASC) 10 MG tablet Take 10 mg by mouth daily.        Marland Kitchen aspirin 81 MG chewable tablet Chew 81 mg by mouth daily.      Marland Kitchen ezetimibe (ZETIA) 10 MG tablet Take 10 mg by mouth daily.      . metoprolol (TOPROL-XL) 100 MG 24 hr tablet Take 100 mg by mouth daily.        . Multiple Vitamins-Minerals (MULTIVITAMIN,TX-MINERALS) tablet Take 1 tablet by mouth daily.        . pravastatin (PRAVACHOL) 80 MG tablet Take 80 mg by mouth daily.        . ramipril (ALTACE) 10 MG tablet Take 10 mg by mouth daily.        Marland Kitchen terazosin (HYTRIN) 1 MG capsule Take 1 mg by mouth daily.        Marland Kitchen  nitroGLYCERIN (NITROSTAT) 0.4 MG SL tablet Place 0.4 mg under the tongue every 5 (five) minutes as needed. For chest pain           Review of Systems  Constitutional: Positive for malaise/fatigue. Negative for fever, chills and weight loss.  HENT: Negative.   Eyes: Negative.   Respiratory: Negative.   Cardiovascular: Negative.   Gastrointestinal: Positive for blood in stool. Negative for nausea, vomiting and abdominal pain.  Genitourinary: Negative.   Musculoskeletal: Negative.   Skin: Negative.   Neurological: Negative.   Endo/Heme/Allergies: Negative.   Psychiatric/Behavioral: Negative.     There were no vitals taken for this visit. Physical Exam  Filed Vitals:   11/02/11 1656  BP: 146/67  Pulse:   Temp:   Resp:    Gen'l - elderly white woman sitting in a chair in no distress HEENT- lesion on lower eyelid left, C&S clear, PERRLA Neck- supple Nodes - negative Cor 2+ radial pulse, RRR Pulm  - normal respirations, CTAP Abd- BS+ , soft, no HSM, no guarding or rebound Ext - no deformity Neuro - HOH, otherwise a normal exam.   Outpatient lab: Hgb 11/01/11 = 7.0  Assessment/Plan 1. Lower GI bleed - patient with recent hospitalization for GI bleed. No source found with eval including colonoscopy. She has had recurrent hematochezia for the past several days with a drop in Hgb to 7.0 She had CAD and has had a NSTEMI several months ago related to a bleed and at last admission had marked angina when Hgb got to 5.4.  Plan T&C and transfuse 2 units PRBCs  Reconsult GI  2. Cardiac - currently stable - no chest pain. At last admission her cardiologist Dr. Jacinto [name] did not feel she needed any further evaluation.  Plan - continue home medications  3. HTN- she has been stable  Plan- continue home medications.  Darlene [name] 11/02/2011, 2:59 PM

## 2011-11-03 ENCOUNTER — Telehealth: Payer: Self-pay

## 2011-11-03 ENCOUNTER — Ambulatory Visit: Payer: Medicare Other | Admitting: Internal Medicine

## 2011-11-03 MED ORDER — LORAZEPAM 0.5 MG PO TABS
0.5000 mg | ORAL_TABLET | ORAL | Status: DC | PRN
  Administered 2011-11-03: 0.5 mg via ORAL
  Filled 2011-11-03: qty 1

## 2011-11-03 MED ORDER — LORAZEPAM 2 MG/ML IJ SOLN: 0.5000 mg | INTRAMUSCULAR | Status: DC | PRN

## 2011-11-03 MED ORDER — PRAMIPEXOLE DIHYDROCHLORIDE 0.25 MG PO TABS
0.2500 mg | ORAL_TABLET | Freq: Once | ORAL | Status: AC
  Administered 2011-11-03: 0.25 mg via ORAL
  Filled 2011-11-03: qty 1

## 2011-11-03 NOTE — Progress Notes (Signed)
First unit of blood hung. There was a delay in obtaining type and cross. Lab not drawn til close to 2300.

## 2011-11-03 NOTE — Progress Notes (Signed)
Subjective: Sitting up ni a chair. She denies any chest pain. She has not had a BM today. Had BM yesterday - no blood.  Objective: Lab: Lab Results  Component Value Date   WBC 7.4 10/19/2011   HGB 10.7* 11/03/2011   HCT 30.8* 11/03/2011   MCV 86.4 10/19/2011   PLT 126* 10/19/2011   BMET    Component Value Date/Time   NA 126* 11/02/2011 1657   K 4.2 11/02/2011 1657   CL 88* 11/02/2011 1657   CO2 26 11/02/2011 1657   GLUCOSE 114* 11/02/2011 1657   BUN 20 11/02/2011 1657   CREATININE 1.25* 11/02/2011 1657   CALCIUM 9.0 11/02/2011 1657   GFRNONAA 37* 11/02/2011 1657   GFRAA 43* 11/02/2011 1657     Imaging:  Scheduled Meds:   . amLODipine  10 mg Oral Daily  . ezetimibe  10 mg Oral Daily  . furosemide  20 mg Intravenous Once  . furosemide  40 mg Oral Daily  . HYDROcodone-acetaminophen  1 tablet Oral BID  . LORazepam  0.5 mg Oral QHS  . metoprolol succinate  100 mg Oral Daily  . pantoprazole  40 mg Oral Q1200  . pramipexole  0.25 mg Oral Once  . ramipril  10 mg Oral Daily  . sertraline  50 mg Oral Daily  . terazosin  1 mg Oral Daily  . DISCONTD: ramipril  10 mg Oral Daily   Continuous Infusions:   . sodium chloride 50 mL/hr at 11/03/11 0701   PRN Meds:.guaiFENesin, LORazepam, LORazepam, nitroGLYCERIN, ondansetron (ZOFRAN) IV, ondansetron   Physical Exam: Filed Vitals:   11/03/11 0702  BP: 143/65  Pulse: 60  Temp: 98.3 F (36.8 C)  Resp: 18    Intake/Output Summary (Last 24 hours) at 11/03/11 1242 Last data filed at 11/03/11 0730  Gross per 24 hour  Intake   2196 ml  Output   2625 ml  Net   -429 ml   Gen'l- elderly white woman in no distress Chest - Good breathsounds Cor- 2+ radial RRR Abd- soft, BS+, no guarding       Assessment/Plan: 1. GI- better than expect response to transfusion of PRBCs - Hgb 10.7 Plan - H/H in AM   Dispo - if no recurrent hematochezia, if Hgb remains stable then home in AM   Donold Marotto 11/03/2011, 12:39 PM

## 2011-11-03 NOTE — Telephone Encounter (Signed)
PCC notfied to contact [facility] to pick up oxygen

## 2011-11-03 NOTE — Progress Notes (Signed)
Patient c/o restless legs and "can't go to sleep" even after vicodin and ativan. Spoke to Dr Kevan Ny. New orders received for one time order for merapex and prn ativan.

## 2011-11-03 NOTE — Clinical Documentation Improvement (Signed)
MALNUTRITION DOCUMENTATION CLARIFICATION  THIS DOCUMENT IS NOT A PERMANENT PART OF THE MEDICAL RECORD  TO RESPOND TO THE THIS QUERY, FOLLOW THE INSTRUCTIONS BELOW:  1. If needed, update documentation for the patient's encounter via the notes activity.  2. Access this query again and click edit on the In [employer].  3. After updating, or not, click F2 to complete all highlighted (required) fields concerning your review. Select "additional documentation in the medical record" OR "no additional documentation provided".  4. Click Sign note button.  5. The deficiency will fall out of your In Basket *Please let us know if you are not able to complete this workflow by phone or e-mail (listed below).  Please update your documentation within the medical record to reflect your response to this query.                                                                                        11/03/11   Dear Dr.NORINS, [facility],  In a better effort to capture your patient's severity of illness, reflect appropriate length of stay and utilization of resources, a review of the patient medical record has revealed the following indicators.    Based on your clinical judgment, please clarify and document in a progress note and/or discharge summary the clinical condition associated with the following supporting information:  In responding to this query please exercise your independent judgment.  The fact that a query is asked, does not imply that any particular answer is desired or expected.  Pt admitted with GIB/diverticulosis,  Pt's BMI= 18.95 in setting of GIB and H/O Cancer   Please clarify whether or not BMI can be linked to one of he diagnoses listed below and document in pn  and d/c. Thank You!  [facility]: When linking BMI to a diagnosis please document both BMI and diagnosis together in pn for accuracy of SOI and ROM.   Possible Clinical Conditions?   _______Moderate  Malnutrition _______Severe Malnutrition   _______Protein Calorie Malnutrition _______Severe Protein Calorie Malnutrition   _______Other Condition________________ _______Cannot clinically determine     Supporting Information: Risk Factors:  Signs & Symptoms: Ht 5'5"     Wt 113lb  BMI: 18.95   Diagnostics: Component     Latest Ref Rng 11/02/2011         4:57 PM  Sodium     135 - 145 mEq/L 126 (L)    Treatment  monitoring    You may use possible, probable, or suspect with inpatient documentation. possible, probable, suspected diagnoses MUST be documented at the time of discharge  Reviewed:  no additional documentation provided ljh  Thank You,  Enis Slipper  RN, BSN, MSN/Inf, CCDS Clinical Documentation Specialist [facility] Dept Pager: [phone] / E-mail: [email or web address]  Health Information Management [place]

## 2011-11-03 NOTE — Telephone Encounter (Signed)
Pt is currently admitted at [facility]. Pt is requesting MD fax an order O2 [facility] to discontinue O2 and pick up O2 tanks from her home.

## 2011-11-04 ENCOUNTER — Other Ambulatory Visit: Payer: Self-pay | Admitting: Internal Medicine

## 2011-11-04 DIAGNOSIS — D649 Anemia, unspecified: Secondary | ICD-10-CM

## 2011-11-04 LAB — TYPE AND SCREEN
Antibody Screen: NEGATIVE
Unit division: 0

## 2011-11-04 LAB — HEMOGLOBIN AND HEMATOCRIT, BLOOD
HCT: 29.6 % — ABNORMAL LOW (ref 36.0–46.0)
Hemoglobin: 9.3 g/dL — ABNORMAL LOW (ref 12.0–15.0)

## 2011-11-04 NOTE — Care Management Note (Signed)
    Page 1 of 1   11/04/2011     2:26:25 PM   CARE MANAGEMENT NOTE 11/04/2011  Patient:  CUMA, POLYAKOV   Account Number:  [id number]  Date Initiated:  11/04/2011  Documentation initiated by:  CRAFT,TERRI  Subjective/Objective Assessment:   78 yo female admitted 11/02/11 with loower GI bleed     Action/Plan:   D/C when medically stable   Anticipated DC Date:  11/07/2011   Anticipated DC Plan:  HOME W HOME HEALTH SERVICES      DC Planning Services  CM consult      [facility] Choice  HOME HEALTH   Choice offered to / List presented to:  C-1 Patient        HH arranged  HH-1 RN  HH-2 PT      [facility] agency  [facility]   Status of service:  Completed, signed off  Discharge Disposition:  HOME W HOME HEALTH SERVICES  Per UR Regulation:  Reviewed for med. necessity/level of care/duration of stay  Comments:  11/04/11, Kathi Der RNC-MNN, BSN, [phone], CM received referral.  CM met with pt.  Pt states she is active with [facility] and wants  to continue with their services.  Darl Pikes at [facility] contacted with orders and confirmation received.

## 2011-11-04 NOTE — Progress Notes (Signed)
CSW was contacted by RN to assist with a cab voucher for the pt. CSW verified with the RN that the pt was ambulatory and able to safely transport in a cab vs transporting by [facility]. CSW met with pt and pt adamantly stated she did not want to ride in a cab and states that she "walks fine with a cane". CSW contacted pt's son, Renette Hsu, who states the pt is capable in riding in a cab and there is no alternate way of transportation. Ethelene Browns states that he is currently at the pt's home and will assist the pt inside once she arrives. Cab voucher was approved by CSW Director, [employer]. No further needs identified at this time.

## 2011-11-04 NOTE — Progress Notes (Signed)
S: patient has no complaints. She has not had a BM since yesterday and denies any bleeding O: Vitals are stable      Vitals - stable  Lab Hgb 9.3, down from 10.7  Plan - repeat Hgb this PM - if stable and if no hematochezia during the day may go home this PM or early AM Sat.

## 2011-11-04 NOTE — Plan of Care (Signed)
Problem: Phase II Progression Outcomes Goal: Progress activity as tolerated unless otherwise ordered Outcome: Completed/Met Date Met:  11/04/11 Ambulated in hallway with staff on 7/25

## 2011-11-04 NOTE — Progress Notes (Signed)
Pt D/C home with home health. Pt is stable on room air alert and oriented, has no new complains. Vital are within normal limit for pts values. D/C instructions and medication administration instructions done. Pt verbalizes understanding.

## 2011-11-04 NOTE — Progress Notes (Signed)
PM Hgb 10.3 - closer to post transfusion value. She has had no visible bleeding.  Plan : d/c home  F/u H/H Tuesday 11/08/11  Dictation # [id number]

## 2011-11-05 NOTE — Discharge Summary (Signed)
Darlene [name], Darlene [name]             ACCOUNT NO.:  [id number]  MEDICAL RECORD NO.:  [id number]  LOCATION:  1313                         FACILITY:  [facility]  PHYSICIAN:  Rosalyn Gess. Lorane Cousar, MD  DATE OF BIRTH:  29-Aug-1920  DATE OF ADMISSION:  11/02/2011 DATE OF DISCHARGE:  11/04/2011                              DISCHARGE SUMMARY   ADMITTING DIAGNOSES: 1. Hematochezia with anemia. 2. Coronary artery disease - stable. 3. Hypertension.  DISCHARGE DIAGNOSES: 1. Hematochezia with anemia. 2. Coronary artery disease - stable. 3. Hypertension.  CONSULTANTS:  None.  PROCEDURES:  Transfusion.  HISTORY OF PRESENT ILLNESS:  Darlene [name] is an 78 year old woman who has had 2 previous hospitalizations since Darlene first of Darlene year.  Darlene 2 prior admissions, Darlene [name] had significant anemia and chest pain. With Darlene first admission, she had a non ST elevation MI.  With Darlene second admission, she had angina with no bump in cardiac enzymes.  She has had a thorough evaluation including colonoscopy which revealed no active lesions, but she did have diverticulosis as well as internal hemorrhoids.  After her last admission, discharge October 25, 2011, she had done relatively well at home.  She was receiving physical therapy and home health nursing.  She was noted to have several bloody stool. Followup hemoglobin was obtained as an outpatient which came back at 7.0 g down from 9.2 at her last discharge.  With Darlene [name]'s history of anemia related angina and her known coronary artery disease, she was admitted to [facility] hospital for transfusion.  Having had a full workup, diagnostic studies and further evaluation were not anticipated.  Please see Darlene H and P as well as previous epic records for past medical history, family history, and social history.  HOSPITAL COURSE:  Darlene [name] was admitted to a non telemetry floor. She was typed and crossmatched and transfused 2 units of blood.  She had a good response  with a rise in hemoglobin to 10.7 g.  On Darlene morning of Darlene day of discharge, her hemoglobin had dropped to 9.3 g, so her discharge was delayed.  Repeat hemoglobin at 1443 hours revealed a hemoglobin of 10.3 g.  Darlene [name] did have a bowel movement during Darlene day and did not had any hematochezia.  With Darlene [name] being hemodynamically stable with no signs of active bleeding during her stay in [facility] hospital with a stable hemoglobin after transfusion with her feeling well, she is now ready for discharge to home.  Cause of her bleeding is very likely to be combination of possible diverticular bleed along with internal hemorrhoids.  DISCHARGE PHYSICAL EXAMINATION:  VITAL SIGNS:  Temperature was 98.4, blood pressure 106/59, pulse was 71, respirations were 18, O2 sats 95% on room air. GENERAL APPEARANCE:  This is an elderly woman sitting in a chair who is in no acute distress. HEENT:  Normocephalic, atraumatic.  Darlene [name] has a large lesion on her left upper eyelid which has been present for some time which she has refused to have treated. NECK:  Supple. CHEST:  [name] is moving air well with no rales, wheezes, or rhonchi. CARDIOVASCULAR:  2+ radial pulse.  Her precordium is quiet.  She had a  regular rate and rhythm. ABDOMEN:  Nontender. NEUROLOGIC:  Revealed Darlene [name] to be awake, alert, oriented to person, place, time, and context.  She is very hard of hearing.  She moves all extremities to command.  FINAL LABORATORY DATA:  Hemoglobin 10.3 g with hematocrit 29.6% at Darlene time of discharge.  Darlene [name] had chemistries at admission with a sodium of 126 which is chronic, potassium 4.2, chloride was 88, CO2 of 26, BUN of 20, creatinine 1.25, glucose was 114.  DISPOSITION:  Darlene [name] is discharged to home.  We will resume her home health care and face-to-face encounter form is completed.  Darlene [name] will have followup hemoglobin in 7 days.  Due to Darlene difficulty in  transportation, we will not schedule her for office visit at this time.  Darlene [name]'s condition at Darlene time of discharge dictation is stable, but guarded given her advanced age and multiple comorbidities.     Rosalyn Gess Bianka Liberati, MD     MEN/MEDQ  D:  11/04/2011  T:  11/05/2011  Job:  [id number]

## 2011-11-07 ENCOUNTER — Telehealth: Payer: Self-pay | Admitting: *Deleted

## 2011-11-07 MED ORDER — ROPINIROLE HCL 0.5 MG PO TABS: 0.5000 mg | ORAL_TABLET | Freq: Every day | ORAL | Status: DC

## 2011-11-07 NOTE — Telephone Encounter (Signed)
[facility] - home O2 is cancelled.  Requip 0.5 mg po qHS for restless leg.

## 2011-11-07 NOTE — Telephone Encounter (Signed)
Order written to be faxed to [facility] to D/C patient home O2. Medication sent to [facility]

## 2011-11-07 NOTE — Telephone Encounter (Signed)
PATIENT CALLED CONCERN NO ONE HAS PICKED UP THIS 02  EQUIPMENT YET AND SHE IS GETTING BILLED FOR IT . ALSO REQUEST Rx FOR THE LEG SYNDROME PROBLEM SHE HAS .

## 2011-11-09 ENCOUNTER — Telehealth: Payer: Self-pay

## 2011-11-09 NOTE — Telephone Encounter (Signed)
HHRN called to inform MD that agency has resumed her care. Pt had labs done and the results were faxed to MD's attention yesterday afternoon. RN states that pt has also adamantly refused PT and Social Work services although she is a fall risk.  Pt has been complaining urinary frequency with burning or pain. Please advise if MD would like at [facility]A.

## 2011-11-09 NOTE — Telephone Encounter (Signed)
Left message on Tresa Endo, nurse [facility] verbal order Dr. Debby Bud for U/A to be done

## 2011-11-09 NOTE — Telephone Encounter (Signed)
OK for u/a  

## 2011-11-10 ENCOUNTER — Telehealth: Payer: Self-pay

## 2011-11-10 NOTE — Telephone Encounter (Signed)
Labs received: Hgb 10. No additional labs

## 2011-11-10 NOTE — Telephone Encounter (Signed)
HHRN called to verify if MD received Hemoglobin and Hemocrit done 07/30 and if further labs were necessary. HHRN will be visiting with pt tomorrow and would be able to draw labs at that time of needed.

## 2011-11-11 NOTE — Telephone Encounter (Signed)
HHRN Kelly notified of no additional lab work.

## 2011-11-14 ENCOUNTER — Other Ambulatory Visit: Payer: Self-pay | Admitting: *Deleted

## 2011-11-14 MED ORDER — CIPROFLOXACIN HCL 250 MG PO TABS: 250.0000 mg | ORAL_TABLET | Freq: Every day | ORAL | Status: DC

## 2011-11-14 MED ORDER — NITROFURANTOIN MONOHYD MACRO 100 MG PO CAPS: 100.0000 mg | ORAL_CAPSULE | Freq: Every day | ORAL | Status: DC

## 2011-11-14 NOTE — Telephone Encounter (Signed)
PHARMACY L[facility] REQUEST TO CHANGE ANTIBIOTIC DUE TO PATIENT TAKING ZOLOFT WOULD CAUSE SEVERE INTERACTION. MED CHANGED PER DR. PLOTNIKOV TO MACROBID.

## 2011-11-14 NOTE — Telephone Encounter (Signed)
DR. Posey Rea  AWARE OF LAB RESULTS OF UA AND CULTURE REFPORT. SYMPTOMS PER KELLY URINARY FREQUENCY AND BURNING AND HISTORY OF UTI. MEDS ORDERED AND SENT TO LANE DRU[facility]. [facility] NURSE KELLY AWARE OF ORDER AND WILL NOTIFIY PATIENT SON TO PICK UP MEDICATION TODAY.

## 2011-11-16 DIAGNOSIS — I509 Heart failure, unspecified: Secondary | ICD-10-CM

## 2011-11-16 DIAGNOSIS — F411 Generalized anxiety disorder: Secondary | ICD-10-CM

## 2011-11-16 DIAGNOSIS — D62 Acute posthemorrhagic anemia: Secondary | ICD-10-CM

## 2011-11-16 DIAGNOSIS — K922 Gastrointestinal hemorrhage, unspecified: Secondary | ICD-10-CM

## 2011-11-21 ENCOUNTER — Encounter: Payer: Self-pay | Admitting: Internal Medicine

## 2011-11-23 ENCOUNTER — Other Ambulatory Visit (INDEPENDENT_AMBULATORY_CARE_PROVIDER_SITE_OTHER): Payer: Medicare Other

## 2011-11-23 ENCOUNTER — Ambulatory Visit (INDEPENDENT_AMBULATORY_CARE_PROVIDER_SITE_OTHER): Payer: Medicare Other | Admitting: Internal Medicine

## 2011-11-23 ENCOUNTER — Encounter: Payer: Self-pay | Admitting: Internal Medicine

## 2011-11-23 VITALS — BP 132/60 | HR 84 | Temp 98.3°F | Resp 16 | Wt 115.0 lb

## 2011-11-23 DIAGNOSIS — K922 Gastrointestinal hemorrhage, unspecified: Secondary | ICD-10-CM

## 2011-11-23 DIAGNOSIS — D649 Anemia, unspecified: Secondary | ICD-10-CM

## 2011-11-23 DIAGNOSIS — I2510 Atherosclerotic heart disease of native coronary artery without angina pectoris: Secondary | ICD-10-CM

## 2011-11-23 DIAGNOSIS — I10 Essential (primary) hypertension: Secondary | ICD-10-CM

## 2011-11-23 DIAGNOSIS — C44111 Basal cell carcinoma of skin of unspecified eyelid, including canthus: Secondary | ICD-10-CM

## 2011-11-23 LAB — CBC WITH DIFFERENTIAL/PLATELET
Basophils Relative: 0.2 % (ref 0.0–3.0)
Eosinophils Absolute: 0 10*3/uL (ref 0.0–0.7)
Eosinophils Relative: 0.2 % (ref 0.0–5.0)
HCT: 32.3 % — ABNORMAL LOW (ref 36.0–46.0)
Lymphs Abs: 1.4 10*3/uL (ref 0.7–4.0)
MCHC: 33.6 g/dL (ref 30.0–36.0)
MCV: 92 fl (ref 78.0–100.0)
Monocytes Absolute: 0.5 10*3/uL (ref 0.1–1.0)
Neutrophils Relative %: 73.9 % (ref 43.0–77.0)
Platelets: 189 10*3/uL (ref 150.0–400.0)
WBC: 7.1 10*3/uL (ref 4.5–10.5)

## 2011-11-23 NOTE — Progress Notes (Signed)
Subjective:    Patient ID: Darlene [name], female    DOB: 11-10-1920, 78 y.o.   MRN: [id number]  HPI Darlene [name] presents for hospital follow-up - recent admission for anemia and need for transfusion. Reviewed two previous hospitalizations: profound anemia with the earlier admission including anemia induced angina with NSTEMI. She has had full evaluation including colonoscopy. Source of blood loss remains elusive. Since hospital d/c she has been doing well. She reports seeing a small amount of blood x 1`. She has had no chest pain.  There is a growth on the left lower eyelid that is getting larger. She has seen Dr. Randon Goldsmith for this in the past but it has been a while.  Past Medical History  Diagnosis Date  . UTI (urinary tract infection)   . IBS (irritable bowel syndrome)   . Abdominal pain, left lower quadrant   . CAD (coronary artery disease)   . Pneumonia   . HTN (hypertension)   . Hyperlipidemia   . Diverticulosis of colon   . History of colon cancer   . Allergic rhinitis   . Angina   . Cancer   . Arthritis   . Shortness of breath   . Anemia   . Blood transfusion   . Anxiety    Past Surgical History  Procedure Date  . Hemicolectomy 1991    Right for mgt of colon cancer.  . Angioplasty 1989    Stent x 2  . Appendectomy   . Bilateral salpingoophorectomy   . Breast surgery     benign  . Orif hip fracture 12/2009    Dr Charlann Boxer  . Esophagogastroduodenoscopy 08/09/2011    Procedure: ESOPHAGOGASTRODUODENOSCOPY (EGD);  Surgeon: Hart Carwin, MD;  Location: [facility] ENDOSCOPY;  Service: Endoscopy;  Laterality: N/A;  . Colonoscopy 10/24/2011    Procedure: COLONOSCOPY;  Surgeon: Beverley Fiedler, MD;  Location: [facility] ENDOSCOPY;  Service: Gastroenterology;  Laterality: N/A;   Family History  Problem Relation Age of Onset  . Colon cancer Neg Hx   . Breast cancer Neg Hx    History   Social History  . Marital Status: Legally Separated    Spouse Name: N/A    Number of Children: N/A  .  Years of Education: N/A   Occupational History  . Retired    Social History Main Topics  . Smoking status: Never Smoker   . Smokeless tobacco: Not on file  . Alcohol Use: No  . Drug Use: No  . Sexually Active: Not on file   Other Topics Concern  . Not on file   Social History Narrative   PHYSICIAN ROSTERCardiology - Dr Lynnell Chad - Dr Meyer Russel - Dr Manfred Arch, Son lives w/her and has multiple medical problems. She has family in [place] - a nephew was recently murdered.[employer] - CokesEND OF LIFE CARE: Patient hesitant to decide "I will decide at the time." thus she is a full code. She does not want to be a vegetable.     Current Outpatient Prescriptions on File Prior to Visit  Medication Sig Dispense Refill  . amLODipine (NORVASC) 10 MG tablet Take 10 mg by mouth daily.        Marland Kitchen aspirin 81 MG chewable tablet Chew 81 mg by mouth daily.      Marland Kitchen esomeprazole (NEXIUM) 40 MG capsule Take 40 mg by mouth 2 (two) times daily.      Marland Kitchen ezetimibe (ZETIA) 10 MG tablet Take 10 mg by mouth daily.      Marland Kitchen  furosemide (LASIX) 40 MG tablet Take 40 mg by mouth daily.      Marland Kitchen guaiFENesin (ROBITUSSIN) 100 MG/5ML SOLN Take 10 mLs by mouth every 4 (four) hours as needed. Congestion.      Marland Kitchen HYDROcodone-acetaminophen (NORCO/VICODIN) 5-325 MG per tablet Take 1 tablet by mouth 2 (two) times daily. For pain. (Scheduled.)      . LORazepam (ATIVAN) 0.5 MG tablet Take 0.5 mg by mouth at bedtime.      . metoprolol (TOPROL-XL) 100 MG 24 hr tablet Take 100 mg by mouth daily.        . Multiple Vitamins-Minerals (MULTIVITAMIN,TX-MINERALS) tablet Take 1 tablet by mouth daily.        . nitrofurantoin, macrocrystal-monohydrate, (MACROBID) 100 MG capsule Take 1 capsule (100 mg total) by mouth daily. X 10 DAYS  10 capsule  0  . nitroGLYCERIN (NITROSTAT) 0.4 MG SL tablet Place 0.4 mg under the tongue every 5 (five) minutes as needed. For chest pain      . pravastatin (PRAVACHOL) 80 MG tablet Take 80 mg by mouth daily.         . psyllium (HYDROCIL/METAMUCIL) 95 % PACK Take 1 packet by mouth daily.      . ramipril (ALTACE) 10 MG tablet Take 10 mg by mouth daily.        Marland Kitchen rOPINIRole (REQUIP) 0.5 MG tablet Take 1 tablet (0.5 mg total) by mouth at bedtime.  30 tablet  6  . sertraline (ZOLOFT) 50 MG tablet Take 50 mg by mouth daily.      Marland Kitchen terazosin (HYTRIN) 1 MG capsule Take 1 mg by mouth daily.            Review of Systems System review is negative for any constitutional, cardiac, pulmonary, GI or neuro symptoms or complaints other than as described in the HPI.     Objective:   Physical Exam Filed Vitals:   11/23/11 0932  BP: 132/60  Pulse: 84  Temp: 98.3 F (36.8 C)  Resp: 16   HEENT- large growth medial aspect left lower eye lid. C&S clear Cor- RRR Pulm - good breath sounds, no increased WOB Abd - BS+ Neuro - A&O x 3, HOH.       Assessment & Plan:

## 2011-11-23 NOTE — Assessment & Plan Note (Signed)
Has not had recurrent LGI bleed.  Plan - follow-up CBC

## 2011-11-23 NOTE — Assessment & Plan Note (Signed)
Doing well. Has not had any recurrent chest pain. She is not a candidate for further intervention.  Plan - continue present medications  See Dr. Jacinto Halim as instructed.

## 2011-11-23 NOTE — Assessment & Plan Note (Signed)
BP Readings from Last 3 Encounters:  11/23/11 132/60  11/04/11 106/59  10/25/11 127/69   Ok control. No change in medications

## 2011-11-23 NOTE — Assessment & Plan Note (Signed)
Progressive lesion on the lower eyelid presumptively a basal cell.   Plan - she is encouraged to contact Dr. Randon Goldsmith'  Office for follow-up.

## 2011-11-25 ENCOUNTER — Telehealth: Payer: Self-pay | Admitting: *Deleted

## 2011-11-25 NOTE — Telephone Encounter (Signed)
Patient aware of lab results of hgb.

## 2011-11-25 NOTE — Telephone Encounter (Signed)
Message copied by Elnora Morrison on Fri Nov 25, 2011  1:25 PM ------      Message from: Jacques Navy      Created: Fri Nov 25, 2011 11:54 AM       Call patient - Hgb is stble at 10.9 g

## 2011-11-30 ENCOUNTER — Other Ambulatory Visit: Payer: Self-pay | Admitting: *Deleted

## 2011-11-30 NOTE — Telephone Encounter (Signed)
Ok to refill - apply bid to affect area of skin

## 2011-11-30 NOTE — Telephone Encounter (Signed)
Received fax for refill on patient tricamcinolone 0.1% cream. Not found on patient med list. . Please advise

## 2011-12-01 MED ORDER — TRIAMCINOLONE ACETONIDE 0.1 % EX CREA: TOPICAL_CREAM | Freq: Two times a day (BID) | CUTANEOUS | Status: DC

## 2011-12-06 ENCOUNTER — Other Ambulatory Visit: Payer: Self-pay | Admitting: *Deleted

## 2011-12-06 MED ORDER — HYDROCODONE-ACETAMINOPHEN 5-325 MG PO TABS: 1.0000 | ORAL_TABLET | Freq: Two times a day (BID) | ORAL | Status: DC

## 2011-12-06 NOTE — Telephone Encounter (Signed)
PRINTED REFILL FOR MEDICATION HYDROCODONE/APAP.

## 2011-12-08 ENCOUNTER — Other Ambulatory Visit: Payer: Self-pay | Admitting: *Deleted

## 2011-12-08 MED ORDER — LORAZEPAM 0.5 MG PO TABS: 0.5000 mg | ORAL_TABLET | Freq: Every day | ORAL | Status: DC

## 2011-12-08 NOTE — Telephone Encounter (Signed)
Pt requesting refill on her lorazepam. Is this ok?...Raechel Chute

## 2011-12-09 NOTE — Telephone Encounter (Signed)
Rx faxed to pharmacy  

## 2011-12-22 ENCOUNTER — Telehealth: Payer: Self-pay | Admitting: Internal Medicine

## 2011-12-22 MED ORDER — CEPHALEXIN 500 MG PO CAPS: 500.0000 mg | ORAL_CAPSULE | Freq: Four times a day (QID) | ORAL | Status: AC

## 2011-12-22 NOTE — Telephone Encounter (Signed)
1. Soak in warm water with povidone-iodine x 10 minutes, rinse and dry. 2. Keflex 500 mg qid x 7 days 3. Aleve 1or 2 tablets twice a day for pain 4. For fever, persistent redness, purulent drainage will need to be seen.

## 2011-12-22 NOTE — Telephone Encounter (Signed)
Pt and son informed of below.

## 2011-12-22 NOTE — Telephone Encounter (Signed)
Darlene [name] twisted a toe nail trying to get it off.  She thinks it is infected. It is red below the toe nail.  She is not sure how she would get here, but needs to know what to do.

## 2012-01-10 ENCOUNTER — Other Ambulatory Visit: Payer: Self-pay

## 2012-01-10 MED ORDER — HYDROCODONE-ACETAMINOPHEN 5-325 MG PO TABS: 1.0000 | ORAL_TABLET | Freq: Two times a day (BID) | ORAL | Status: DC

## 2012-01-10 NOTE — Telephone Encounter (Signed)
Pt called requesting a call back from [place], LPN.

## 2012-01-10 NOTE — Telephone Encounter (Signed)
Patient notified of Rx printed and to be signed by Dr. Debby Bud.

## 2012-01-25 ENCOUNTER — Other Ambulatory Visit: Payer: Self-pay

## 2012-01-25 MED ORDER — TRIAMCINOLONE ACETONIDE 0.1 % EX CREA: TOPICAL_CREAM | Freq: Two times a day (BID) | CUTANEOUS | Status: DC

## 2012-02-15 ENCOUNTER — Other Ambulatory Visit: Payer: Self-pay | Admitting: *Deleted

## 2012-02-15 NOTE — Telephone Encounter (Signed)
Patient request refill on Hydrocodone/APAP 5-325mg  one tablet 2 x day for pain. Please advise

## 2012-02-16 MED ORDER — HYDROCODONE-ACETAMINOPHEN 5-325 MG PO TABS: 1.0000 | ORAL_TABLET | Freq: Two times a day (BID) | ORAL | Status: DC

## 2012-02-16 NOTE — Telephone Encounter (Signed)
K w/ 2 refills

## 2012-02-16 NOTE — Telephone Encounter (Signed)
mediacation refill called in to [facility]. Patient notified.

## 2012-04-30 ENCOUNTER — Other Ambulatory Visit: Payer: Self-pay | Admitting: *Deleted

## 2012-04-30 MED ORDER — AMLODIPINE BESYLATE 10 MG PO TABS: 10.0000 mg | ORAL_TABLET | Freq: Every day | ORAL | Status: DC

## 2012-04-30 MED ORDER — EZETIMIBE 10 MG PO TABS: 10.0000 mg | ORAL_TABLET | Freq: Every day | ORAL | Status: DC

## 2012-05-07 ENCOUNTER — Other Ambulatory Visit: Payer: Self-pay | Admitting: *Deleted

## 2012-05-07 MED ORDER — HYDROCODONE-ACETAMINOPHEN 5-325 MG PO TABS: 1.0000 | ORAL_TABLET | Freq: Two times a day (BID) | ORAL | Status: DC

## 2012-05-11 ENCOUNTER — Other Ambulatory Visit: Payer: Self-pay | Admitting: *Deleted

## 2012-05-11 NOTE — Telephone Encounter (Signed)
Can you please do this as soon as possible, so they can deliver to pt? Thank you

## 2012-05-13 MED ORDER — HYDROCODONE-ACETAMINOPHEN 5-325 MG PO TABS: 1.0000 | ORAL_TABLET | Freq: Two times a day (BID) | ORAL | Status: DC

## 2012-05-14 ENCOUNTER — Other Ambulatory Visit: Payer: Self-pay | Admitting: *Deleted

## 2012-05-15 MED ORDER — HYDROCODONE-ACETAMINOPHEN 5-325 MG PO TABS: 1.0000 | ORAL_TABLET | Freq: Two times a day (BID) | ORAL | Status: DC

## 2012-05-23 ENCOUNTER — Encounter (HOSPITAL_COMMUNITY): Payer: Self-pay | Admitting: Cardiology

## 2012-05-23 ENCOUNTER — Observation Stay (HOSPITAL_COMMUNITY): Payer: Medicare Other

## 2012-05-23 ENCOUNTER — Inpatient Hospital Stay (HOSPITAL_COMMUNITY)
Admission: EM | Admit: 2012-05-23 | Discharge: 2012-05-26 | DRG: 811 | Disposition: A | Discharge status: Home / Self Care | Payer: Medicare Other | Attending: Internal Medicine | Admitting: Internal Medicine

## 2012-05-23 DIAGNOSIS — I248 Other forms of acute ischemic heart disease: Secondary | ICD-10-CM | POA: Diagnosis present

## 2012-05-23 DIAGNOSIS — K5090 Crohn's disease, unspecified, without complications: Secondary | ICD-10-CM | POA: Diagnosis present

## 2012-05-23 DIAGNOSIS — J9600 Acute respiratory failure, unspecified whether with hypoxia or hypercapnia: Secondary | ICD-10-CM | POA: Diagnosis present

## 2012-05-23 DIAGNOSIS — K648 Other hemorrhoids: Secondary | ICD-10-CM

## 2012-05-23 DIAGNOSIS — E785 Hyperlipidemia, unspecified: Secondary | ICD-10-CM | POA: Diagnosis present

## 2012-05-23 DIAGNOSIS — I2510 Atherosclerotic heart disease of native coronary artery without angina pectoris: Secondary | ICD-10-CM | POA: Diagnosis present

## 2012-05-23 DIAGNOSIS — D649 Anemia, unspecified: Principal | ICD-10-CM | POA: Diagnosis present

## 2012-05-23 DIAGNOSIS — K922 Gastrointestinal hemorrhage, unspecified: Secondary | ICD-10-CM

## 2012-05-23 DIAGNOSIS — K625 Hemorrhage of anus and rectum: Secondary | ICD-10-CM

## 2012-05-23 DIAGNOSIS — Z9861 Coronary angioplasty status: Secondary | ICD-10-CM

## 2012-05-23 DIAGNOSIS — I10 Essential (primary) hypertension: Secondary | ICD-10-CM | POA: Diagnosis present

## 2012-05-23 DIAGNOSIS — F411 Generalized anxiety disorder: Secondary | ICD-10-CM | POA: Diagnosis present

## 2012-05-23 DIAGNOSIS — I2489 Other forms of acute ischemic heart disease: Secondary | ICD-10-CM | POA: Diagnosis present

## 2012-05-23 DIAGNOSIS — Z85038 Personal history of other malignant neoplasm of large intestine: Secondary | ICD-10-CM

## 2012-05-23 DIAGNOSIS — R079 Chest pain, unspecified: Secondary | ICD-10-CM

## 2012-05-23 DIAGNOSIS — J811 Chronic pulmonary edema: Secondary | ICD-10-CM

## 2012-05-23 DIAGNOSIS — K921 Melena: Secondary | ICD-10-CM | POA: Diagnosis present

## 2012-05-23 DIAGNOSIS — K589 Irritable bowel syndrome without diarrhea: Secondary | ICD-10-CM | POA: Diagnosis present

## 2012-05-23 DIAGNOSIS — K5730 Diverticulosis of large intestine without perforation or abscess without bleeding: Secondary | ICD-10-CM | POA: Diagnosis present

## 2012-05-23 DIAGNOSIS — I059 Rheumatic mitral valve disease, unspecified: Secondary | ICD-10-CM | POA: Diagnosis present

## 2012-05-23 DIAGNOSIS — Z66 Do not resuscitate: Secondary | ICD-10-CM | POA: Diagnosis not present

## 2012-05-23 DIAGNOSIS — I214 Non-ST elevation (NSTEMI) myocardial infarction: Secondary | ICD-10-CM

## 2012-05-23 DIAGNOSIS — R0902 Hypoxemia: Secondary | ICD-10-CM | POA: Diagnosis present

## 2012-05-23 DIAGNOSIS — R0602 Shortness of breath: Secondary | ICD-10-CM

## 2012-05-23 DIAGNOSIS — K5010 Crohn's disease of large intestine without complications: Secondary | ICD-10-CM | POA: Diagnosis present

## 2012-05-23 LAB — COMPREHENSIVE METABOLIC PANEL
ALT: 11 U/L (ref 0–35)
AST: 19 U/L (ref 0–37)
Albumin: 3.7 g/dL (ref 3.5–5.2)
Alkaline Phosphatase: 73 U/L (ref 39–117)
BUN: 20 mg/dL (ref 6–23)
CO2: 28 mEq/L (ref 19–32)
Calcium: 9.4 mg/dL (ref 8.4–10.5)
Chloride: 99 mEq/L (ref 96–112)
Creatinine, Ser: 1.08 mg/dL (ref 0.50–1.10)
GFR calc Af Amer: 51 mL/min — ABNORMAL LOW (ref >90)
GFR calc non Af Amer: 44 mL/min — ABNORMAL LOW (ref >90)
Glucose, Bld: 123 mg/dL — ABNORMAL HIGH (ref 70–99)
Potassium: 4.4 mEq/L (ref 3.5–5.1)
Sodium: 137 mEq/L (ref 135–145)
Total Bilirubin: 0.3 mg/dL (ref 0.3–1.2)
Total Protein: 7 g/dL (ref 6.0–8.3)

## 2012-05-23 LAB — URINE MICROSCOPIC-ADD ON

## 2012-05-23 LAB — LIPASE, BLOOD: Lipase: 45 U/L (ref 11–59)

## 2012-05-23 LAB — CBC WITH DIFFERENTIAL/PLATELET
Basophils Absolute: 0 10*3/uL (ref 0.0–0.1)
Eosinophils Relative: 0 % (ref 0–5)
HCT: 25.9 % — ABNORMAL LOW (ref 36.0–46.0)
Hemoglobin: 8.3 g/dL — ABNORMAL LOW (ref 12.0–15.0)
Lymphocytes Relative: 12 % (ref 12–46)
Lymphs Abs: 0.8 10*3/uL (ref 0.7–4.0)
MCV: 87.5 fL (ref 78.0–100.0)
Monocytes Absolute: 0.4 10*3/uL (ref 0.1–1.0)
Neutro Abs: 5.2 10*3/uL (ref 1.7–7.7)
RBC: 2.96 MIL/uL — ABNORMAL LOW (ref 3.87–5.11)
WBC: 6.5 10*3/uL (ref 4.0–10.5)

## 2012-05-23 LAB — HEMOGLOBIN AND HEMATOCRIT, BLOOD
HCT: 27 % — ABNORMAL LOW (ref 36.0–46.0)
Hemoglobin: 8.5 g/dL — ABNORMAL LOW (ref 12.0–15.0)

## 2012-05-23 LAB — PRO B NATRIURETIC PEPTIDE: Pro B Natriuretic peptide (BNP): 1460 pg/mL — ABNORMAL HIGH (ref 0–450)

## 2012-05-23 LAB — RETICULOCYTES
RBC.: 2.9 MIL/uL — ABNORMAL LOW (ref 3.87–5.11)
Retic Count, Absolute: 81.2 10*3/uL (ref 19.0–186.0)

## 2012-05-23 LAB — MRSA PCR SCREENING: MRSA by PCR: NEGATIVE

## 2012-05-23 LAB — URINALYSIS, ROUTINE W REFLEX MICROSCOPIC
Bilirubin Urine: NEGATIVE
Glucose, UA: NEGATIVE mg/dL
Protein, ur: NEGATIVE mg/dL
Urobilinogen, UA: 0.2 mg/dL (ref 0.0–1.0)

## 2012-05-23 LAB — PREPARE RBC (CROSSMATCH)

## 2012-05-23 LAB — OCCULT BLOOD, POC DEVICE: Fecal Occult Bld: NEGATIVE

## 2012-05-23 LAB — LACTIC ACID, PLASMA: Lactic Acid, Venous: 1.3 mmol/L (ref 0.5–2.2)

## 2012-05-23 LAB — PROTIME-INR: INR: 1 (ref 0.00–1.49)

## 2012-05-23 LAB — TROPONIN I
Troponin I: <0.3 ng/mL (ref <0.30)
Troponin I: <0.3 ng/mL (ref <0.30)

## 2012-05-23 MED ORDER — TERAZOSIN HCL 1 MG PO CAPS
1.0000 mg | ORAL_CAPSULE | Freq: Every day | ORAL | Status: DC
  Administered 2012-05-24 – 2012-05-25 (×2): 1 mg via ORAL
  Filled 2012-05-23 (×2): qty 1

## 2012-05-23 MED ORDER — HYDROCORTISONE ACETATE 25 MG RE SUPP
25.0000 mg | Freq: Two times a day (BID) | RECTAL | Status: DC
  Administered 2012-05-23 – 2012-05-25 (×5): 25 mg via RECTAL
  Filled 2012-05-23 (×8): qty 1

## 2012-05-23 MED ORDER — ACETAMINOPHEN 325 MG PO TABS: 650.0000 mg | ORAL_TABLET | Freq: Four times a day (QID) | ORAL | Status: DC | PRN

## 2012-05-23 MED ORDER — RAMIPRIL 10 MG PO TABS: 10.0000 mg | ORAL_TABLET | Freq: Every day | ORAL | Status: DC

## 2012-05-23 MED ORDER — SIMVASTATIN 40 MG PO TABS
40.0000 mg | ORAL_TABLET | Freq: Every day | ORAL | Status: DC
  Filled 2012-05-23 (×2): qty 1

## 2012-05-23 MED ORDER — SUPER HIGH VITAMINS/MINERALS PO TABS: 1.0000 | ORAL_TABLET | Freq: Every day | ORAL | Status: DC

## 2012-05-23 MED ORDER — PANTOPRAZOLE SODIUM 40 MG IV SOLR
40.0000 mg | Freq: Once | INTRAVENOUS | Status: AC
  Administered 2012-05-23: 40 mg via INTRAVENOUS
  Filled 2012-05-23: qty 40

## 2012-05-23 MED ORDER — FUROSEMIDE 10 MG/ML IJ SOLN
INTRAMUSCULAR | Status: AC
  Administered 2012-05-23: 20 mg via INTRAVENOUS
  Filled 2012-05-23: qty 4

## 2012-05-23 MED ORDER — METOPROLOL SUCCINATE ER 100 MG PO TB24
100.0000 mg | ORAL_TABLET | Freq: Every day | ORAL | Status: DC
  Administered 2012-05-24 – 2012-05-25 (×2): 100 mg via ORAL
  Filled 2012-05-23 (×3): qty 1

## 2012-05-23 MED ORDER — SODIUM CHLORIDE 0.9 % IV SOLN
1000.0000 mL | INTRAVENOUS | Status: DC
  Administered 2012-05-23: 1000 mL via INTRAVENOUS

## 2012-05-23 MED ORDER — SODIUM CHLORIDE 0.9 % IV SOLN: 250.0000 mL | INTRAVENOUS | Status: DC | PRN

## 2012-05-23 MED ORDER — FUROSEMIDE 10 MG/ML IJ SOLN
40.0000 mg | Freq: Once | INTRAMUSCULAR | Status: AC
  Administered 2012-05-24: 40 mg via INTRAVENOUS
  Filled 2012-05-23: qty 4

## 2012-05-23 MED ORDER — NITROGLYCERIN 0.4 MG SL SUBL: 0.4000 mg | SUBLINGUAL_TABLET | SUBLINGUAL | Status: DC | PRN

## 2012-05-23 MED ORDER — EZETIMIBE 10 MG PO TABS
10.0000 mg | ORAL_TABLET | Freq: Every day | ORAL | Status: DC
  Administered 2012-05-24 – 2012-05-25 (×2): 10 mg via ORAL
  Filled 2012-05-23 (×3): qty 1

## 2012-05-23 MED ORDER — PANTOPRAZOLE SODIUM 40 MG IV SOLR
40.0000 mg | Freq: Two times a day (BID) | INTRAVENOUS | Status: DC
  Administered 2012-05-23: 40 mg via INTRAVENOUS
  Filled 2012-05-23 (×3): qty 40

## 2012-05-23 MED ORDER — FUROSEMIDE 10 MG/ML IJ SOLN
20.0000 mg | Freq: Once | INTRAMUSCULAR | Status: AC
  Administered 2012-05-23: 20 mg via INTRAVENOUS

## 2012-05-23 MED ORDER — RAMIPRIL 10 MG PO CAPS
10.0000 mg | ORAL_CAPSULE | Freq: Every day | ORAL | Status: DC
  Administered 2012-05-24 – 2012-05-25 (×2): 10 mg via ORAL
  Filled 2012-05-23 (×3): qty 1

## 2012-05-23 MED ORDER — ONDANSETRON HCL 4 MG PO TABS
4.0000 mg | ORAL_TABLET | Freq: Four times a day (QID) | ORAL | Status: DC | PRN
  Filled 2012-05-23: qty 0.5

## 2012-05-23 MED ORDER — ONDANSETRON HCL 4 MG/2ML IJ SOLN: 4.0000 mg | Freq: Three times a day (TID) | INTRAMUSCULAR | Status: DC | PRN

## 2012-05-23 MED ORDER — LORAZEPAM 1 MG PO TABS
0.5000 mg | ORAL_TABLET | Freq: Once | ORAL | Status: AC
  Administered 2012-05-23: 0.5 mg via ORAL
  Filled 2012-05-23: qty 1

## 2012-05-23 MED ORDER — ALUM & MAG HYDROXIDE-SIMETH 200-200-20 MG/5ML PO SUSP: 30.0000 mL | Freq: Four times a day (QID) | ORAL | Status: DC | PRN

## 2012-05-23 MED ORDER — SODIUM CHLORIDE 0.9 % IJ SOLN
3.0000 mL | Freq: Two times a day (BID) | INTRAMUSCULAR | Status: DC
  Administered 2012-05-23 – 2012-05-25 (×4): 3 mL via INTRAVENOUS

## 2012-05-23 MED ORDER — NITROGLYCERIN IN D5W 200-5 MCG/ML-% IV SOLN
5.0000 ug/min | INTRAVENOUS | Status: DC
  Administered 2012-05-23: 5 ug/min via INTRAVENOUS

## 2012-05-23 MED ORDER — ROPINIROLE HCL 0.5 MG PO TABS
0.5000 mg | ORAL_TABLET | Freq: Every day | ORAL | Status: DC
  Administered 2012-05-23 – 2012-05-24 (×2): 0.5 mg via ORAL
  Filled 2012-05-23 (×4): qty 1

## 2012-05-23 MED ORDER — LORAZEPAM 0.5 MG PO TABS
0.5000 mg | ORAL_TABLET | Freq: Every day | ORAL | Status: DC
  Administered 2012-05-23 – 2012-05-24 (×2): 0.5 mg via ORAL
  Filled 2012-05-23 (×2): qty 1

## 2012-05-23 MED ORDER — AMLODIPINE BESYLATE 10 MG PO TABS
10.0000 mg | ORAL_TABLET | Freq: Every day | ORAL | Status: DC
  Administered 2012-05-24 – 2012-05-25 (×2): 10 mg via ORAL
  Filled 2012-05-23 (×3): qty 1

## 2012-05-23 MED ORDER — ADULT MULTIVITAMIN W/MINERALS CH
1.0000 | ORAL_TABLET | Freq: Every day | ORAL | Status: DC
  Administered 2012-05-24 – 2012-05-25 (×2): 1 via ORAL
  Filled 2012-05-23 (×3): qty 1

## 2012-05-23 MED ORDER — ONDANSETRON HCL 4 MG/2ML IJ SOLN: 4.0000 mg | Freq: Four times a day (QID) | INTRAMUSCULAR | Status: DC | PRN

## 2012-05-23 MED ORDER — HYDROCODONE-ACETAMINOPHEN 5-325 MG PO TABS: 1.0000 | ORAL_TABLET | Freq: Four times a day (QID) | ORAL | Status: DC | PRN

## 2012-05-23 MED ORDER — SODIUM CHLORIDE 0.9 % IV SOLN
INTRAVENOUS | Status: DC
  Administered 2012-05-23: 16:00:00 via INTRAVENOUS

## 2012-05-23 MED ORDER — SERTRALINE HCL 50 MG PO TABS
50.0000 mg | ORAL_TABLET | Freq: Every day | ORAL | Status: DC
  Administered 2012-05-24 – 2012-05-25 (×2): 50 mg via ORAL
  Filled 2012-05-23 (×4): qty 1

## 2012-05-23 MED ORDER — SODIUM CHLORIDE 0.9 % IJ SOLN: 3.0000 mL | INTRAMUSCULAR | Status: DC | PRN

## 2012-05-23 MED ORDER — HYDROCODONE-ACETAMINOPHEN 5-325 MG PO TABS
1.0000 | ORAL_TABLET | Freq: Two times a day (BID) | ORAL | Status: DC
  Administered 2012-05-23 – 2012-05-26 (×4): 1 via ORAL
  Filled 2012-05-23 (×4): qty 1

## 2012-05-23 MED ORDER — SODIUM CHLORIDE 0.9 % IJ SOLN
3.0000 mL | Freq: Two times a day (BID) | INTRAMUSCULAR | Status: DC
  Administered 2012-05-23 – 2012-05-25 (×5): 3 mL via INTRAVENOUS

## 2012-05-23 MED ORDER — ACETAMINOPHEN 650 MG RE SUPP: 650.0000 mg | Freq: Four times a day (QID) | RECTAL | Status: DC | PRN

## 2012-05-23 NOTE — ED Notes (Signed)
Hydrocortisone suppository 25mg  send with pt to the unit.

## 2012-05-23 NOTE — ED Notes (Signed)
Pharmacy called to verifies and send Hydrocortisone to Pod B, will review and send; talked to [facility].

## 2012-05-23 NOTE — Consult Note (Signed)
Gastroenterology Consult: 1:42 PM 05/23/2012   Referring Provider: Dr Isidoro Donning  Primary Care Physician:  [name], MD.  Last office visit 11/2011.  Primary Gastroenterologist:  Dr. Lina Sar   Reason for Consultation:  Rectal bleeding and anemia.   HPI: Darlene [name] is a 79 y.o. female.  Chronic heart failure.  Remote cardiac stent place 1989. S/p right hemicolectomy 1991 for colon cancer.  10/2011 colonoscopy for anemia (Hgb 5.4, transfused 3 units PRBCs) showed sigmoid colitis, pathology confirmed ischemia, hyperplastic polyps in sigmoid, sigmoid diverticulosis, internal hemorrhoids.  EGD of 07/2011 was normal.  Had intermittent hematochezia following discharge in July 2013 and was readmitted that same month for additional transfusions of 2 units.   She presents to ED today with about 2 weeks of intermittent minor to moderate episodes of painless rectal bleeding. These are not necessarily associated with having BMs.  Stools themselves are brown.  She surmises she has had bleeding episodes about 5 times since onset.  No nausea, anorexia, does not think she has lost weight.  Has not tried any topical rectal therapy.  She came to ED today because she thought she should get the bleeding checked out.  Hgb is 8.3 compared with 10.9 in August 2013. MCV normal.   Home meds include once daily Nexium, 81 mg ASA, Metamucil.      Past Medical History  Diagnosis Date  . UTI (urinary tract infection)   . IBS (irritable bowel syndrome)   . Abdominal pain, left lower quadrant   . CAD (coronary artery disease)   . Pneumonia   . HTN (hypertension)   . Hyperlipidemia   . Diverticulosis of colon   . History of colon cancer   . Allergic rhinitis   . Angina   . Cancer   . Arthritis   . Shortness of breath   . Anemia   . Blood transfusion   . Anxiety     Past Surgical History  Procedure Laterality Date  . Hemicolectomy  1991    Right for mgt of  colon cancer.  . Angioplasty  1989    Stent x 2  . Appendectomy    . Bilateral salpingoophorectomy    . Breast surgery      benign  . Orif hip fracture  12/2009    Dr Charlann Boxer  . Esophagogastroduodenoscopy  08/09/2011    Procedure: ESOPHAGOGASTRODUODENOSCOPY (EGD);  Surgeon: Hart Carwin, MD;  Location: [facility] ENDOSCOPY;  Service: Endoscopy;  Laterality: N/A;  . Colonoscopy  10/24/2011    Procedure: COLONOSCOPY;  Surgeon: Beverley Fiedler, MD;  Location: [facility] ENDOSCOPY;  Service: Gastroenterology;  Laterality: N/A;    Prior to Admission medications   Medication Sig Start Date End Date Taking? Authorizing Provider  amLODipine (NORVASC) 10 MG tablet Take 1 tablet (10 mg total) by mouth daily. 04/30/12   Jacques Navy, MD  aspirin 81 MG chewable tablet Chew 81 mg by mouth daily.    Historical Provider, MD  esomeprazole (NEXIUM) 40 MG capsule Take 40 mg by mouth daily.     Historical Provider, MD  ezetimibe (ZETIA) 10 MG tablet Take 1 tablet (10 mg total) by mouth daily. 04/30/12   Jacques Navy, MD  furosemide (LASIX) 40 MG tablet Take 40 mg by mouth daily.    Historical Provider, MD  guaiFENesin (ROBITUSSIN) 100 MG/5ML SOLN Take 10 mLs by mouth every 4 (four) hours as needed. Congestion.    Historical Provider, MD  HYDROcodone-acetaminophen (NORCO/VICODIN) 5-325 MG per tablet  Take 1 tablet by mouth 2 (two) times daily. For pain. (Scheduled.) 05/14/12   Jacques Navy, MD  LORazepam (ATIVAN) 0.5 MG tablet Take 1 tablet (0.5 mg total) by mouth at bedtime. 12/08/11   Jacques Navy, MD  metoprolol (TOPROL-XL) 100 MG 24 hr tablet Take 100 mg by mouth daily.      Historical Provider, MD  Multiple Vitamins-Minerals (MULTIVITAMIN,TX-MINERALS) tablet Take 1 tablet by mouth daily.      Historical Provider, MD  nitrofurantoin, macrocrystal-monohydrate, (MACROBID) 100 MG capsule Take 1 capsule (100 mg total) by mouth daily. X 10 DAYS 11/14/11   Tresa Garter, MD  nitroGLYCERIN (NITROSTAT) 0.4 MG SL tablet  Place 0.4 mg under the tongue every 5 (five) minutes as needed. For chest pain    Historical Provider, MD  pravastatin (PRAVACHOL) 80 MG tablet Take 80 mg by mouth daily.      Historical Provider, MD  psyllium (HYDROCIL/METAMUCIL) 95 % PACK Take 1 packet by mouth daily.    Historical Provider, MD  ramipril (ALTACE) 10 MG tablet Take 10 mg by mouth daily.      Historical Provider, MD  rOPINIRole (REQUIP) 0.5 MG tablet Take 1 tablet (0.5 mg total) by mouth at bedtime. 11/07/11   Jacques Navy, MD  sertraline (ZOLOFT) 50 MG tablet Take 50 mg by mouth daily.    Historical Provider, MD  terazosin (HYTRIN) 1 MG capsule Take 1 mg by mouth daily.      Historical Provider, MD  triamcinolone cream (KENALOG) 0.1 % Apply topically 2 (two) times daily. 01/25/12   Jacques Navy, MD    Scheduled Meds:   Infusions: . sodium chloride 1,000 mL (05/23/12 1156)  . sodium chloride     PRN Meds: ondansetron (ZOFRAN) IV   Allergies as of 05/23/2012 - Review Complete 05/23/2012  Allergen Reaction Noted  . Antihistamines, chlorpheniramine-type  07/06/2011  . Chocolate Other (See Comments) 05/23/2012    Family History  Problem Relation Age of Onset  . Colon cancer Neg Hx   . Breast cancer Neg Hx     History   Social History  . Marital Status: Legally Separated    Spouse Name: N/A    Number of Children: N/A  . Years of Education: N/A   Occupational History  . Retired    Social History Main Topics  . Smoking status: Never Smoker   . Smokeless tobacco: Not on file  . Alcohol Use: No  . Drug Use: No  . Sexually Active: Not on file   Other Topics Concern  . Lives in a home with her son in [place]   Social History Narrative   PHYSICIAN ROSTER   Cardiology - Dr Jacinto Halim   GI - Dr Jeralene Huff - Dr Caprice Renshaw, Son lives w/her and has multiple medical problems. She has family in [place] - a nephew was recently murdered.      Daily Caffeine - Cokes      END OF LIFE CARE: Patient  hesitant to decide "I will decide at the time." thus she is a full code. She does not want to be a vegetable.     REVIEW OF SYSTEMS: Constitutional:  No new weakness or malaise ENT:  No nose bleeds Pulm:  No cough , no DOE CV:  No chest pain or palps.  No pedal edema.  GU:  No hematuria, frequency or dysuria GI:  No dysphagia.  Heme:  Per HPI.    Transfusions:  Per HPI Neuro:  No headache, not dizzy, no fall.  No double or blurry vision. Gets restless legs at night.  Derm:  No rash, sores, itching Endocrine:  No excessive thirst,  Immunization:  Not queried Travel:  None.    PHYSICAL EXAM: Vital signs in last 24 hours: Temp:  [98.2 F (36.8 C)] 98.2 F (36.8 C) (02/12 1051) Pulse Rate:  [74-83] 77 (02/12 1216) BP: (130-158)/(52-70) 130/53 mmHg (02/12 1216) SpO2:  [99 %] 99 % (02/12 1051)  General: pleasant, very HOH aged WF.  comfortable Head:  No asymmetry or signs of trauma  Eyes:  Squamous lesion at medial lower lid Ears:  Very HOH  Nose:  No discharge or congestion.  Mouth:  Moist, clear oral mm.  No lesions or exudates.  Neck:  No TMG, bruit on right carotid.  No masses Lungs:  Clear B.   No cough or resp distress Heart: RRR.  No MRG Abdomen:  Soft, NT, thin, no mass or HSM.  Active BS.  Marland Kitchen   Rectal: no blood or stool present, small int hemorrhoids.   Musc/Skeltl: no joint swelling.  Arthritic changes in hands Extremities:  No pedal edema  Neurologic:  Oriented x 3.  HOH.  No tremor Skin:  No telangectasia, no rash or sores Tattoos:  None Nodes:  No inguinal adenopathy.   Psych:  Pleasant, a little bit anxious.   Intake/Output from previous day:   Intake/Output this shift:    LAB RESULTS:  Recent Labs  05/23/12 1103  WBC 6.5  HGB 8.3*  HCT 25.9*  PLT 151  MCV 87  BMET Lab Results  Component Value Date   NA 137 05/23/2012   NA 126* 11/02/2011   NA 129* 10/19/2011   K 4.4 05/23/2012   K 4.2 11/02/2011   K 4.4 10/19/2011   CL 99 05/23/2012   CL 88*  11/02/2011   CL 92* 10/19/2011   CO2 28 05/23/2012   CO2 26 11/02/2011   CO2 27 10/19/2011   GLUCOSE 123* 05/23/2012   GLUCOSE 114* 11/02/2011   GLUCOSE 123* 10/19/2011   BUN 20 05/23/2012   BUN 20 11/02/2011   BUN 24* 10/19/2011   CREATININE 1.08 05/23/2012   CREATININE 1.25* 11/02/2011   CREATININE 1.20* 10/19/2011   CALCIUM 9.4 05/23/2012   CALCIUM 9.0 11/02/2011   CALCIUM 8.6 10/19/2011   LFT  Recent Labs  05/23/12 1103  PROT 7.0  ALBUMIN 3.7  AST 19  ALT 11  ALKPHOS 73  BILITOT 0.3   PT/INR Lab Results  Component Value Date   INR 1.00 05/23/2012   INR 0.99 10/19/2011   INR 1.07 08/06/2011    RADIOLOGY STUDIES: No results found.  ENDOSCOPIC STUDIES: 10/2011  Colonoscopy Dr Rhea Belton for persistent, recurrent anemia, history of colon cancer 1991 s/p right hemicolectomy 1) Normal neo-terminal ileum. Healthy-appearing anastomosis.  2) Segmental colitis in the sigmoid colon, query mild ischemia.  Multiple biopsies obtained and sent to pathology.  3) Sessile polyp in the sigmoid colon. Removed and sent to  pathology.  4) Mild sigmoid diverticulosis  5) Internal hemorrhoids Pathology 1. Colon, polyp(s), Sigmoid - HYPERPLASTIC POLYP. - THERE IS NO EVIDENCE OF MALIGNANCY. 2. Colon, biopsy, Sigmoid - FEATURES CONSISTENT WITH ISCHEMIC COLITIS. - THERE IS NO EVIDENCE OF IDIOPATHIC INFLAMMATORY BOWEL DISEASE, DYSPLASIA, OR MALIGNANCY.  07/2011   EGD to 2nd Duodenum Lina Sar for iron def anemia.  1) Normal EGD nothing to account  for GI blood loss   IMPRESSION: *  Intermittent minor rectal bleeding, volume does not sound large enough to be diverticular.  Stuttering pattern more c/w known internal hemorrhoids. As well she could have ongoing ischemic colitis, though generally pt's experience abd pain and tenderness with this.  Do not think the described volume, frequency and duration of bleeding is responsible for the degree of anemia, though it is contributing to anemia. *  Normocytic  anemia.  Not iron deficient in 10/2011.  *  Colon cancer , right hemicolectomy 1991.  Hyperplastic polyp on 10/2011 colonoscopy.  *  Anxiety.  *  Hx heart failure, not active problem at present.   PLAN: *  Obtain anemia studies. If deficiencies present:  Add suppplements *  Add Anusol HC per rectum   LOS: 0 days   Jennye Moccasin  05/23/2012, 1:42 PM Pager: [phone]  GI ATTENDING  History, laboratories, prior endoscopy evaluations reviewed. Patient seen and examined-agree with H&P as outlined above. Patient with MINOR rectal bleeding as described. Most certainly hemorrhoidal. Anemia is chronic and unrelated GI bleeding. Recent colonoscopy 6 months ago as described. Exam unrevealing. Recommend treatment for hemorrhoids as outlined. May have outpatient GI followup, as needed, if needed. Will sign off  Moya Duan N. Eda Keys., M.D. [facility] Division of Gastroenterology

## 2012-05-23 NOTE — H&P (Addendum)
History and Physical       Hospital Admission Note Date: 05/23/2012  Patient name: Darlene [name] Medical record number: [id number] Date of birth: 1921-02-19 Age: 79 y.o. Gender: female PCP: [name], MD    Chief Complaint:  Rectal bleeding for the last 2-3 weeks  HPI: Patient is 79 year old female with history of ?Crohn's disease, IBS, history of UTIs, diverticulosis will presented to ED with complaints of rectal bleeding for the past 2-3 weeks. She had 1 episode this morning. History was obtained from the patient was somewhat poor historian, stated that she needed a transfusion in the past. She denied any chest pain or shortness of breath, dizziness or lightheadedness. She is not on any anticoagulation. He had a coloscopy done in July 2013 which had shown segmental colitis, pathology was consistent with ischemic colitis.    Review of Systems:  Constitutional: Denies fever, chills, diaphoresis, appetite change and fatigue.  HEENT: Denies photophobia, eye pain, redness, hearing loss, ear pain, congestion, sore throat, rhinorrhea, sneezing, mouth sores, trouble swallowing, neck pain, neck stiffness and tinnitus.   Respiratory: Denies cough, chest tightness,  and wheezing.  + DOE  Cardiovascular: Denies chest pain, palpitations and leg swelling.  Gastrointestinal: Denies nausea, vomiting, abdominal pain,  please see history of present illness  in and difficulty urinating.  Musculoskeletal: Denies myalgias, back pain, joint swelling, arthralgias and gait problem.  Skin: Denies pallor, rash and wound.  Neurological: Denies dizziness, seizures, syncope, weakness, light-headedness, numbness and headaches.  Hematological: Denies adenopathy. Easy bruising, personal or family bleeding history  Psychiatric/Behavioral: Denies suicidal ideation, mood changes, confusion, nervousness, sleep disturbance and agitation  Past Medical  History: Past Medical History  Diagnosis Date  . UTI (urinary tract infection)   . IBS (irritable bowel syndrome)   . Abdominal pain, left lower quadrant   . CAD (coronary artery disease)   . Pneumonia   . HTN (hypertension)   . Hyperlipidemia   . Diverticulosis of colon   . History of colon cancer   . Allergic rhinitis   . Angina   . Cancer   . Arthritis   . Shortness of breath   . Anemia   . Blood transfusion   . Anxiety    Past Surgical History  Procedure Laterality Date  . Hemicolectomy  1991    Right for mgt of colon cancer.  . Angioplasty  1989    Stent x 2  . Appendectomy    . Bilateral salpingoophorectomy    . Breast surgery      benign  . Orif hip fracture  12/2009    Dr Charlann Boxer  . Esophagogastroduodenoscopy  08/09/2011    Procedure: ESOPHAGOGASTRODUODENOSCOPY (EGD);  Surgeon: Hart Carwin, MD;  Location: [facility] ENDOSCOPY;  Service: Endoscopy;  Laterality: N/A;  . Colonoscopy  10/24/2011    Procedure: COLONOSCOPY;  Surgeon: Beverley Fiedler, MD;  Location: [facility] ENDOSCOPY;  Service: Gastroenterology;  Laterality: N/A;    Medications: Prior to Admission medications   Medication Sig Start Date End Date Taking? Authorizing Provider  amLODipine (NORVASC) 10 MG tablet Take 1 tablet (10 mg total) by mouth daily. 04/30/12   Jacques Navy, MD  aspirin 81 MG chewable tablet Chew 81 mg by mouth daily.    Historical Provider, MD  esomeprazole (NEXIUM) 40 MG capsule Take 40 mg by mouth daily.     Historical Provider, MD  ezetimibe (ZETIA) 10 MG tablet Take 1 tablet (10 mg total) by mouth daily. 04/30/12   Rosalyn Gess Norins,  MD  furosemide (LASIX) 40 MG tablet Take 40 mg by mouth daily.    Historical Provider, MD  guaiFENesin (ROBITUSSIN) 100 MG/5ML SOLN Take 10 mLs by mouth every 4 (four) hours as needed. Congestion.    Historical Provider, MD  HYDROcodone-acetaminophen (NORCO/VICODIN) 5-325 MG per tablet Take 1 tablet by mouth 2 (two) times daily. For pain. (Scheduled.) 05/14/12   Jacques Navy, MD  LORazepam (ATIVAN) 0.5 MG tablet Take 1 tablet (0.5 mg total) by mouth at bedtime. 12/08/11   Jacques Navy, MD  metoprolol (TOPROL-XL) 100 MG 24 hr tablet Take 100 mg by mouth daily.      Historical Provider, MD  Multiple Vitamins-Minerals (MULTIVITAMIN,TX-MINERALS) tablet Take 1 tablet by mouth daily.      Historical Provider, MD  nitrofurantoin, macrocrystal-monohydrate, (MACROBID) 100 MG capsule Take 1 capsule (100 mg total) by mouth daily. X 10 DAYS 11/14/11   Tresa Garter, MD  nitroGLYCERIN (NITROSTAT) 0.4 MG SL tablet Place 0.4 mg under the tongue every 5 (five) minutes as needed. For chest pain    Historical Provider, MD  pravastatin (PRAVACHOL) 80 MG tablet Take 80 mg by mouth daily.      Historical Provider, MD  psyllium (HYDROCIL/METAMUCIL) 95 % PACK Take 1 packet by mouth daily.    Historical Provider, MD  ramipril (ALTACE) 10 MG tablet Take 10 mg by mouth daily.      Historical Provider, MD  rOPINIRole (REQUIP) 0.5 MG tablet Take 1 tablet (0.5 mg total) by mouth at bedtime. 11/07/11   Jacques Navy, MD  sertraline (ZOLOFT) 50 MG tablet Take 50 mg by mouth daily.    Historical Provider, MD  terazosin (HYTRIN) 1 MG capsule Take 1 mg by mouth daily.      Historical Provider, MD  triamcinolone cream (KENALOG) 0.1 % Apply topically 2 (two) times daily. 01/25/12   Jacques Navy, MD    Allergies:   Allergies  Allergen Reactions  . Antihistamines, Chlorpheniramine-Type     All Antihistamines.  . Chocolate Other (See Comments)    Pt states "makes her sick" and she starts coughing    Social History:  reports that she has never smoked. She does not have any smokeless tobacco history on file. She reports that she does not drink alcohol or use illicit drugs.  Family History: Family History  Problem Relation Age of Onset  . Colon cancer Neg Hx   . Breast cancer Neg Hx     Physical Exam: Blood pressure 130/53, pulse 77, temperature 98.2 F (36.8 C),  temperature source Oral, SpO2 99.00%. General: Alert, awake, oriented x3, in no acute distress,hard of hearing HEENT: normocephalic, atraumatic, anicteric sclera, pink conjunctiva, pupils equal and reactive to light and accomodation, oropharynx clear Neck: supple, no masses or lymphadenopathy, no goiter, no bruits  Heart: Regular rate and rhythm, without murmurs, rubs or gallops. Lungs: Clear to auscultation bilaterally, no wheezing, rales or rhonchi. Abdomen: Soft, nontender, nondistended, positive bowel sounds, no masses. Extremities: No clubbing, cyanosis or edema with positive pedal pulses. Neuro: Grossly intact, no focal neurological deficits, strength 5/5 upper and lower extremities bilaterally Psych: alert and oriented x 3, normal mood and affect Skin: no rashes or lesions, warm and dry   LABS on Admission:  Basic Metabolic Panel:  Recent Labs Lab 05/23/12 1103  NA 137  K 4.4  CL 99  CO2 28  GLUCOSE 123*  BUN 20  CREATININE 1.08  CALCIUM 9.4   Liver Function Tests:  Recent  Labs Lab 05/23/12 1103  AST 19  ALT 11  ALKPHOS 73  BILITOT 0.3  PROT 7.0  ALBUMIN 3.7    Recent Labs Lab 05/23/12 1103  LIPASE 45   No results found for this basename: AMMONIA,  in the last 168 hours CBC:  Recent Labs Lab 05/23/12 1103  WBC 6.5  NEUTROABS 5.2  HGB 8.3*  HCT 25.9*  MCV 87.5  PLT 151   Cardiac Enzymes:  Recent Labs Lab 05/23/12 1116  TROPONINI <0.30   BNP: No components found with this basename: POCBNP,  CBG: No results found for this basename: GLUCAP,  in the last 168 hours   Radiological Exams on Admission: No results found.  Assessment/Plan Principal Problem:   GI (gastrointestinal bleed): Probably diverticular bleeding, no abdominal pain, also has a history of ischemic colitis.  - Admit for obs, tele, serial H/H, transfuse if less than 8 - place on protonix IV, clear liquid diet, GI consultation  Active Problems:   HYPERLIPIDEMIA: cont  statins    HYPERTENSION - Continue outpatient antihypertensive, hold Lasix for now    CORONARY ARTERY DISEASE:  - Hold aspirin, continue beta blocker, ramipril, statins    Crohn disease (?): Patient had a colonoscopy done in July 2013, pathology was negative for IBD - per GI   DVT prophylaxis: SCD's  CODE STATUS: Full Code  Further plan will depend as patient's clinical course evolves and further radiologic and laboratory data become available.   Time Spent on Admission: 1 hour  Taisei Bonnette M.D. [facility] 05/23/2012, 1:25 PM Pager: [phone]  If 7PM-7AM, please contact night-coverage [email or web address] Password [email or web address]  Addendum:  Called by rapid response about chest pain, stat EKG does show ischemic changes in antero-lateral leads, one set troponin is negative so far. Placed on NTG drip, O2, cont BB, ACEI and statin. Unfortunately due to admission history of rectal bleeding and anemia,canot place on ASA or heparin.  She is possibly having demand ischemia due to GIB. Ordered 2 units pRBC's. Discussed with Dr Jacinto Halim, agreed with conservative management. Dr Jacinto Halim is well aware of the patient, however donot feel she is candidate for invasive intervention at this time.  Also check, serial cardiac enzymes, stat BNP, CXR, ECHO, lasix 20mg  IV once (patient has history of chr CHF). Patient is placed on NRB mask. I will transfer the patient to SDU/ICU. I have paged CCM.   Nashid Pellum M.D. [facility] 05/23/2012, 5:53 PM  Pager: [phone]

## 2012-05-23 NOTE — Progress Notes (Signed)
Patient brought up from the ED via stretcher.  Patient complaining of severe chest pain at 10/10.  The transporter reported that the patient's chest pain began on her way up to the floor.  Transported also reported that the patient had not complained of chest pain before.  Rapid Response immediately called.  Patient was immediately placed on O2 and EKG completed.

## 2012-05-23 NOTE — ED Provider Notes (Addendum)
History     CSN: [id number]  Arrival date & time 05/23/12  1040   First MD Initiated Contact with Patient 05/23/12 1045      Chief Complaint  Patient presents with  . Rectal Bleeding    (Consider location/radiation/quality/duration/timing/severity/associated sxs/prior treatment) HPI Comments: Patient present from home with bright red blood per rectum for the past several weeks. She says he has a history of Crohn's disease and has needed a transfusion in the past. Today she had 2 episodes of bright red blood on the toilet bowl. She denies any abdominal pain or rectal pain. No chest pain or shortness of breath. No dizziness or lightheadedness. She is not on any anticoagulation. Her last colonoscopy was July.  The history is provided by the patient.    Past Medical History  Diagnosis Date  . UTI (urinary tract infection)   . IBS (irritable bowel syndrome)   . Abdominal pain, left lower quadrant   . CAD (coronary artery disease)   . Pneumonia   . HTN (hypertension)   . Hyperlipidemia   . Diverticulosis of colon   . History of colon cancer   . Allergic rhinitis   . Angina   . Cancer   . Arthritis   . Shortness of breath   . Anemia   . Blood transfusion   . Anxiety     Past Surgical History  Procedure Laterality Date  . Hemicolectomy  1991    Right for mgt of colon cancer.  . Angioplasty  1989    Stent x 2  . Appendectomy    . Bilateral salpingoophorectomy    . Breast surgery      benign  . Orif hip fracture  12/2009    Dr Charlann Boxer  . Esophagogastroduodenoscopy  08/09/2011    Procedure: ESOPHAGOGASTRODUODENOSCOPY (EGD);  Surgeon: Hart Carwin, MD;  Location: [facility] ENDOSCOPY;  Service: Endoscopy;  Laterality: N/A;  . Colonoscopy  10/24/2011    Procedure: COLONOSCOPY;  Surgeon: Beverley Fiedler, MD;  Location: [facility] ENDOSCOPY;  Service: Gastroenterology;  Laterality: N/A;    Family History  Problem Relation Age of Onset  . Colon cancer Neg Hx   . Breast cancer Neg Hx      History  Substance Use Topics  . Smoking status: Never Smoker   . Smokeless tobacco: Not on file  . Alcohol Use: No    OB History   Grav Para Term Preterm Abortions TAB SAB Ect Mult Living                  Review of Systems  Constitutional: Positive for fatigue. Negative for fever, activity change and appetite change.  HENT: Negative for congestion and rhinorrhea.   Respiratory: Negative for cough and shortness of breath.   Cardiovascular: Negative for chest pain.  Gastrointestinal: Positive for blood in stool, hematochezia and anal bleeding. Negative for nausea, vomiting and abdominal pain.  Genitourinary: Negative for dysuria, vaginal bleeding and vaginal discharge.  Musculoskeletal: Negative for back pain.  Skin: Negative for wound.  Neurological: Positive for dizziness, weakness and light-headedness.  A complete 10 system review of systems was obtained and all systems are negative except as noted in the HPI and PMH.    Allergies  Antihistamines, chlorpheniramine-type and Chocolate  Home Medications   Current Outpatient Rx  Name  Route  Sig  Dispense  Refill  . amLODipine (NORVASC) 10 MG tablet   Oral   Take 1 tablet (10 mg total) by mouth daily.  30 tablet   2   . aspirin 81 MG chewable tablet   Oral   Chew 81 mg by mouth daily.         Marland Kitchen esomeprazole (NEXIUM) 40 MG capsule   Oral   Take 40 mg by mouth daily.          Marland Kitchen ezetimibe (ZETIA) 10 MG tablet   Oral   Take 1 tablet (10 mg total) by mouth daily.   30 tablet   2   . furosemide (LASIX) 40 MG tablet   Oral   Take 40 mg by mouth daily.         Marland Kitchen guaiFENesin (ROBITUSSIN) 100 MG/5ML SOLN   Oral   Take 10 mLs by mouth every 4 (four) hours as needed. Congestion.         Marland Kitchen HYDROcodone-acetaminophen (NORCO/VICODIN) 5-325 MG per tablet   Oral   Take 1 tablet by mouth 2 (two) times daily. For pain. (Scheduled.)   60 tablet   2   . LORazepam (ATIVAN) 0.5 MG tablet   Oral   Take 1  tablet (0.5 mg total) by mouth at bedtime.   30 tablet   5   . metoprolol (TOPROL-XL) 100 MG 24 hr tablet   Oral   Take 100 mg by mouth daily.           . Multiple Vitamins-Minerals (MULTIVITAMIN,TX-MINERALS) tablet   Oral   Take 1 tablet by mouth daily.           . nitrofurantoin, macrocrystal-monohydrate, (MACROBID) 100 MG capsule   Oral   Take 1 capsule (100 mg total) by mouth daily. X 10 DAYS   10 capsule   0   . nitroGLYCERIN (NITROSTAT) 0.4 MG SL tablet   Sublingual   Place 0.4 mg under the tongue every 5 (five) minutes as needed. For chest pain         . pravastatin (PRAVACHOL) 80 MG tablet   Oral   Take 80 mg by mouth daily.           . psyllium (HYDROCIL/METAMUCIL) 95 % PACK   Oral   Take 1 packet by mouth daily.         . ramipril (ALTACE) 10 MG tablet   Oral   Take 10 mg by mouth daily.           Marland Kitchen rOPINIRole (REQUIP) 0.5 MG tablet   Oral   Take 1 tablet (0.5 mg total) by mouth at bedtime.   30 tablet   6   . sertraline (ZOLOFT) 50 MG tablet   Oral   Take 50 mg by mouth daily.         Marland Kitchen terazosin (HYTRIN) 1 MG capsule   Oral   Take 1 mg by mouth daily.           Marland Kitchen triamcinolone cream (KENALOG) 0.1 %   Topical   Apply topically 2 (two) times daily.   30 g   5     BP 159/64  Pulse 91  Temp(Src) 98.3 F (36.8 C) (Oral)  Resp 14  SpO2 99%  Physical Exam  Constitutional: She is oriented to person, place, and time. She appears well-developed and well-nourished. No distress.  HENT:  Head: Normocephalic and atraumatic.  Mouth/Throat: Oropharynx is clear and moist. No oropharyngeal exudate.  Eyes: Conjunctivae and EOM are normal. Pupils are equal, round, and reactive to light.  Neck: Normal range of motion. Neck supple.  Cardiovascular: Normal  rate, regular rhythm and normal heart sounds.   No murmur heard. Pulmonary/Chest: Effort normal and breath sounds normal. No respiratory distress.  Abdominal: There is no tenderness.  There is no rebound and no guarding.  Genitourinary: Guaiac negative stool.  Stool appears dark. No gross bleeding  Musculoskeletal: Normal range of motion. She exhibits no edema and no tenderness.  Neurological: She is alert and oriented to person, place, and time. No cranial nerve deficit. She exhibits normal muscle tone. Coordination normal.  Skin: Skin is warm.    ED Course  Procedures (including critical care time)  Labs Reviewed  CBC WITH DIFFERENTIAL - Abnormal; Notable for the following:    RBC 2.96 (*)    Hemoglobin 8.3 (*)    HCT 25.9 (*)    Neutrophils Relative 81 (*)    All other components within normal limits  COMPREHENSIVE METABOLIC PANEL - Abnormal; Notable for the following:    Glucose, Bld 123 (*)    GFR calc non Af Amer 44 (*)    GFR calc Af Amer 51 (*)    All other components within normal limits  URINALYSIS, ROUTINE W REFLEX MICROSCOPIC - Abnormal; Notable for the following:    Hgb urine dipstick SMALL (*)    All other components within normal limits  PROTIME-INR  LIPASE, BLOOD  LACTIC ACID, PLASMA  TROPONIN I  URINE MICROSCOPIC-ADD ON  VITAMIN B12  FOLATE  IRON AND TIBC  FERRITIN  RETICULOCYTES  OCCULT BLOOD, POC DEVICE  TYPE AND SCREEN   No results found.   1. Rectal bleeding   2. Anemia   3. Crohn disease   4. Diverticulosis of colon (without mention of hemorrhage)   5. GI (gastrointestinal bleed)   6. Unspecified essential hypertension       MDM  Bright red blood per rectum for the past several weeks steadily worsening. History of Crohn's disease requiring transfusion in the past.  Hemoglobin 8.3 from 10. INR normal.  Blood pressure and heart rate stable. No active bleeding and 80. Patient with no abdominal pain or rectal pain.   Normal EGD 4/13. Colonoscopy 7/13 sigmoid colitis, diverticulosis, polyp  Suspect diverticular bleed. We'll admit for serial hemoglobins, monitoring, GI consult. Given IV PPI.   Glynn Octave,  MD 05/23/12 1506  3:15 patient complained of chest pain. EKG shows significant changes with ST elevation in aVR and ST depression elsewhere. Troponin resent. Concern for ischemia. Discussed with Dr. Eldridge Dace who agrees does not meet STEMI criteria.  Recommends d/w Dr. Jacinto Halim who has seen patient in the past. Suspect ischemia.   Dr. Isidoro Donning updated.  2 units of blood ordered at her request. D/w Dr Jacinto Halim. He states due to patient's age and active bleeding there is no indication for intervention. He recommends treating her anemia. She is not a candidate for aspirin or anticoagulation. Dr. Verl Dicker cell [phone] hematocrit or.   Date: 05/23/2012  Rate: 93  Rhythm: normal sinus rhythm  QRS Axis: normal  Intervals: normal  ST/T Wave abnormalities: ST elevations anteriorly and ST depressions diffusely  Conduction Disutrbances:none  Narrative Interpretation:   Old EKG Reviewed: changes noted  CRITICAL CARE Performed by: Glynn Octave   Total critical care time: 30  Critical care time was exclusive of separately billable procedures and treating other patients.  Critical care was necessary to treat or prevent imminent or life-threatening deterioration.  Critical care was time spent personally by me on the following activities: development of treatment plan with patient and/or surrogate as well as  nursing, discussions with consultants, evaluation of patient's response to treatment, examination of patient, obtaining history from patient or surrogate, ordering and performing treatments and interventions, ordering and review of laboratory studies, ordering and review of radiographic studies, pulse oximetry and re-evaluation of patient's condition.   Glynn Octave, MD 05/23/12 [phone]

## 2012-05-23 NOTE — ED Notes (Signed)
Pt to department via EMS- pt reports she has noticed some blood in her stool for the the past 2-3 weeks. Reports she has hx of crohns diease and is concerned about the blood. Denies any pain. BP-140/52 Hr-88

## 2012-05-23 NOTE — Consult Note (Signed)
PULMONARY  / CRITICAL CARE MEDICINE  Name: Darlene [name] MRN: [id number] DOB: 28-Jan-1921    ADMISSION DATE:  05/23/2012 CONSULTATION DATE:  05/23/12  REFERRING MD :  Isidoro Donning TRH  CHIEF COMPLAINT:  Chest pain, GI bleed  BRIEF PATIENT DESCRIPTION: This is an 79 y/o female with Crohn's disease and CAD who was admitted on 05/23/12 with two weeks of lower GI bleeding, chest pain, and ischemic changes on EKG. PCCM consulted by [facility] for consideration of ICU placement.  SIGNIFICANT EVENTS / STUDIES:  2/12 admission  LINES / TUBES: peripheral  CULTURES:   ANTIBIOTICS: none  HISTORY OF PRESENT ILLNESS:  This is an 78 y/o female with Crohn's disease and CAD who was admitted on 05/23/12 with two weeks of lower GI bleeding, chest pain, and ischemic changes on EKG. PCCM consulted by [facility] for consideration of ICU placement. She states that she noticed very small amounts of blood in her stool for two weeks so she came to the ED for evaluation.  In the ED she developed chest pain.  She was seen by cardiology Anselm Jungling) who recommended no intervention.  GI medicine felt the bleeding was due to hemorrhoids, recommended supportive care and signed off.   After arrival to the Med-Surg floor she was hypoxemic, short of breath, and had chest pain and ischemic changes on her EKG.  PCCM was consulted.  She was given lasix and nitroglycerin and her symptoms resolved. She stated that she did not take any of there medications the morning of admission.  She confirmed that her code status is DNR.  PAST MEDICAL HISTORY :  Past Medical History  Diagnosis Date  . UTI (urinary tract infection)   . IBS (irritable bowel syndrome)   . Abdominal pain, left lower quadrant   . CAD (coronary artery disease)   . Pneumonia   . HTN (hypertension)   . Hyperlipidemia   . Diverticulosis of colon   . History of colon cancer   . Allergic rhinitis   . Angina   . Cancer   . Arthritis   . Shortness of breath   . Anemia   . Blood  transfusion   . Anxiety    Past Surgical History  Procedure Laterality Date  . Hemicolectomy  1991    Right for mgt of colon cancer.  . Angioplasty  1989    Stent x 2  . Appendectomy    . Bilateral salpingoophorectomy    . Breast surgery      benign  . Orif hip fracture  12/2009    Dr Charlann Boxer  . Esophagogastroduodenoscopy  08/09/2011    Procedure: ESOPHAGOGASTRODUODENOSCOPY (EGD);  Surgeon: Hart Carwin, MD;  Location: [facility] ENDOSCOPY;  Service: Endoscopy;  Laterality: N/A;  . Colonoscopy  10/24/2011    Procedure: COLONOSCOPY;  Surgeon: Beverley Fiedler, MD;  Location: [facility] ENDOSCOPY;  Service: Gastroenterology;  Laterality: N/A;   Prior to Admission medications   Medication Sig Start Date End Date Taking? Authorizing Provider  amLODipine (NORVASC) 10 MG tablet Take 1 tablet (10 mg total) by mouth daily. 04/30/12   Jacques Navy, MD  aspirin 81 MG chewable tablet Chew 81 mg by mouth daily.    Historical Provider, MD  esomeprazole (NEXIUM) 40 MG capsule Take 40 mg by mouth daily.     Historical Provider, MD  ezetimibe (ZETIA) 10 MG tablet Take 1 tablet (10 mg total) by mouth daily. 04/30/12   Jacques Navy, MD  furosemide (LASIX) 40 MG tablet Take 40  mg by mouth daily.    Historical Provider, MD  guaiFENesin (ROBITUSSIN) 100 MG/5ML SOLN Take 10 mLs by mouth every 4 (four) hours as needed. Congestion.    Historical Provider, MD  HYDROcodone-acetaminophen (NORCO/VICODIN) 5-325 MG per tablet Take 1 tablet by mouth 2 (two) times daily. For pain. (Scheduled.) 05/14/12   Jacques Navy, MD  LORazepam (ATIVAN) 0.5 MG tablet Take 1 tablet (0.5 mg total) by mouth at bedtime. 12/08/11   Jacques Navy, MD  metoprolol (TOPROL-XL) 100 MG 24 hr tablet Take 100 mg by mouth daily.      Historical Provider, MD  Multiple Vitamins-Minerals (MULTIVITAMIN,TX-MINERALS) tablet Take 1 tablet by mouth daily.      Historical Provider, MD  nitrofurantoin, macrocrystal-monohydrate, (MACROBID) 100 MG capsule Take 1  capsule (100 mg total) by mouth daily. X 10 DAYS 11/14/11   Tresa Garter, MD  nitroGLYCERIN (NITROSTAT) 0.4 MG SL tablet Place 0.4 mg under the tongue every 5 (five) minutes as needed. For chest pain    Historical Provider, MD  pravastatin (PRAVACHOL) 80 MG tablet Take 80 mg by mouth daily.      Historical Provider, MD  psyllium (HYDROCIL/METAMUCIL) 95 % PACK Take 1 packet by mouth daily.    Historical Provider, MD  ramipril (ALTACE) 10 MG tablet Take 10 mg by mouth daily.      Historical Provider, MD  rOPINIRole (REQUIP) 0.5 MG tablet Take 1 tablet (0.5 mg total) by mouth at bedtime. 11/07/11   Jacques Navy, MD  sertraline (ZOLOFT) 50 MG tablet Take 50 mg by mouth daily.    Historical Provider, MD  terazosin (HYTRIN) 1 MG capsule Take 1 mg by mouth daily.      Historical Provider, MD  triamcinolone cream (KENALOG) 0.1 % Apply topically 2 (two) times daily. 01/25/12   Jacques Navy, MD   Allergies  Allergen Reactions  . Antihistamines, Chlorpheniramine-Type     All Antihistamines.  . Chocolate Other (See Comments)    Pt states "makes her sick" and she starts coughing    FAMILY HISTORY:  Family History  Problem Relation Age of Onset  . Colon cancer Neg Hx   . Breast cancer Neg Hx    SOCIAL HISTORY:  reports that she has never smoked. She does not have any smokeless tobacco history on file. She reports that she does not drink alcohol or use illicit drugs.  REVIEW OF SYSTEMS:   Gen: Denies fever, chills, weight change, fatigue, night sweats HEENT: Denies blurred vision, double vision, hearing loss, tinnitus, sinus congestion, rhinorrhea, sore throat, neck stiffness, dysphagia PULM: temporary shortness of breath, now resolved, denies cough, sputum production, hemoptysis, wheezing CV: per HPI GI: per HPI GU: Denies dysuria, hematuria, polyuria, oliguria, urethral discharge Endocrine: Denies hot or cold intolerance, polyuria, polyphagia or appetite change Derm: Denies rash,  dry skin, scaling or peeling skin change Heme: Denies easy bruising, bleeding, bleeding gums Neuro: Denies headache, numbness, weakness, slurred speech, loss of memory or consciousness    SUBJECTIVE:   VITAL SIGNS: Temp:  [98.2 F (36.8 C)-98.7 F (37.1 C)] 98.7 F (37.1 C) (02/12 1656) Pulse Rate:  [74-91] 85 (02/12 1656) Resp:  [14-25] 20 (02/12 1656) BP: (124-159)/(52-130) 124/92 mmHg (02/12 1656) SpO2:  [92 %-99 %] 97 % (02/12 1656) HEMODYNAMICS:   VENTILATOR SETTINGS:   INTAKE / OUTPUT: Intake/Output   None     PHYSICAL EXAMINATION:  Gen: chronically ill appearing, no acute distress HEENT: NCAT, EOMi, OP clear, neck supple without masses  PULM: Crackles in bases bilaterally CV: RRR, distant heart sounds, + JVD AB: BS+, soft, nontender, no hsm Ext: warm, trace edema, no clubbing, no cyanosis Derm: no rash or skin breakdown Neuro: A&Ox4, CN II-XII intact, strength 5/5 in all 4 extremities   LABS:  Recent Labs Lab 05/23/12 1103 05/23/12 1109 05/23/12 1116  HGB 8.3*  --   --   WBC 6.5  --   --   PLT 151  --   --   NA 137  --   --   K 4.4  --   --   CL 99  --   --   CO2 28  --   --   GLUCOSE 123*  --   --   BUN 20  --   --   CREATININE 1.08  --   --   CALCIUM 9.4  --   --   AST 19  --   --   ALT 11  --   --   ALKPHOS 73  --   --   BILITOT 0.3  --   --   PROT 7.0  --   --   ALBUMIN 3.7  --   --   INR 1.00  --   --   LATICACIDVEN  --  1.3  --   TROPONINI  --   --  <0.30   No results found for this basename: GLUCAP,  in the last 168 hours  2/12 CXR: interstitial edema bilaterally, no effusions  2/12 EKG (1732) >> NSR, deep ST depression lateral precodial leads 2/12 EKG (1745) >> NSR, resolved ST depression  ASSESSMENT / PLAN:  PULMONARY A: Hypoxemic respiratory failure due to pulmonary edema, improving with lasix P:   -titrate O2 to off over night -agree with diuresis as you are doing  CARDIOVASCULAR A: CAD with ischemic changes due to  demand ischemia in setting of pulmonary edema and not taking anti-anginal therapy on 2/12 CHF (2013 LVEF 40-45%), currently volume up Mild AS Moderate MR P:  -diurese -restart home b-blocker, CCB, ACE -KVO fluids -otherwise per cardiology  RENAL A:  No acute issues P:     GASTROINTESTINAL A:  LGIB due to hemorrhoids not brisk, no actively bleeding now P:   -per GI recs and TRH  HEMATOLOGIC A:  LGIB P:  -as above  INFECTIOUS A:  No acute issues P:    Code Status: I had a lengthy conversation with Darlene [name] who is of sound mind on my exam.  I explained to her that CPR and life support would be likely associated with a high mortality and low likelihood of returning to her current quality of life given her age and extensive comorbid illnesses.  She stated that she knows that she is "old" and does not desire CPR or mechanical ventilation.  I tried to call her son to explain this to him but I could not reach him or leave a message.  TODAY'S SUMMARY: 79 y/o female with very slow LGIB due to hemorrhoids and demand ischemia due to volume overload and not taking anti-anginal therapy.  Her EKG changes and chest pain are now resolved.  She is DNR.  Does not need ICU level care.  Have recommended to [facility] that she go to step-down.  PCCM will likely sign off on 2/13.  I have personally obtained a history, examined the patient, evaluated laboratory and imaging results, formulated the assessment and plan and placed orders. CRITICAL CARE: The patient is critically  ill with multiple organ systems failure and requires high complexity decision making for assessment and support, frequent evaluation and titration of therapies, application of advanced monitoring technologies and extensive interpretation of multiple databases. Critical Care Time devoted to patient care services described in this note is 60 minutes.   Fonnie Jarvis Pulmonary and Critical Care Medicine [facility] Pager:  [phone]  05/23/2012, 6:33 PM

## 2012-05-23 NOTE — Significant Event (Signed)
Rapid Response Event Note  Overview:  Called to see patient with chest pain - req staff to get stat 12 lead and call MD Time Called: 1717 Arrival Time: 1719 Event Type: Cardiac  Initial Focused Assessment: On arrival patient supine in bed - warm and dry - c/o chest pain 9/10 and SOB - no nausea - O2 sats 60% on RA - bil BS present - few crackles noted - RR 26 - 128/63 BP - NS at 75 cc hr -    Interventions: Stat 12 lead EKG shows ST elevations in V2,3,4 with depression in V5,6  - different from 12 lead in ER - placed on NRB mask - NTG started IV at - stat labs - with increase in O2 sats and NTG decrease in distress and chest pain.  Stat PCXR done and fluids decreased to [facility].  20 mg Lasix IV given per order Dr. Isidoro Donning.  BP 117/58 HR 78.  Repeat 12 Lead shows back to baseline.  Dr. Isidoro Donning has spoken with Dr. Jacinto Halim who agrees patient had demand ischemia.  Dr. Isidoro Donning calling CCM re:  ICU vs SDU.  Patient remains pain free - O2 sats 100 % - Dr. Kendrick Fries here - weaned to 5 liter nasal cannula without trouble.  O2 sats remain 98% - patinet denies pain or SOB - states she feels ok.  To transfer to SDU - report was called to ICU staff on 2900 - now will go to TCU on 2900 - report transferred by that staff.  Patient to transfer with 5500 RN.     Event Summary: Name of Physician Notified: Dr. Isidoro Donning at [phone]  Name of Consulting Physician Notified: Ddr. Henrene Pastor at 1745  Outcome: Transferred (Comment) (2917)  Event End Time: 9562  [place]

## 2012-05-23 NOTE — Progress Notes (Signed)
Patient Admitted to 5531. NT arrived with patient and stated patient was having chest pain.  . Patient stating "I'm having a heart attack". Patient transferred over to the bed VS taken and Patient 02 stas in 60's placed on non-rebreather, EKG obtained [facility] called Nitro gtt started and Ptient being transferred to ICU

## 2012-05-24 DIAGNOSIS — I214 Non-ST elevation (NSTEMI) myocardial infarction: Secondary | ICD-10-CM

## 2012-05-24 DIAGNOSIS — E785 Hyperlipidemia, unspecified: Secondary | ICD-10-CM

## 2012-05-24 LAB — CBC
Platelets: 126 10*3/uL — ABNORMAL LOW (ref 150–400)
RDW: 13.9 % (ref 11.5–15.5)
WBC: 6.3 10*3/uL (ref 4.0–10.5)

## 2012-05-24 LAB — CK TOTAL AND CKMB (NOT AT ARMC)
CK, MB: 3.6 ng/mL (ref 0.3–4.0)
Relative Index: INVALID (ref 0.0–2.5)
Relative Index: INVALID (ref 0.0–2.5)

## 2012-05-24 LAB — BASIC METABOLIC PANEL
Calcium: 9 mg/dL (ref 8.4–10.5)
Creatinine, Ser: 0.93 mg/dL (ref 0.50–1.10)
GFR calc Af Amer: 61 mL/min — ABNORMAL LOW (ref >90)
Sodium: 135 mEq/L (ref 135–145)

## 2012-05-24 LAB — VITAMIN B12: Vitamin B-12: 363 pg/mL (ref 211–911)

## 2012-05-24 LAB — IRON AND TIBC
Iron: 18 ug/dL — ABNORMAL LOW (ref 42–135)
Saturation Ratios: 5 % — ABNORMAL LOW (ref 20–55)
UIBC: 347 ug/dL (ref 125–400)

## 2012-05-24 LAB — TROPONIN I: Troponin I: <0.3 ng/mL (ref <0.30)

## 2012-05-24 LAB — FOLATE: Folate: >20 ng/mL

## 2012-05-24 LAB — HEMOGLOBIN AND HEMATOCRIT, BLOOD
HCT: 33.2 % — ABNORMAL LOW (ref 36.0–46.0)
Hemoglobin: 10.4 g/dL — ABNORMAL LOW (ref 12.0–15.0)

## 2012-05-24 MED ORDER — NITROGLYCERIN 0.4 MG SL SUBL: 0.4000 mg | SUBLINGUAL_TABLET | SUBLINGUAL | Status: DC | PRN

## 2012-05-24 MED ORDER — ATORVASTATIN CALCIUM 20 MG PO TABS
20.0000 mg | ORAL_TABLET | Freq: Every day | ORAL | Status: DC
  Administered 2012-05-25: 20 mg via ORAL
  Filled 2012-05-24 (×4): qty 1

## 2012-05-24 MED ORDER — PANTOPRAZOLE SODIUM 40 MG PO TBEC
40.0000 mg | DELAYED_RELEASE_TABLET | Freq: Every day | ORAL | Status: DC
  Administered 2012-05-24 – 2012-05-25 (×2): 40 mg via ORAL
  Filled 2012-05-24: qty 1

## 2012-05-24 MED ORDER — FUROSEMIDE 40 MG PO TABS
40.0000 mg | ORAL_TABLET | Freq: Every day | ORAL | Status: DC
  Administered 2012-05-24 – 2012-05-25 (×2): 40 mg via ORAL
  Filled 2012-05-24 (×3): qty 1

## 2012-05-24 NOTE — Progress Notes (Signed)
Subjective: Patient is well known to me. She has had several previous hospitalizations for GI bleed leading to stress cardiac ischemia without need for intervention and resolution of cardiac symptoms with transfusion. Last admission was in July-August '13: received transfusion, had colonoscopy that was unrevealing, did not require any cardiac intervention for NSTEMI.   She has been stable but developed recurrent hematochezia approximately 3 weeks ago - small amount of blood noticed but chronic recurrence. She developed C/P and shortness of breath and presented to ED where her Hgb was 8.3. EKG with changes consistent with NSTEMI. She was stabilized, transfused followed by increased SOB secondary to fluid overload with BNP of 1460. She was transferred to CCU, given lasix and oxygen and started on nitro drip. Cardiology was called - Dr. Jacinto Halim - who knows the patient well and recommended supportive care but no emergent intervention. She was seen by CCM  And they agreed with diuresis and supportive care.   Patient by previous discussion and reenforced by CCM is DNR.  This AM at exam she is in no distress: no SOB, no C/P  Objective: Lab: Lab Results  Component Value Date   WBC 6.3 05/24/2012   HGB 10.8* 05/24/2012   HCT 33.2* 05/24/2012   MCV 86.4 05/24/2012   PLT 126* 05/24/2012   BMET    Component Value Date/Time   NA 135 05/24/2012 0252   K 3.9 05/24/2012 0252   CL 98 05/24/2012 0252   CO2 29 05/24/2012 0252   GLUCOSE 106* 05/24/2012 0252   BUN 14 05/24/2012 0252   CREATININE 0.93 05/24/2012 0252   CALCIUM 9.0 05/24/2012 0252   GFRNONAA 53* 05/24/2012 0252   GFRAA 61* 05/24/2012 0252     Imaging: CXR: IMPRESSION:  Cardiomegaly with mild early interstitial edema pattern   Scheduled Meds: . amLODipine  10 mg Oral Daily  . ezetimibe  10 mg Oral Daily  . HYDROcodone-acetaminophen  1 tablet Oral BID  . hydrocortisone  25 mg Rectal BID  . LORazepam  0.5 mg Oral QHS  . metoprolol succinate  100  mg Oral Daily  . multivitamin with minerals  1 tablet Oral Daily  . pantoprazole (PROTONIX) IV  40 mg Intravenous Q12H  . ramipril  10 mg Oral Daily  . rOPINIRole  0.5 mg Oral QHS  . sertraline  50 mg Oral Daily  . simvastatin  40 mg Oral q1800  . sodium chloride  3 mL Intravenous Q12H  . sodium chloride  3 mL Intravenous Q12H  . terazosin  1 mg Oral Daily   Continuous Infusions: . nitroGLYCERIN 5 mcg/min (05/23/12 1807)   PRN Meds:.sodium chloride, acetaminophen, acetaminophen, alum & mag hydroxide-simeth, HYDROcodone-acetaminophen, ondansetron (ZOFRAN) IV, ondansetron, sodium chloride   Physical Exam: Filed Vitals:   05/24/12 1000  BP: 174/61  Pulse: 82  Temp:   Resp: 20    Intake/Output Summary (Last 24 hours) at 05/24/12 1034 Last data filed at 05/24/12 1000  Gross per 24 hour  Intake 1053.83 ml  Output   2750 ml  Net -1696.17 ml   Gen'l - very old white woman in no distress HEENT- poor dentition, C&S clear Neck - no JVD Cor - 2+ radial pulse, RRR Pulm - no increased WOB, no rales (suboptimal exam) Abd - BS+, soft Neuro - A&O, recognized examiner, speech is clear.       Assessment/Plan: 1. GI bleed - source has not been well established despite colonscopy this past summer. Hemorrhoids vs AVMs. She is no a candidate  for repeat procedures.  Plan Supportive care - monitor H/H with transfusion threshold of 9g  2. Cardiac - known CAD but not a candidate for intervention. By history her angina/ischemia is anemia related. Her cardiac enzymes were negative  Plan - Will d/c nitro drip  NTG prn  Continue home medications  3. HTN -  BP currently running a bit high. Goal is SBP 150 range.  Plan - continue home meds including lasix.  Code status - DNR   [name] [place] [facility] (o) [phone]; (c) [phone] Call-grp - Patsi Sears IM  Tele: [phone]  05/24/2012, 10:28 AM

## 2012-05-24 NOTE — Progress Notes (Signed)
PULMONARY  / CRITICAL CARE MEDICINE  Name: Darlene [name] MRN: [id number] DOB: Jun 26, 1920    ADMISSION DATE:  05/23/2012 CONSULTATION DATE:  05/23/12  REFERRING MD :  Isidoro Donning TRH  CHIEF COMPLAINT:  Chest pain, GI bleed  BRIEF PATIENT DESCRIPTION: This is an 79 y/o female with Crohn's disease and CAD who was admitted on 05/23/12 with two weeks of lower GI bleeding, chest pain, and ischemic changes on EKG. PCCM consulted by [facility] for consideration of ICU placement.  SIGNIFICANT EVENTS / STUDIES:  2/12 admission  LINES / TUBES: peripheral  CULTURES:   ANTIBIOTICS: none  HISTORY OF PRESENT ILLNESS:  This is an 79 y/o female with Crohn's disease and CAD who was admitted on 05/23/12 with two weeks of lower GI bleeding, chest pain, and ischemic changes on EKG. PCCM consulted by [facility] for consideration of ICU placement. She states that she noticed very small amounts of blood in her stool for two weeks so she came to the ED for evaluation.  In the ED she developed chest pain.  She was seen by cardiology Anselm Jungling) who recommended no intervention.  GI medicine felt the bleeding was due to hemorrhoids, recommended supportive care and signed off.   After arrival to the Med-Surg floor she was hypoxemic, short of breath, and had chest pain and ischemic changes on her EKG.  PCCM was consulted.  She was given lasix and nitroglycerin and her symptoms resolved. She stated that she did not take any of there medications the morning of admission.  She confirmed that her code status is DNR.  SUBJECTIVE:   VITAL SIGNS: Temp:  [97.5 F (36.4 C)-98.7 F (37.1 C)] 98 F (36.7 C) (02/13 0746) Pulse Rate:  [58-94] 86 (02/13 0746) Resp:  [13-25] 20 (02/13 0746) BP: (106-175)/(37-130) 175/75 mmHg (02/13 0746) SpO2:  [92 %-100 %] 100 % (02/13 0746) HEMODYNAMICS:   VENTILATOR SETTINGS:   INTAKE / OUTPUT: Intake/Output     02/12 0701 - 02/13 0700 02/13 0701 - 02/14 0700   P.O. 360    I.V. 257.8    Blood 350    Total Intake 967.8     Urine 2150    Total Output 2150     Net -1182.2           PHYSICAL EXAMINATION:  Gen: chronically ill appearing, no acute distress HEENT: NCAT, EOMi, OP clear, neck supple without masses PULM: Crackles in bases bilaterally CV: RRR, distant heart sounds, + JVD AB: BS+, soft, nontender, no hsm Ext: warm, trace edema, no clubbing, no cyanosis Derm: no rash or skin breakdown Neuro: A&Ox4, CN II-XII intact, strength 5/5 in all 4 extremities  LABS:  Recent Labs Lab 05/23/12 1103 05/23/12 1109 05/23/12 1116 05/23/12 1733 05/24/12 0252 05/24/12 0915  HGB 8.3*  --   --  8.5* 9.3* 10.8*  WBC 6.5  --   --   --  6.3  --   PLT 151  --   --   --  126*  --   NA 137  --   --   --  135  --   K 4.4  --   --   --  3.9  --   CL 99  --   --   --  98  --   CO2 28  --   --   --  29  --   GLUCOSE 123*  --   --   --  106*  --   BUN 20  --   --   --  14  --   CREATININE 1.08  --   --   --  0.93  --   CALCIUM 9.4  --   --   --  9.0  --   AST 19  --   --   --   --   --   ALT 11  --   --   --   --   --   ALKPHOS 73  --   --   --   --   --   BILITOT 0.3  --   --   --   --   --   PROT 7.0  --   --   --   --   --   ALBUMIN 3.7  --   --   --   --   --   INR 1.00  --   --   --   --   --   LATICACIDVEN  --  1.3  --   --   --   --   TROPONINI  --   --  <0.30 <0.30 <0.30  --   PROBNP  --   --   --  1460.0*  --   --    No results found for this basename: GLUCAP,  in the last 168 hours  2/12 CXR: interstitial edema bilaterally, no effusions  2/12 EKG (1732) >> NSR, deep ST depression lateral precodial leads 2/12 EKG (1745) >> NSR, resolved ST depression  ASSESSMENT / PLAN:  PULMONARY A: Hypoxemic respiratory failure due to pulmonary edema, improving with lasix P:   -Titrate O2 as needed. -Continue diureses as tolerated.  CARDIOVASCULAR A: CAD with ischemic changes due to demand ischemia in setting of pulmonary edema and not taking anti-anginal therapy on 2/12 CHF  (2013 LVEF 40-45%), currently volume up Mild AS Moderate MR P:  -Diurese -Continue home b-blocker, CCB, ACE -KVO fluids -Otherwise per cardiology  RENAL A:  No acute issues P:  Monitor BMET and replace electrolytes as needed.  GASTROINTESTINAL A:  LGIB due to hemorrhoids not brisk, no actively bleeding now P:   -Per GI recs and TRH  HEMATOLOGIC A:  LGIB P:  -As above  INFECTIOUS A:  No acute issues P:    Dr. Kendrick Fries spoke with the patient and family, decision was made to make patient a full NCB.  Vitals seem significantly more stable.  May transfer back to med-surg, PCCM will sign off, please call back if needed.  Brendt Dible,MD Pulmonary and Critical Care Medicine [facility] Pager: [phone]  05/24/2012, 10:04 AM

## 2012-05-24 NOTE — Progress Notes (Signed)
  Echocardiogram 2D Echocardiogram has been performed.  Rosland Riding, [facility] 05/24/2012, 11:25 AM

## 2012-05-25 ENCOUNTER — Encounter (HOSPITAL_COMMUNITY): Payer: Self-pay | Admitting: Neurology

## 2012-05-25 ENCOUNTER — Observation Stay (HOSPITAL_COMMUNITY): Payer: Medicare Other

## 2012-05-25 LAB — HEMOGLOBIN AND HEMATOCRIT, BLOOD
HCT: 34.9 % — ABNORMAL LOW (ref 36.0–46.0)
Hemoglobin: 11.3 g/dL — ABNORMAL LOW (ref 12.0–15.0)

## 2012-05-25 MED ORDER — LORAZEPAM 0.5 MG PO TABS
0.5000 mg | ORAL_TABLET | Freq: Two times a day (BID) | ORAL | Status: DC
  Administered 2012-05-25 – 2012-05-26 (×2): 0.5 mg via ORAL
  Filled 2012-05-25 (×2): qty 1

## 2012-05-25 MED ORDER — TERAZOSIN HCL 1 MG PO CAPS
1.0000 mg | ORAL_CAPSULE | Freq: Every day | ORAL | Status: DC
  Filled 2012-05-25: qty 1

## 2012-05-25 NOTE — Progress Notes (Signed)
Subjective: Sitting on the side of the bed. She feels good.   Objective: Lab: Lab Results  Component Value Date   WBC 6.3 05/24/2012   HGB 11.3* 05/25/2012   HCT 34.9* 05/25/2012   MCV 86.4 05/24/2012   PLT 126* 05/24/2012   BMET    Component Value Date/Time   NA 135 05/24/2012 0252   K 3.9 05/24/2012 0252   CL 98 05/24/2012 0252   CO2 29 05/24/2012 0252   GLUCOSE 106* 05/24/2012 0252   BUN 14 05/24/2012 0252   CREATININE 0.93 05/24/2012 0252   CALCIUM 9.0 05/24/2012 0252   GFRNONAA 53* 05/24/2012 0252   GFRAA 61* 05/24/2012 0252   Cardiac Panel (last 3 results)  Recent Labs  05/23/12 1733 05/24/12 0252 05/24/12 0916  CKTOTAL 72 74 97  CKMB 3.2 3.6 4.2*  TROPONINI <0.30 <0.30 <0.30  RELINDX RELATIVE INDEX IS INVALID RELATIVE INDEX IS INVALID RELATIVE INDEX IS INVALID   ]  Imaging: CXR: IMPRESSION:  Near total interval resolution of pulmonary edema.  Mild bibasilar atelectasis persists.  2 D echo 05/24/12: Study Conclusions  - Left ventricle: The cavity size was mildly dilated. Systolic function was moderately reduced. The estimated ejection fraction was in the range of 35% to 40%. Diffuse hypokinesis. Severe hypokinesis of the entirelateral and inferolateral myocardium; consistent with infarction. Aneurysmal deformity of the basalinferior myocardium. Doppler parameters are consistent with abnormal left ventricular relaxation (grade 1 diastolic dysfunction). Doppler parameters are consistent with high ventricular filling pressure. - Mitral valve: Calcified annulus. Mildly thickened leaflets . - Left atrium: The atrium was moderately dilated. - Atrial septum: No defect or patent foramen ovale was identified. - Pulmonary arteries: PA peak pressure: 51mm Hg (S). Impressions:  - The right ventricular systolic pressure was increased consistent with moderate pulmonary hypertension.    Scheduled Meds: . amLODipine  10 mg Oral Daily  . atorvastatin  20 mg Oral q1800  .  ezetimibe  10 mg Oral Daily  . furosemide  40 mg Oral Daily  . HYDROcodone-acetaminophen  1 tablet Oral BID  . hydrocortisone  25 mg Rectal BID  . LORazepam  0.5 mg Oral QHS  . metoprolol succinate  100 mg Oral Daily  . multivitamin with minerals  1 tablet Oral Daily  . pantoprazole  40 mg Oral Daily  . ramipril  10 mg Oral Daily  . rOPINIRole  0.5 mg Oral QHS  . sertraline  50 mg Oral Daily  . sodium chloride  3 mL Intravenous Q12H  . sodium chloride  3 mL Intravenous Q12H  . terazosin  1 mg Oral Daily   Continuous Infusions:  PRN Meds:.sodium chloride, acetaminophen, acetaminophen, alum & mag hydroxide-simeth, nitroGLYCERIN, ondansetron (ZOFRAN) IV, ondansetron, sodium chloride   Physical Exam: Filed Vitals:   05/25/12 0800  BP: 146/52  Pulse: 76  Temp:   Resp: 16  gen'l - spry 79 y/o in no distress Cor - RRR Pulm - CTAP Neuro - A&O x 3      Assessment/Plan: 1. GI bleed - Hgb stable post- transfusion Plan - H/H in AM  2. Cardiac - no chest pain. Echo results noted - no signficant change. CXR  Pulmonary edema resolved Plan - transfer to med/surg bed  3. HTN - adequate control  Dispo - home in AM if H/H and Bmet normal.    [name] [place] [facility] (o) [phone]; (c) [phone] Call-grp - Patsi Sears IM  Tele: [phone]  05/25/2012, 10:29 AM

## 2012-05-26 LAB — HEMOGLOBIN AND HEMATOCRIT, BLOOD
HCT: 31.6 % — ABNORMAL LOW (ref 36.0–46.0)
Hemoglobin: 10.1 g/dL — ABNORMAL LOW (ref 12.0–15.0)

## 2012-05-26 LAB — BASIC METABOLIC PANEL
CO2: 29 mEq/L (ref 19–32)
Chloride: 94 mEq/L — ABNORMAL LOW (ref 96–112)
Creatinine, Ser: 1.45 mg/dL — ABNORMAL HIGH (ref 0.50–1.10)
GFR calc Af Amer: 36 mL/min — ABNORMAL LOW (ref >90)
Potassium: 4.2 mEq/L (ref 3.5–5.1)

## 2012-05-26 MED ORDER — PANTOPRAZOLE SODIUM 40 MG PO TBEC: 40.0000 mg | DELAYED_RELEASE_TABLET | Freq: Every day | ORAL | Status: DC

## 2012-05-26 NOTE — Progress Notes (Signed)
Subjective: Feels ok. Has a sore toe  Objective: Lab: Lab Results  Component Value Date   WBC 6.3 05/24/2012   HGB 10.1* 05/26/2012   HCT 31.6* 05/26/2012   MCV 86.4 05/24/2012   PLT 126* 05/24/2012   BMET    Component Value Date/Time   NA 134* 05/26/2012 0500   K 4.2 05/26/2012 0500   CL 94* 05/26/2012 0500   CO2 29 05/26/2012 0500   GLUCOSE 100* 05/26/2012 0500   BUN 30* 05/26/2012 0500   CREATININE 1.45* 05/26/2012 0500   CALCIUM 9.3 05/26/2012 0500   GFRNONAA 31* 05/26/2012 0500   GFRAA 36* 05/26/2012 0500   Cardiac Panel (last 3 results)  Recent Labs  05/23/12 1733 05/24/12 0252 05/24/12 0916  CKTOTAL 72 74 97  CKMB 3.2 3.6 4.2*  TROPONINI <0.30 <0.30 <0.30  RELINDX RELATIVE INDEX IS INVALID RELATIVE INDEX IS INVALID RELATIVE INDEX IS INVALID      Imaging: 2 D echo: Study Conclusions  - Left ventricle: The cavity size was mildly dilated. Systolic function was moderately reduced. The estimated ejection fraction was in the range of 35% to 40%. Diffuse hypokinesis. Severe hypokinesis of the entirelateral and inferolateral myocardium; consistent with infarction. Aneurysmal deformity of the basalinferior myocardium. Doppler parameters are consistent with abnormal left ventricular relaxation (grade 1 diastolic dysfunction). Doppler parameters are consistent with high ventricular filling pressure. - Mitral valve: Calcified annulus. Mildly thickened leaflets . - Left atrium: The atrium was moderately dilated. - Atrial septum: No defect or patent foramen ovale was identified. - Pulmonary arteries: PA peak pressure: 51mm Hg (S). Impressions:  - The right ventricular systolic pressure was increased consistent with moderate pulmonary hypertension.  CXR 05/25/12: IMPRESSION:  Near total interval resolution of pulmonary edema.  Mild bibasilar atelectasis persists.    Scheduled Meds: . amLODipine  10 mg Oral Daily  . atorvastatin  20 mg Oral q1800  . ezetimibe  10 mg  Oral Daily  . furosemide  40 mg Oral Daily  . HYDROcodone-acetaminophen  1 tablet Oral BID  . hydrocortisone  25 mg Rectal BID  . LORazepam  0.5 mg Oral BID  . metoprolol succinate  100 mg Oral Daily  . multivitamin with minerals  1 tablet Oral Daily  . pantoprazole  40 mg Oral Daily  . ramipril  10 mg Oral Daily  . rOPINIRole  0.5 mg Oral QHS  . sertraline  50 mg Oral Daily  . sodium chloride  3 mL Intravenous Q12H  . sodium chloride  3 mL Intravenous Q12H  . terazosin  1 mg Oral QHS   Continuous Infusions:  PRN Meds:.sodium chloride, acetaminophen, acetaminophen, alum & mag hydroxide-simeth, nitroGLYCERIN, ondansetron (ZOFRAN) IV, ondansetron, sodium chloride   Physical Exam: Filed Vitals:   05/26/12 0602  BP: 123/57  Pulse: 56  Temp: 97.8 F (36.6 C)  Resp: 17   See d/c summary     Assessment/Plan: For d/c home. See d/c summary # [id number]   [name] [place] [facility] (o) [phone]; (c) [phone] Call-grp - Patsi Sears IM  Tele: [phone]  05/26/2012, 9:13 AM

## 2012-05-26 NOTE — Discharge Summary (Signed)
[name], [name]             ACCOUNT NO.:  [id number]  MEDICAL RECORD NO.:  [id number]  LOCATION:  6N18C                        FACILITY:  [facility]  PHYSICIAN:  Darlene Gess. Jaterrius Ricketson, MD  DATE OF BIRTH:  1920/10/02  DATE OF ADMISSION:  05/23/2012 DATE OF DISCHARGE:  05/26/2012                              DISCHARGE SUMMARY   ADMITTING DIAGNOSES: 1. Hematochezia. 2. Anemia-induced ischemia.  DISCHARGE DIAGNOSES: 1. Hematochezia. 2. Anemia-induced ischemia.  CONSULTANTS:  [name], MD, from Cardiology.  PROCEDURES: 1. Chest x-ray on day of admission which showed cardiomegaly with mild     interstitial edema pattern. 2. Chest x-ray on May 25, 2012, which revealed near total     interval resolution of pulmonary edema.  Mild bibasilar atelectasis     persist.  HISTORY OF PRESENT ILLNESS:  Darlene [name] is an 79 year old woman who has had several admissions for hematochezia leading to anemia, leading to ischemic changes, and chest discomfort.  She has known coronary artery disease, but has not been a candidate for the last several admissions for any intervention.  She is followed closely by Dr. Jacinto Halim.  The patient presented to the emergency department complaining of rectal bleeding that had been intermittent for 2-3 weeks including an episode on the morning of admission.  The patient had come in the ED where she had required transfusion in the past.  She denied any chest pain or shortness of breath, dizziness or lightheadedness.  Of note, the patient had a colonoscopy in July 2013 for a similar admission and had segmental colitis, but no significant pathology was otherwise identified.  In the emergency department, the patient did have EKG changes suggestive of non- ST elevation MI.  The patient was subsequently admitted for management of her anemia.  HOSPITAL COURSE: 1. Anemia.  The patient was transfused 2 units of packed red cells     with an appropriate rise in  her hemoglobin to 11.3 g.  This slowly     drifted down to 10.1 g by the day of discharge.  She has had no     recurrent lower GI bleeding.  She had been seen in consultation by     Dr. Marina Goodell from the GI Service, who felt that no further evaluation     or intervention was required.  With the patient having no recurrent episodes of hematochezia, with her hemoglobin being stable, this problem is considered stable and she is ready for discharge.  2. Cardiology.  The patient with EKG changes suggestive of non-ST     elevation MI.  Cardiac enzymes were cycled x3 and were negative.     The patient did have a 2D echo which revealed ejection fraction of     35-40%, slightly down from her last 2D echo in July with diffuse     hypokinesis.  The patient did have severe hypokinesis of the entire     lateral and inferolateral myocardium which is unchanged.  She had     aneurysmal deformity of the basal inferior  myocardium, again     unchanged.  She had grade 1 diastolic dysfunction.  The patient did     not require any intervention.  She had no chest pain or chest     discomfort.  With the patient having normal cardiac enzymes, with     resolution of any chest pain or chest discomfort, the 2D echo was     as noted.  She was thought to be stable and ready for discharge to     home.  She will continue on all of her pre-hospital medications. 3. Hypertension.  The patient's blood pressure has been adequately     controlled, and she will continue on her home medications.  DISCHARGE EXAMINATION:  VITAL SIGNS:  Temperature was 97.8, blood pressure 123/57, heart rate 56, respirations 17, oxygen saturation on room air was 93%. GENERAL APPEARANCE:  This is an elderly Caucasian woman sitting on the side of bed in no acute distress. HEENT:  Normocephalic, atraumatic.  Conjunctivae and sclerae were clear. The patient has a long-standing lesion, basal cell carcinoma, at the medial aspect of the left lower  eyelid. NECK:  Supple. CHEST:  The patient is moving air well with no rales, wheezes, or rhonchi. CARDIOVASCULAR:  Radial pulse 2+.  Her precordium was quiet.  She had a regular rate and rhythm. ABDOMEN:  Soft.  She had no guarding or rebound.  She had positive bowel sounds. NEUROLOGIC:  The patient is awake, alert.  She is oriented to person, place, time, and context.  FINAL LABORATORY:  H and H from the day of discharge with a hemoglobin of 10.1 g and hematocrit 31.6%.  Final chemistries from the May 26, 2012, with a sodium of 134, potassium 4.2, chloride of 94, CO2 of 29, BUN of 30, creatinine 1.45.  Cardiac enzymes with troponin I which was negative at less than 0.3 x3.  DISPOSITION:  The patient is to be discharged to home.  She will be seen in the office for followup in 7-10 days at which time she will have a repeat hemoglobin and hematocrit.  The patient's condition at time of discharge dictation is stable but guarded given her advanced age and underlying comorbidities.     Darlene Gess Nikiah Goin, MD     MEN/MEDQ  D:  05/26/2012  T:  05/26/2012  Job:  [id number]  cc:   [name], MD

## 2012-05-27 LAB — TYPE AND SCREEN: ABO/RH(D): A POS

## 2012-05-28 ENCOUNTER — Telehealth: Payer: Self-pay | Admitting: Internal Medicine

## 2012-05-28 MED ORDER — LORAZEPAM 0.5 MG PO TABS: 0.5000 mg | ORAL_TABLET | Freq: Two times a day (BID) | ORAL | Status: DC

## 2012-05-28 NOTE — Telephone Encounter (Signed)
Rx for Ativan called into [facility].

## 2012-05-28 NOTE — Telephone Encounter (Signed)
Requesting Lorazepam quantity of 60 to be called in to her pharmacy.

## 2012-05-28 NOTE — Telephone Encounter (Signed)
Pt called requesting rx for her ativan. Called pt and she told me the drug store called and they have her rx to deliver.

## 2012-05-28 NOTE — Telephone Encounter (Signed)
This is ok. We discussed increasing her ativan at her hospital d/c  Needs hospital follow up next Monday

## 2012-05-29 ENCOUNTER — Telehealth: Payer: Self-pay | Admitting: Internal Medicine

## 2012-05-29 NOTE — Telephone Encounter (Signed)
Message copied by Newell Coral on Tue May 29, 2012  9:23 AM ------      Message from: Jacques Navy      Created: Sat May 26, 2012  9:30 AM       Transition - d/c Saturday 2/15 - rectal bleed             Needs f/u appointment Monday, Feb 24th            Thanks ------

## 2012-05-29 NOTE — Telephone Encounter (Signed)
Called patient to schedule appointment.  The patient states she can not get ride, and is unable to determine exactly when she will be able to get a ride to the office.  I asked her to coordinate with her family, and call the office back when she has transportation.

## 2012-05-31 ENCOUNTER — Telehealth: Payer: Self-pay | Admitting: General Practice

## 2012-05-31 NOTE — Telephone Encounter (Signed)
Attempted to contact patient about f/u hospital visit with Dr. Debby Bud.  Line busy.

## 2012-06-04 ENCOUNTER — Telehealth: Payer: Self-pay | Admitting: General Practice

## 2012-06-04 NOTE — Telephone Encounter (Signed)
Transitional care call: Discharged from hospital on 2/21. Patient states that she is doing well.  No questions regarding hospital dc instructions.  Patient has all medications in the home and denies questions regarding medications. Pt says she is having just a small amount of bright red rectal bleeding.  Appetite is good.   Patient would like to schedule f/u appointment with Dr. Alvera Novel but does not have transportation to the office. Pt states there is no one to bring her. Pt states that a home health aide would be helpful.  Patient denies any concerns that need to be directed to Dr. Alvera Novel.

## 2012-06-05 NOTE — Telephone Encounter (Signed)
Hospital discharge follow up visit scheduled for 06/12/2012 @ 10:30.

## 2012-06-12 ENCOUNTER — Other Ambulatory Visit: Payer: Self-pay | Admitting: *Deleted

## 2012-06-12 ENCOUNTER — Ambulatory Visit: Payer: Medicare Other | Admitting: Internal Medicine

## 2012-06-12 MED ORDER — ROPINIROLE HCL 0.5 MG PO TABS: 0.5000 mg | ORAL_TABLET | Freq: Every day | ORAL | Status: DC

## 2012-06-26 ENCOUNTER — Other Ambulatory Visit: Payer: Self-pay

## 2012-06-26 MED ORDER — LORAZEPAM 0.5 MG PO TABS: 0.5000 mg | ORAL_TABLET | Freq: Two times a day (BID) | ORAL | Status: DC

## 2012-06-26 NOTE — Telephone Encounter (Signed)
RX called in to [facility] per MD

## 2012-06-28 ENCOUNTER — Telehealth: Payer: Self-pay

## 2012-06-28 NOTE — Telephone Encounter (Signed)
Attempted to call pt to let her know this information. Phone just rings, no answering machine.

## 2012-06-28 NOTE — Telephone Encounter (Signed)
Pt calls requesting an antibiotic be sent to [facility] or does she need an appt. She states her 3rd toe on her left foot looks infected. Please advise.

## 2012-06-28 NOTE — Telephone Encounter (Signed)
1. She needs to make a post hospital office visit and have her Hgb checked  2. If she thinks her foot is infected she needs to be seen! Cannot just call in a prescription. I can see her tomorrow at 11:00. If she cannot get to the office she may have to call non-emergent transport to the ED for evaluation and blood count. She should not ignore this potential infection

## 2012-06-29 NOTE — Telephone Encounter (Signed)
I called and spoke to pt and stated she needs to come in for an OV per Dr Debby Bud. She states she has an appt next Thursday. I let her know Dr Debby Bud is able to see her today at 11:00 but she states she does not have transportation so she will have to wait until next week.

## 2012-07-05 ENCOUNTER — Ambulatory Visit: Payer: Medicare Other | Admitting: Internal Medicine

## 2012-07-16 ENCOUNTER — Other Ambulatory Visit (INDEPENDENT_AMBULATORY_CARE_PROVIDER_SITE_OTHER): Payer: Medicare Other

## 2012-07-16 ENCOUNTER — Ambulatory Visit (INDEPENDENT_AMBULATORY_CARE_PROVIDER_SITE_OTHER): Payer: Medicare Other | Admitting: Internal Medicine

## 2012-07-16 ENCOUNTER — Encounter: Payer: Self-pay | Admitting: Internal Medicine

## 2012-07-16 VITALS — BP 138/64 | HR 84 | Temp 98.1°F | Resp 20 | Wt 122.0 lb

## 2012-07-16 DIAGNOSIS — I2510 Atherosclerotic heart disease of native coronary artery without angina pectoris: Secondary | ICD-10-CM

## 2012-07-16 DIAGNOSIS — I214 Non-ST elevation (NSTEMI) myocardial infarction: Secondary | ICD-10-CM

## 2012-07-16 DIAGNOSIS — D649 Anemia, unspecified: Secondary | ICD-10-CM

## 2012-07-16 DIAGNOSIS — K922 Gastrointestinal hemorrhage, unspecified: Secondary | ICD-10-CM

## 2012-07-16 DIAGNOSIS — I10 Essential (primary) hypertension: Secondary | ICD-10-CM

## 2012-07-16 LAB — COMPREHENSIVE METABOLIC PANEL
ALT: 17 U/L (ref 0–35)
CO2: 27 mEq/L (ref 19–32)
Calcium: 9 mg/dL (ref 8.4–10.5)
Chloride: 98 mEq/L (ref 96–112)
GFR: 45.32 mL/min — ABNORMAL LOW (ref >60.00)
Sodium: 135 mEq/L (ref 135–145)
Total Protein: 6.9 g/dL (ref 6.0–8.3)

## 2012-07-16 NOTE — Assessment & Plan Note (Signed)
BP Readings from Last 3 Encounters:  07/16/12 138/64  05/26/12 123/57  11/23/11 132/60   Continue present medications.

## 2012-07-16 NOTE — Progress Notes (Signed)
Subjective:    Patient ID: Darlene [name], female    DOB: Jun 20, 1920, 79 y.o.   MRN: [id number]  HPI Darlene [name] was hospitalized for recurrent hematochezia with profound anemia leading to NSTEMI. She has been unable to get to the office for follow up until now - 6 weeks after discharge. She has been doing well: no recurrent hematochezia, no recurrent chest pain. She has not been to see Dr. Jacinto Halim. She has been taking all her medications.  Past Medical History  Diagnosis Date  . UTI (urinary tract infection)   . IBS (irritable bowel syndrome)   . Abdominal pain, left lower quadrant   . CAD (coronary artery disease)   . Pneumonia   . HTN (hypertension)   . Hyperlipidemia   . Diverticulosis of colon   . History of colon cancer   . Allergic rhinitis   . Angina   . Cancer   . Arthritis   . Shortness of breath   . Anemia   . Blood transfusion   . Anxiety    Past Surgical History  Procedure Laterality Date  . Hemicolectomy  1991    Right for mgt of colon cancer.  . Angioplasty  1989    Stent x 2  . Appendectomy    . Bilateral salpingoophorectomy    . Breast surgery      benign  . Orif hip fracture  12/2009    Dr Charlann Boxer  . Esophagogastroduodenoscopy  08/09/2011    Procedure: ESOPHAGOGASTRODUODENOSCOPY (EGD);  Surgeon: Hart Carwin, MD;  Location: [facility] ENDOSCOPY;  Service: Endoscopy;  Laterality: N/A;  . Colonoscopy  10/24/2011    Procedure: COLONOSCOPY;  Surgeon: Beverley Fiedler, MD;  Location: [facility] ENDOSCOPY;  Service: Gastroenterology;  Laterality: N/A;   Family History  Problem Relation Age of Onset  . Colon cancer Neg Hx   . Breast cancer Neg Hx    History   Social History  . Marital Status: Legally Separated    Spouse Name: N/A    Number of Children: N/A  . Years of Education: N/A   Occupational History  . Retired    Social History Main Topics  . Smoking status: Never Smoker   . Smokeless tobacco: Not on file  . Alcohol Use: No  . Drug Use: No  . Sexually  Active: Not on file   Other Topics Concern  . Not on file   Social History Narrative   PHYSICIAN ROSTER   Cardiology - Dr Jacinto Halim   GI - Dr Jeralene Huff - Dr Caprice Renshaw, Son lives w/her and has multiple medical problems. She has family in [place] - a nephew was recently murdered.      Daily Caffeine - Cokes      END OF LIFE CARE: Patient hesitant to decide "I will decide at the time." thus she is a full code. She does not want to be a vegetable.     Current Outpatient Prescriptions on File Prior to Visit  Medication Sig Dispense Refill  . amLODipine (NORVASC) 10 MG tablet Take 1 tablet (10 mg total) by mouth daily.  30 tablet  2  . aspirin 81 MG chewable tablet Chew 81 mg by mouth daily.      Marland Kitchen esomeprazole (NEXIUM) 40 MG capsule Take 40 mg by mouth daily.       Marland Kitchen ezetimibe (ZETIA) 10 MG tablet Take 1 tablet (10 mg total) by mouth daily.  30 tablet  2  . furosemide (LASIX) 40 MG tablet Take 40 mg by mouth daily.      Marland Kitchen HYDROcodone-acetaminophen (NORCO/VICODIN) 5-325 MG per tablet Take 1 tablet by mouth 2 (two) times daily as needed for pain.      Marland Kitchen LORazepam (ATIVAN) 0.5 MG tablet Take 1 tablet (0.5 mg total) by mouth 2 (two) times daily.  60 tablet  0  . metoprolol (TOPROL-XL) 100 MG 24 hr tablet Take 100 mg by mouth daily.        . Multiple Vitamins-Minerals (MULTIVITAMIN,TX-MINERALS) tablet Take 1 tablet by mouth daily.        . nitroGLYCERIN (NITROSTAT) 0.4 MG SL tablet Place 0.4 mg under the tongue every 5 (five) minutes as needed. For chest pain      . pantoprazole (PROTONIX) 40 MG tablet Take 1 tablet (40 mg total) by mouth daily.  30 tablet  5  . pravastatin (PRAVACHOL) 80 MG tablet Take 80 mg by mouth daily.        . ramipril (ALTACE) 10 MG tablet Take 10 mg by mouth daily.        Marland Kitchen rOPINIRole (REQUIP) 0.5 MG tablet Take 1 tablet (0.5 mg total) by mouth at bedtime.  30 tablet  6  . sertraline (ZOLOFT) 50 MG tablet Take 50 mg by mouth daily.      Marland Kitchen terazosin  (HYTRIN) 1 MG capsule Take 1 mg by mouth at bedtime.      . triamcinolone cream (KENALOG) 0.1 % Apply 1 application topically 2 (two) times daily as needed (itching).       No current facility-administered medications on file prior to visit.      Review of Systems System review is negative for any constitutional, cardiac, pulmonary, GI or neuro symptoms or complaints other than as described in the HPI.     Objective:   Physical Exam Filed Vitals:   07/16/12 1026  BP: 138/64  Pulse: 84  Temp: 98.1 F (36.7 C)  Resp: 20   Wt Readings from Last 3 Encounters:  07/16/12 122 lb (55.339 kg)  05/25/12 105 lb (47.628 kg)  11/23/11 115 lb (52.164 kg)   gen'l- elderly white woman in no distress HEENT- large lesion medial aspect left lower eyelid - chronic, enlarging Neck - supple, No JVD Cor- 2+ radial pulse, quiet precordium, II/VI harsh mm heard best at left sternal border, rare PVCs PUlm - clear to exam Neuro - HOH, otherwise normal       Assessment & Plan:

## 2012-07-16 NOTE — Assessment & Plan Note (Signed)
Recent recurrent NSTEMI secondary to anemia. Currently doing well.  Study Conclusions  - Left ventricle: The cavity size was mildly dilated. Systolic function was moderately reduced. The estimated ejection fraction was in the range of 35% to 40%. Diffuse hypokinesis. Severe hypokinesis of the entirelateral and inferolateral myocardium; consistent with infarction. Aneurysmal deformity of the basalinferior myocardium. Doppler parameters are consistent with abnormal left ventricular relaxation (grade 1 diastolic dysfunction). Doppler parameters are consistent with high ventricular filling pressure. - Mitral valve: Calcified annulus. Mildly thickened leaflets . - Left atrium: The atrium was moderately dilated. - Atrial septum: No defect or patent foramen ovale was identified. - Pulmonary arteries: PA peak pressure: 51mm Hg (S).  Doing well. No change in medications.

## 2012-07-16 NOTE — Assessment & Plan Note (Signed)
Had recurrent hematochezia - required transfusion. No further GI workup.  Plan F/u H/H with recommendations to follow.

## 2012-07-16 NOTE — Patient Instructions (Addendum)
1. GI bleeding - it is GREAT news that you have not had any further bleeding. Plan Will check blood count today.  2. Heart - your EKG looks OK to day and you have had no chest pain. That is a good thing Plan  Continue all your present medicaitons  3. Blood pressure - good control. Plan Lab work to check potassium and kidney function  Continue all your medications  Transportation: you should  Be eligible for SCAT - this is public transportation for the handicapped, an you should qualify. They will bring you to the doctor. Call the [employer] [phone] to request transportation.

## 2012-07-17 ENCOUNTER — Other Ambulatory Visit: Payer: Self-pay

## 2012-07-17 MED ORDER — ROPINIROLE HCL 0.5 MG PO TABS: 0.5000 mg | ORAL_TABLET | Freq: Every day | ORAL | Status: DC

## 2012-07-17 NOTE — Telephone Encounter (Signed)
Charlesetta Ivory called concern about Darlene [name]'s medications, Mrs Thomasena Edis stated mrs. Kwasny ask her to call to req all her med to be send [facility]. Please advise.

## 2012-07-17 NOTE — Telephone Encounter (Signed)
Caretaker called requesting new Rx for Requip to [facility] on [address].

## 2012-07-19 ENCOUNTER — Other Ambulatory Visit: Payer: Self-pay

## 2012-07-19 MED ORDER — ESOMEPRAZOLE MAGNESIUM 40 MG PO CPDR: 40.0000 mg | DELAYED_RELEASE_CAPSULE | Freq: Every day | ORAL | Status: DC

## 2012-07-19 MED ORDER — RAMIPRIL 10 MG PO TABS: 10.0000 mg | ORAL_TABLET | Freq: Every day | ORAL | Status: DC

## 2012-07-19 MED ORDER — TRIAMCINOLONE ACETONIDE 0.1 % EX CREA: 1.0000 "application " | TOPICAL_CREAM | Freq: Two times a day (BID) | CUTANEOUS | Status: DC | PRN

## 2012-07-19 MED ORDER — PRAVASTATIN SODIUM 80 MG PO TABS: 80.0000 mg | ORAL_TABLET | Freq: Every day | ORAL | Status: DC

## 2012-07-19 MED ORDER — HYDROCODONE-ACETAMINOPHEN 5-325 MG PO TABS: 1.0000 | ORAL_TABLET | Freq: Two times a day (BID) | ORAL | Status: DC | PRN

## 2012-07-19 MED ORDER — TERAZOSIN HCL 1 MG PO CAPS: 1.0000 mg | ORAL_CAPSULE | Freq: Every day | ORAL | Status: DC

## 2012-07-19 MED ORDER — LORAZEPAM 0.5 MG PO TABS: 0.5000 mg | ORAL_TABLET | Freq: Two times a day (BID) | ORAL | Status: DC

## 2012-07-19 MED ORDER — PANTOPRAZOLE SODIUM 40 MG PO TBEC: 40.0000 mg | DELAYED_RELEASE_TABLET | Freq: Every day | ORAL | Status: DC

## 2012-07-19 MED ORDER — NITROGLYCERIN 0.4 MG SL SUBL: 0.4000 mg | SUBLINGUAL_TABLET | SUBLINGUAL | Status: DC | PRN

## 2012-07-19 MED ORDER — METOPROLOL SUCCINATE ER 100 MG PO TB24: 100.0000 mg | ORAL_TABLET | Freq: Every day | ORAL | Status: DC

## 2012-07-19 MED ORDER — FUROSEMIDE 40 MG PO TABS: 40.0000 mg | ORAL_TABLET | Freq: Every day | ORAL | Status: DC

## 2012-07-19 MED ORDER — ASPIRIN 81 MG PO CHEW: 81.0000 mg | CHEWABLE_TABLET | Freq: Every day | ORAL | Status: DC

## 2012-07-19 MED ORDER — CENTRUM SILVER PO TABS: 1.0000 | ORAL_TABLET | Freq: Every day | ORAL | Status: DC

## 2012-07-19 MED ORDER — AMLODIPINE BESYLATE 10 MG PO TABS: 10.0000 mg | ORAL_TABLET | Freq: Every day | ORAL | Status: AC

## 2012-07-19 MED ORDER — SERTRALINE HCL 50 MG PO TABS: 50.0000 mg | ORAL_TABLET | Freq: Every day | ORAL | Status: DC

## 2012-07-24 ENCOUNTER — Telehealth: Payer: Self-pay

## 2012-07-24 NOTE — Telephone Encounter (Signed)
Message copied by Noreene Larsson on Tue Jul 24, 2012  8:11 AM ------      Message from: [name] E      Created: Mon Jul 23, 2012  5:53 PM       Please call: Hemoglobin is stable at 10.4 grams. This is good. No change in treatment            Thanks ------

## 2012-07-24 NOTE — Telephone Encounter (Signed)
Pt notified of results and had not questions/concerns at this time.

## 2012-07-31 ENCOUNTER — Other Ambulatory Visit: Payer: Self-pay

## 2012-07-31 NOTE — Telephone Encounter (Signed)
Fax from [facility] requesting a refill on Hydrocodone. Please advise.

## 2012-07-31 NOTE — Telephone Encounter (Signed)
Just had one mo refill done apr 10  Too soon

## 2012-08-01 NOTE — Telephone Encounter (Signed)
LM for pharmacy, to soon to refill Hydrocodone

## 2012-08-21 ENCOUNTER — Other Ambulatory Visit: Payer: Self-pay

## 2012-08-21 MED ORDER — LORAZEPAM 0.5 MG PO TABS: 0.5000 mg | ORAL_TABLET | Freq: Two times a day (BID) | ORAL | Status: DC

## 2012-08-21 NOTE — Telephone Encounter (Signed)
Lorazepam called to pharmacy  

## 2012-08-30 ENCOUNTER — Other Ambulatory Visit: Payer: Self-pay

## 2012-08-30 MED ORDER — HYDROCODONE-ACETAMINOPHEN 5-325 MG PO TABS: 1.0000 | ORAL_TABLET | Freq: Two times a day (BID) | ORAL | Status: DC | PRN

## 2012-08-30 NOTE — Telephone Encounter (Signed)
Done hardcopy to [facility]  

## 2012-08-30 NOTE — Telephone Encounter (Signed)
Faxed hardcopy to [facility] [address] [place] 

## 2012-08-30 NOTE — Telephone Encounter (Signed)
Did not get hardcopy

## 2012-09-18 ENCOUNTER — Other Ambulatory Visit: Payer: Self-pay | Admitting: Internal Medicine

## 2012-09-18 NOTE — Telephone Encounter (Signed)
Lorazepam called to pharmacy  

## 2012-09-26 ENCOUNTER — Telehealth: Payer: Self-pay | Admitting: Internal Medicine

## 2012-09-27 ENCOUNTER — Telehealth: Payer: Self-pay

## 2012-09-27 MED ORDER — HYDROCODONE-ACETAMINOPHEN 5-325 MG PO TABS: ORAL_TABLET | ORAL | Status: DC

## 2012-09-27 NOTE — Telephone Encounter (Signed)
Patient calls in for hydrocodone refill. I let her know this has been sent to [place] by the pharmacy. I will call her when approved.

## 2012-09-27 NOTE — Telephone Encounter (Signed)
Okay for refill in Dr Norins absence?  

## 2012-09-27 NOTE — Telephone Encounter (Signed)
Hydrocodone called to pharmacy. Attempted to notify patient but no answer

## 2012-09-27 NOTE — Addendum Note (Signed)
Addended by: Corwin Levins on: 09/27/2012 05:47 PM   Modules accepted: Orders

## 2012-09-27 NOTE — Telephone Encounter (Signed)
Done hardcopy to [facility]  

## 2012-09-28 NOTE — Telephone Encounter (Signed)
Pt called again.  Did not see it in the cabinet.  Her pharmacy has not called her to let her know they have it.

## 2012-09-28 NOTE — Telephone Encounter (Signed)
Patient notified Hydrocodone already called to pharmacy

## 2012-10-07 ENCOUNTER — Inpatient Hospital Stay (HOSPITAL_COMMUNITY)
Admission: EM | Admit: 2012-10-07 | Discharge: 2012-10-13 | DRG: 377 | Disposition: A | Discharge status: Hospice | Payer: Medicare Other | Attending: Internal Medicine | Admitting: Internal Medicine

## 2012-10-07 ENCOUNTER — Emergency Department (HOSPITAL_COMMUNITY): Payer: Medicare Other

## 2012-10-07 ENCOUNTER — Encounter (HOSPITAL_COMMUNITY): Payer: Self-pay | Admitting: Emergency Medicine

## 2012-10-07 DIAGNOSIS — R9431 Abnormal electrocardiogram [ECG] [EKG]: Secondary | ICD-10-CM

## 2012-10-07 DIAGNOSIS — R531 Weakness: Secondary | ICD-10-CM

## 2012-10-07 DIAGNOSIS — R0600 Dyspnea, unspecified: Secondary | ICD-10-CM

## 2012-10-07 DIAGNOSIS — Z66 Do not resuscitate: Secondary | ICD-10-CM | POA: Diagnosis not present

## 2012-10-07 DIAGNOSIS — Z9861 Coronary angioplasty status: Secondary | ICD-10-CM

## 2012-10-07 DIAGNOSIS — I214 Non-ST elevation (NSTEMI) myocardial infarction: Secondary | ICD-10-CM | POA: Diagnosis present

## 2012-10-07 DIAGNOSIS — F411 Generalized anxiety disorder: Secondary | ICD-10-CM | POA: Diagnosis present

## 2012-10-07 DIAGNOSIS — C44111 Basal cell carcinoma of skin of unspecified eyelid, including canthus: Secondary | ICD-10-CM

## 2012-10-07 DIAGNOSIS — J9601 Acute respiratory failure with hypoxia: Secondary | ICD-10-CM | POA: Diagnosis present

## 2012-10-07 DIAGNOSIS — K922 Gastrointestinal hemorrhage, unspecified: Principal | ICD-10-CM

## 2012-10-07 DIAGNOSIS — Z515 Encounter for palliative care: Secondary | ICD-10-CM

## 2012-10-07 DIAGNOSIS — G9340 Encephalopathy, unspecified: Secondary | ICD-10-CM | POA: Diagnosis present

## 2012-10-07 DIAGNOSIS — I509 Heart failure, unspecified: Secondary | ICD-10-CM

## 2012-10-07 DIAGNOSIS — J9600 Acute respiratory failure, unspecified whether with hypoxia or hypercapnia: Secondary | ICD-10-CM

## 2012-10-07 DIAGNOSIS — K219 Gastro-esophageal reflux disease without esophagitis: Secondary | ICD-10-CM | POA: Diagnosis present

## 2012-10-07 DIAGNOSIS — I428 Other cardiomyopathies: Secondary | ICD-10-CM | POA: Diagnosis present

## 2012-10-07 DIAGNOSIS — I252 Old myocardial infarction: Secondary | ICD-10-CM

## 2012-10-07 DIAGNOSIS — I10 Essential (primary) hypertension: Secondary | ICD-10-CM

## 2012-10-07 DIAGNOSIS — D649 Anemia, unspecified: Secondary | ICD-10-CM

## 2012-10-07 DIAGNOSIS — I739 Peripheral vascular disease, unspecified: Secondary | ICD-10-CM | POA: Diagnosis present

## 2012-10-07 DIAGNOSIS — J811 Chronic pulmonary edema: Secondary | ICD-10-CM

## 2012-10-07 DIAGNOSIS — D62 Acute posthemorrhagic anemia: Secondary | ICD-10-CM | POA: Diagnosis present

## 2012-10-07 DIAGNOSIS — R079 Chest pain, unspecified: Secondary | ICD-10-CM

## 2012-10-07 DIAGNOSIS — H9190 Unspecified hearing loss, unspecified ear: Secondary | ICD-10-CM | POA: Diagnosis present

## 2012-10-07 DIAGNOSIS — K589 Irritable bowel syndrome without diarrhea: Secondary | ICD-10-CM | POA: Diagnosis present

## 2012-10-07 DIAGNOSIS — D6489 Other specified anemias: Secondary | ICD-10-CM | POA: Diagnosis present

## 2012-10-07 DIAGNOSIS — E785 Hyperlipidemia, unspecified: Secondary | ICD-10-CM | POA: Diagnosis present

## 2012-10-07 DIAGNOSIS — I2510 Atherosclerotic heart disease of native coronary artery without angina pectoris: Secondary | ICD-10-CM

## 2012-10-07 HISTORY — DX: Peripheral vascular disease, unspecified: I73.9

## 2012-10-07 HISTORY — DX: Heart failure, unspecified: I50.9

## 2012-10-07 HISTORY — DX: Gastro-esophageal reflux disease without esophagitis: K21.9

## 2012-10-07 LAB — CBC
MCH: 28.4 pg (ref 26.0–34.0)
MCH: 28 pg (ref 26.0–34.0)
MCV: 88.5 fL (ref 78.0–100.0)
MCV: 87.9 fL (ref 78.0–100.0)
Platelets: 124 10*3/uL — ABNORMAL LOW (ref 150–400)
Platelets: 135 10*3/uL — ABNORMAL LOW (ref 150–400)
RBC: 2.64 MIL/uL — ABNORMAL LOW (ref 3.87–5.11)
RDW: 13.2 % (ref 11.5–15.5)

## 2012-10-07 LAB — POCT I-STAT, CHEM 8
Calcium, Ion: 1.11 mmol/L — ABNORMAL LOW (ref 1.13–1.30)
Glucose, Bld: 189 mg/dL — ABNORMAL HIGH (ref 70–99)
HCT: 22 % — ABNORMAL LOW (ref 36.0–46.0)
Hemoglobin: 7.5 g/dL — ABNORMAL LOW (ref 12.0–15.0)

## 2012-10-07 LAB — BASIC METABOLIC PANEL
CO2: 26 mEq/L (ref 19–32)
Calcium: 8.5 mg/dL (ref 8.4–10.5)
Creatinine, Ser: 1.33 mg/dL — ABNORMAL HIGH (ref 0.50–1.10)
Glucose, Bld: 138 mg/dL — ABNORMAL HIGH (ref 70–99)
Sodium: 134 mEq/L — ABNORMAL LOW (ref 135–145)

## 2012-10-07 LAB — HEMOGLOBIN AND HEMATOCRIT, BLOOD: HCT: 27.4 % — ABNORMAL LOW (ref 36.0–46.0)

## 2012-10-07 LAB — COMPREHENSIVE METABOLIC PANEL
AST: 19 U/L (ref 0–37)
Albumin: 3.2 g/dL — ABNORMAL LOW (ref 3.5–5.2)
CO2: 25 mEq/L (ref 19–32)
Calcium: 8.2 mg/dL — ABNORMAL LOW (ref 8.4–10.5)
Creatinine, Ser: 1.27 mg/dL — ABNORMAL HIGH (ref 0.50–1.10)
GFR calc non Af Amer: 36 mL/min — ABNORMAL LOW (ref >90)

## 2012-10-07 LAB — MRSA PCR SCREENING: MRSA by PCR: NEGATIVE

## 2012-10-07 LAB — TSH: TSH: 1.849 u[IU]/mL (ref 0.350–4.500)

## 2012-10-07 LAB — TROPONIN I
Troponin I: <0.3 ng/mL (ref <0.30)
Troponin I: <0.3 ng/mL (ref <0.30)
Troponin I: 0.47 ng/mL (ref <0.30)

## 2012-10-07 LAB — PREPARE RBC (CROSSMATCH)

## 2012-10-07 LAB — POCT I-STAT TROPONIN I: Troponin i, poc: 0.02 ng/mL (ref 0.00–0.08)

## 2012-10-07 MED ORDER — LORAZEPAM 2 MG/ML IJ SOLN
0.5000 mg | INTRAMUSCULAR | Status: DC | PRN
  Administered 2012-10-07 – 2012-10-10 (×9): 0.5 mg via INTRAVENOUS
  Filled 2012-10-07 (×8): qty 1

## 2012-10-07 MED ORDER — MORPHINE SULFATE 2 MG/ML IJ SOLN
2.0000 mg | Freq: Once | INTRAMUSCULAR | Status: AC
  Filled 2012-10-07: qty 1

## 2012-10-07 MED ORDER — FUROSEMIDE 10 MG/ML IJ SOLN
20.0000 mg | Freq: Once | INTRAMUSCULAR | Status: AC
  Administered 2012-10-07: 20 mg via INTRAVENOUS
  Filled 2012-10-07: qty 2

## 2012-10-07 MED ORDER — FUROSEMIDE 10 MG/ML IJ SOLN
INTRAMUSCULAR | Status: AC
  Administered 2012-10-07: 80 mg via INTRAVENOUS
  Filled 2012-10-07: qty 8

## 2012-10-07 MED ORDER — SODIUM CHLORIDE 0.9 % IV SOLN
INTRAVENOUS | Status: DC
  Administered 2012-10-07: 04:00:00 via INTRAVENOUS

## 2012-10-07 MED ORDER — BUMETANIDE 0.25 MG/ML IJ SOLN
2.0000 mg | Freq: Once | INTRAMUSCULAR | Status: AC
  Administered 2012-10-07: 2 mg via INTRAVENOUS
  Filled 2012-10-07: qty 8

## 2012-10-07 MED ORDER — ENOXAPARIN SODIUM 150 MG/ML ~~LOC~~ SOLN: 1.0000 mg/kg | Freq: Two times a day (BID) | SUBCUTANEOUS | Status: DC

## 2012-10-07 MED ORDER — ALBUTEROL SULFATE (5 MG/ML) 0.5% IN NEBU
INHALATION_SOLUTION | RESPIRATORY_TRACT | Status: AC
  Administered 2012-10-07: 2.5 mg
  Filled 2012-10-07: qty 0.5

## 2012-10-07 MED ORDER — ACETAMINOPHEN 325 MG PO TABS: 650.0000 mg | ORAL_TABLET | Freq: Four times a day (QID) | ORAL | Status: DC | PRN

## 2012-10-07 MED ORDER — IPRATROPIUM BROMIDE 0.02 % IN SOLN
RESPIRATORY_TRACT | Status: AC
  Administered 2012-10-07: 0.5 mg
  Filled 2012-10-07: qty 2.5

## 2012-10-07 MED ORDER — SODIUM CHLORIDE 0.9 % IJ SOLN
3.0000 mL | Freq: Two times a day (BID) | INTRAMUSCULAR | Status: DC
  Administered 2012-10-07 – 2012-10-13 (×11): 3 mL via INTRAVENOUS

## 2012-10-07 MED ORDER — SODIUM CHLORIDE 0.9 % IV SOLN: 250.0000 mL | INTRAVENOUS | Status: DC | PRN

## 2012-10-07 MED ORDER — LORAZEPAM 0.5 MG PO TABS
0.5000 mg | ORAL_TABLET | Freq: Two times a day (BID) | ORAL | Status: DC
  Administered 2012-10-07: 0.5 mg via ORAL
  Filled 2012-10-07: qty 1

## 2012-10-07 MED ORDER — ONDANSETRON HCL 4 MG PO TABS: 4.0000 mg | ORAL_TABLET | Freq: Four times a day (QID) | ORAL | Status: DC | PRN

## 2012-10-07 MED ORDER — NITROGLYCERIN 0.4 MG SL SUBL: 0.4000 mg | SUBLINGUAL_TABLET | SUBLINGUAL | Status: DC | PRN

## 2012-10-07 MED ORDER — FUROSEMIDE 40 MG PO TABS
40.0000 mg | ORAL_TABLET | Freq: Every day | ORAL | Status: DC
  Filled 2012-10-07: qty 1

## 2012-10-07 MED ORDER — IPRATROPIUM BROMIDE 0.02 % IN SOLN
0.5000 mg | Freq: Four times a day (QID) | RESPIRATORY_TRACT | Status: DC
  Administered 2012-10-07 – 2012-10-08 (×6): 0.5 mg via RESPIRATORY_TRACT
  Filled 2012-10-07 (×6): qty 2.5

## 2012-10-07 MED ORDER — ZOLPIDEM TARTRATE 5 MG PO TABS: 5.0000 mg | ORAL_TABLET | Freq: Every evening | ORAL | Status: DC | PRN

## 2012-10-07 MED ORDER — ONDANSETRON HCL 4 MG/2ML IJ SOLN: 4.0000 mg | Freq: Four times a day (QID) | INTRAMUSCULAR | Status: DC | PRN

## 2012-10-07 MED ORDER — SODIUM CHLORIDE 0.9 % IJ SOLN
3.0000 mL | INTRAMUSCULAR | Status: DC | PRN
  Administered 2012-10-10: 3 mL via INTRAVENOUS

## 2012-10-07 MED ORDER — AMLODIPINE BESYLATE 10 MG PO TABS
10.0000 mg | ORAL_TABLET | Freq: Every day | ORAL | Status: DC
  Filled 2012-10-07: qty 1

## 2012-10-07 MED ORDER — ROPINIROLE HCL 0.5 MG PO TABS
0.5000 mg | ORAL_TABLET | Freq: Every day | ORAL | Status: DC
  Filled 2012-10-07: qty 1

## 2012-10-07 MED ORDER — FUROSEMIDE 10 MG/ML IJ SOLN: 80.0000 mg | Freq: Once | INTRAMUSCULAR | Status: AC

## 2012-10-07 MED ORDER — PANTOPRAZOLE SODIUM 40 MG IV SOLR
40.0000 mg | Freq: Two times a day (BID) | INTRAVENOUS | Status: DC
  Administered 2012-10-07 – 2012-10-09 (×6): 40 mg via INTRAVENOUS
  Filled 2012-10-07 (×8): qty 40

## 2012-10-07 MED ORDER — EZETIMIBE 10 MG PO TABS
10.0000 mg | ORAL_TABLET | Freq: Every day | ORAL | Status: DC
  Filled 2012-10-07: qty 1

## 2012-10-07 MED ORDER — ALBUTEROL SULFATE (5 MG/ML) 0.5% IN NEBU: 2.5000 mg | INHALATION_SOLUTION | RESPIRATORY_TRACT | Status: DC | PRN

## 2012-10-07 MED ORDER — MORPHINE SULFATE 2 MG/ML IJ SOLN
2.0000 mg | INTRAMUSCULAR | Status: DC | PRN
  Administered 2012-10-07: 2 mg via INTRAVENOUS
  Filled 2012-10-07: qty 1

## 2012-10-07 MED ORDER — METOPROLOL SUCCINATE ER 100 MG PO TB24
100.0000 mg | ORAL_TABLET | Freq: Every day | ORAL | Status: DC
  Filled 2012-10-07: qty 1

## 2012-10-07 MED ORDER — TERAZOSIN HCL 1 MG PO CAPS
1.0000 mg | ORAL_CAPSULE | Freq: Every day | ORAL | Status: DC
Start: 2012-10-07 — End: 2012-10-07
  Filled 2012-10-07: qty 1

## 2012-10-07 MED ORDER — HALOPERIDOL LACTATE 5 MG/ML IJ SOLN
5.0000 mg | INTRAMUSCULAR | Status: DC | PRN
  Administered 2012-10-07: 5 mg via INTRAVENOUS
  Filled 2012-10-07: qty 1

## 2012-10-07 MED ORDER — ALUM & MAG HYDROXIDE-SIMETH 200-200-20 MG/5ML PO SUSP
30.0000 mL | Freq: Four times a day (QID) | ORAL | Status: DC | PRN
  Filled 2012-10-07: qty 30

## 2012-10-07 MED ORDER — POTASSIUM CHLORIDE CRYS ER 20 MEQ PO TBCR: 20.0000 meq | EXTENDED_RELEASE_TABLET | Freq: Two times a day (BID) | ORAL | Status: DC

## 2012-10-07 MED ORDER — FUROSEMIDE 10 MG/ML IJ SOLN
40.0000 mg | Freq: Two times a day (BID) | INTRAMUSCULAR | Status: DC
  Administered 2012-10-07: 40 mg via INTRAVENOUS
  Filled 2012-10-07: qty 4

## 2012-10-07 MED ORDER — ALBUTEROL SULFATE (5 MG/ML) 0.5% IN NEBU
2.5000 mg | INHALATION_SOLUTION | Freq: Four times a day (QID) | RESPIRATORY_TRACT | Status: DC
  Administered 2012-10-07 – 2012-10-08 (×6): 2.5 mg via RESPIRATORY_TRACT
  Filled 2012-10-07 (×6): qty 0.5

## 2012-10-07 MED ORDER — ACETAMINOPHEN 650 MG RE SUPP: 650.0000 mg | Freq: Four times a day (QID) | RECTAL | Status: DC | PRN

## 2012-10-07 MED ORDER — IPRATROPIUM BROMIDE 0.02 % IN SOLN: 0.5000 mg | RESPIRATORY_TRACT | Status: DC | PRN

## 2012-10-07 MED ORDER — CHLORHEXIDINE GLUCONATE 0.12 % MT SOLN
15.0000 mL | Freq: Two times a day (BID) | OROMUCOSAL | Status: DC
  Administered 2012-10-08 – 2012-10-13 (×11): 15 mL via OROMUCOSAL
  Filled 2012-10-07 (×13): qty 15

## 2012-10-07 MED ORDER — SERTRALINE HCL 50 MG PO TABS
50.0000 mg | ORAL_TABLET | Freq: Every day | ORAL | Status: DC
  Administered 2012-10-07 – 2012-10-13 (×7): 50 mg via ORAL
  Filled 2012-10-07 (×8): qty 1

## 2012-10-07 MED ORDER — METOPROLOL TARTRATE 1 MG/ML IV SOLN
2.5000 mg | INTRAVENOUS | Status: DC | PRN
  Filled 2012-10-07: qty 5

## 2012-10-07 MED ORDER — MORPHINE SULFATE 2 MG/ML IJ SOLN: 2.0000 mg | Freq: Once | INTRAMUSCULAR | Status: DC

## 2012-10-07 MED ORDER — MORPHINE SULFATE 2 MG/ML IJ SOLN
INTRAMUSCULAR | Status: AC
  Administered 2012-10-07: 2 mg via INTRAVENOUS
  Filled 2012-10-07: qty 1

## 2012-10-07 MED ORDER — OXYCODONE HCL 5 MG PO TABS: 5.0000 mg | ORAL_TABLET | ORAL | Status: DC | PRN

## 2012-10-07 MED ORDER — HYDROMORPHONE HCL PF 1 MG/ML IJ SOLN
0.5000 mg | INTRAMUSCULAR | Status: DC | PRN
  Administered 2012-10-08 (×3): 0.5 mg via INTRAVENOUS
  Filled 2012-10-07 (×2): qty 1

## 2012-10-07 NOTE — H&P (Signed)
[facility] History and Physical  Darlene [name] WUJ:[id number] DOB: 10-10-1920 DOA: 10/07/2012  Referring physician:  PCP: [name], MD  Specialists:   Chief Complaint:   HPI: Darlene [name] is a 79 y.o. female who presents to the ED with complaints of recurrence of rectal bleeding of BRBPR, of which has been painless since the AM, and intermittent chest pain across her chest associated with SOB for the past 2 days.   She was evaluated in the ED and was found to have a hemoglobin of 7.5, and her initial troponin has been negative. She was referred for medical admission   Review of Systems: The patient denies anorexia, fever, chills, headaches, weight loss,, vision loss, diplopia, dizziness, decreased hearing, rhinitis, hoarseness, chest pain, syncope, peripheral edema, balance deficits, cough, hemoptysis, abdominal pain, nausea, vomiting, diarrhea, constipation, hematemesis, melena, severe indigestion/heartburn, dysuria, hematuria, incontinence,  suspicious skin lesions, transient blindness, difficulty walking, depression, unusual weight change, abnormal bleeding, enlarged lymph nodes, angioedema, and breast masses.    Past Medical History  Diagnosis Date  . UTI (urinary tract infection)   . IBS (irritable bowel syndrome)   . Abdominal pain, left lower quadrant   . CAD (coronary artery disease)   . Pneumonia   . HTN (hypertension)   . Hyperlipidemia   . Diverticulosis of colon   . History of colon cancer   . Allergic rhinitis   . Angina   . Cancer   . Arthritis   . Shortness of breath   . Anemia   . Blood transfusion   . Anxiety     Past Surgical History  Procedure Laterality Date  . Hemicolectomy  1991    Right for mgt of colon cancer.  . Angioplasty  1989    Stent x 2  . Appendectomy    . Bilateral salpingoophorectomy    . Breast surgery      benign  . Orif hip fracture  12/2009    Dr Charlann Boxer  . Esophagogastroduodenoscopy  08/09/2011    Procedure:  ESOPHAGOGASTRODUODENOSCOPY (EGD);  Surgeon: Hart Carwin, MD;  Location: [facility] ENDOSCOPY;  Service: Endoscopy;  Laterality: N/A;  . Colonoscopy  10/24/2011    Procedure: COLONOSCOPY;  Surgeon: Beverley Fiedler, MD;  Location: [facility] ENDOSCOPY;  Service: Gastroenterology;  Laterality: N/A;    Prior to Admission medications   Medication Sig Start Date End Date Taking? Authorizing Provider  amLODipine (NORVASC) 10 MG tablet Take 1 tablet (10 mg total) by mouth daily. 07/19/12  Yes Jacques Navy, MD  aspirin 81 MG chewable tablet Chew 1 tablet (81 mg total) by mouth daily. 07/19/12  Yes Jacques Navy, MD  esomeprazole (NEXIUM) 40 MG capsule Take 1 capsule (40 mg total) by mouth daily. 07/19/12  Yes Jacques Navy, MD  ezetimibe (ZETIA) 10 MG tablet Take 1 tablet (10 mg total) by mouth daily. 04/30/12  Yes Jacques Navy, MD  furosemide (LASIX) 40 MG tablet Take 1 tablet (40 mg total) by mouth daily. 07/19/12  Yes Jacques Navy, MD  HYDROcodone-acetaminophen (NORCO/VICODIN) 5-325 MG per tablet TAKE 1 TABLET TWICE DAILY AS NEEDED FOR PAIN. 09/27/12  Yes Corwin Levins, MD  LORazepam (ATIVAN) 0.5 MG tablet TAKE 1 TABLET BY MOUTH TWICE DAILY 09/18/12  Yes Jacques Navy, MD  metoprolol succinate (TOPROL-XL) 100 MG 24 hr tablet Take 1 tablet (100 mg total) by mouth daily. 07/19/12  Yes Jacques Navy, MD  Multiple Vitamins-Minerals (CENTRUM SILVER) tablet Take 1 tablet by mouth  daily. 07/19/12  Yes Jacques Navy, MD  Multiple Vitamins-Minerals (MULTIVITAMIN,TX-MINERALS) tablet Take 1 tablet by mouth daily.     Yes Historical Provider, MD  nitroGLYCERIN (NITROSTAT) 0.4 MG SL tablet Place 1 tablet (0.4 mg total) under the tongue every 5 (five) minutes as needed. For chest pain 07/19/12  Yes Jacques Navy, MD  pantoprazole (PROTONIX) 40 MG tablet Take 1 tablet (40 mg total) by mouth daily. 07/19/12  Yes Jacques Navy, MD  pravastatin (PRAVACHOL) 80 MG tablet Take 1 tablet (80 mg total) by mouth daily.  07/19/12  Yes Jacques Navy, MD  ramipril (ALTACE) 10 MG tablet Take 1 tablet (10 mg total) by mouth daily. 07/19/12  Yes Jacques Navy, MD  rOPINIRole (REQUIP) 0.5 MG tablet Take 1 tablet (0.5 mg total) by mouth at bedtime. 07/17/12  Yes Jacques Navy, MD  sertraline (ZOLOFT) 50 MG tablet Take 1 tablet (50 mg total) by mouth daily. 07/19/12  Yes Jacques Navy, MD  terazosin (HYTRIN) 1 MG capsule Take 1 capsule (1 mg total) by mouth at bedtime. 07/19/12  Yes Jacques Navy, MD  triamcinolone cream (KENALOG) 0.1 % Apply 1 application topically 2 (two) times daily as needed (itching). 07/19/12  Yes Jacques Navy, MD    Allergies  Allergen Reactions  . Antihistamines, Chlorpheniramine-Type Other (See Comments)    unknown  . Chocolate Other (See Comments)    Pt states "makes her sick" and she starts coughing    Social History:  reports that she has never smoked. She does not have any smokeless tobacco history on file. She reports that she does not drink alcohol or use illicit drugs.     Family History  Problem Relation Age of Onset  . Colon cancer Neg Hx   . Breast cancer Neg Hx       Physical Exam:  GEN:  Pleasant Elderly Obese  79 y.o. Caucasian female  examined  and in no acute distress; cooperative with exam Filed Vitals:   10/07/12 0135 10/07/12 0200 10/07/12 0230 10/07/12 0245  BP: 111/94 111/53 107/58 120/54  Pulse: 71 129 66 69  Temp:      TempSrc:      Resp: 18 20 18 22   SpO2: 93% 90% 95% 92%   Blood pressure 120/54, pulse 69, temperature 99.8 F (37.7 C), temperature source Oral, resp. rate 22, SpO2 92.00%. PSYCH: She is alert and oriented x4; does not appear anxious does not appear depressed; affect is normal HEENT: Normocephalic and Atraumatic, Mucous membranes pink; PERRLA; EOM intact; Fundi:  Benign;  No scleral icterus, Nares: Patent, Oropharynx: Clear, Edentulous;  Neck:  FROM, no cervical lymphadenopathy nor thyromegaly or carotid bruit; no  JVD; Breasts:: Not examined CHEST WALL: No tenderness CHEST: Normal respiration, clear to auscultation bilaterally HEART: Regular rate and rhythm; no murmurs rubs or gallops BACK: No kyphosis or scoliosis; no CVA tenderness ABDOMEN: Positive Bowel Sounds, Obese, soft non-tender; no masses, no organomegaly. Rectal Exam:  Done by EDP, FOBT:   HEME + EXTREMITIES: No cyanosis, clubbing or edema; no ulcerations. Genitalia: not examined PULSES: 2+ and symmetric SKIN: Normal hydration no rash or ulceration CNS: Cranial nerves 2-12 grossly intact no focal neurologic deficit    Labs on Admission:  Basic Metabolic Panel:  Recent Labs Lab 10/07/12 0041 10/07/12 0053  NA 133* 136  K 3.4* 3.6  CL 98 99  CO2 25  --   GLUCOSE 191* 189*  BUN 21 22  CREATININE 1.27* 1.20*  CALCIUM 8.2*  --    Liver Function Tests:  Recent Labs Lab 10/07/12 0041  AST 19  ALT 9  ALKPHOS 81  BILITOT 0.5  PROT 6.1  ALBUMIN 3.2*   No results found for this basename: LIPASE, AMYLASE,  in the last 168 hours No results found for this basename: AMMONIA,  in the last 168 hours CBC:  Recent Labs Lab 10/07/12 0041 10/07/12 0053  WBC 9.0  --   HGB 7.4* 7.5*  HCT 23.1* 22.0*  MCV 88.5  --   PLT 124*  --    Cardiac Enzymes: No results found for this basename: CKTOTAL, CKMB, CKMBINDEX, TROPONINI,  in the last 168 hours  BNP (last 3 results)  Recent Labs  10/19/11 0213 10/20/11 0515 05/23/12 1733  PROBNP 2057.0* 5184.0* 1460.0*   CBG: No results found for this basename: GLUCAP,  in the last 168 hours  Radiological Exams on Admission: Dg Chest Portable 1 View  10/07/2012   *RADIOLOGY REPORT*  Clinical Data: Chest pain.  Altered consciousness.  Coronary artery disease.  PORTABLE CHEST - 1 VIEW  Comparison: 05/25/2012  Findings: Midline trachea.  Mild cardiomegaly with atherosclerosis in the transverse aorta.  Small bilateral pleural effusions. No pneumothorax.  Mild lower lobe predominant  interstitial edema. Patchy bibasilar airspace disease.  IMPRESSION: Mild interstitial edema with small bilateral pleural effusions.  Bibasilar airspace disease, likely atelectasis.   Original Report Authenticated By: Jeronimo Greaves, M.D.     EKG: Independently reviewed. Normal Sinus rhythm, with Ischemic Changes in Inferior leads.     Assessment/Plan Principal Problem:   GI (gastrointestinal bleed) Active Problems:   Anemia   HYPERTENSION   CORONARY ARTERY DISEASE   Chest Pain   Pulmonary Edema    1.  GI Bleed(Hematochezia)-  IV Protonix ordered, and Monitor H/H levels q 8 hours, Transfuse 1 unit PRBCs and monitor trend of Hb.  May need GI consultation.    2.   Anemia-  Due to #1, Monitor H/H levels.     3.   Chest Pain- from Ischemia due to #2, cycle Troponins.    4.   CAD - history see #3.    5.   Pulmonary Edema - CHF volume overload, CHF protocol ordered to diurese., along with supplemental O2.    6.  HTN-  Continue Amlodipine, Metoprolol, and ramipril, but hold if SBP < 115.    7.  SCDs for DVT prophylaxis.       Code Status:   FULL CODE Family Communication:    No Family Present Disposition Plan:       Return to Home  Time spent:  42 Minutes  Ron Parker [facility] Pager [phone]  If 7PM-7AM, please contact night-coverage [email or web address] Password [facility] 10/07/2012, 3:25 AM

## 2012-10-07 NOTE — Progress Notes (Signed)
Courtesy note: patient well known to me. She has had multiple admissions for LGI bleed with anemia and anemia related angina. At her last admission she had NSTEMI due to anemia. Dr. Jacinto Halim does not feel she is a candidate for cath or intervention. Her cardiac symptoms do respond to correction of her anemia - usually transfuse to Hgb of 9 or greater. She has been unwilling to have colonoscopy due to a fear of recurrent colon cancer. At her age - I have agreed that colonoscopy is not helpful.   We have discussed code status on several occasions - she has never been ready to make the decision but states she does not want to be a vegtable.  Happy to assume attending responsibility at any point.

## 2012-10-07 NOTE — Progress Notes (Signed)
Pt remains on NRM. Sats 84-90% Pt coughing productively. Expectorating mod amts thick yellow mucus. Very anxious and aggitated. Pulling mask off. Elink in to monitor pt. Dr Vassie Loll updated on poor response to Lasix so far. Monitor shows more prominent ST depression. EKG obtained. Pharmacist notified to send Haldol stat. Elray Mcgregor NP also at bedside. 0600 Haldol 5mg  administered IV as ordered. Attempted insertion of second access without success due to restlessness. Dr. Silverio Decamp in to see and evaluate pt.

## 2012-10-07 NOTE — Progress Notes (Signed)
Subjective/Objective Worsening dyspnea, hypoxia requiring NRB  Physical Examination: General appearance - pale, anxious, moderate distress Chest - wheezing bilaterally, tachypneic, oxygen saturation as low as 82%. Heart - normal rate and regular rhythm Extremities - cool + 1 pedal pulses   Scheduled Meds: . amLODipine  10 mg Oral Daily  . ezetimibe  10 mg Oral Daily  . furosemide  40 mg Intravenous Q12H  . furosemide  40 mg Oral Daily  . LORazepam  0.5 mg Oral BID  . metoprolol succinate  100 mg Oral Daily  . pantoprazole (PROTONIX) IV  40 mg Intravenous Q12H  . potassium chloride  20 mEq Oral BID  . rOPINIRole  0.5 mg Oral QHS  . sertraline  50 mg Oral Daily  . sodium chloride  3 mL Intravenous Q12H  . terazosin  1 mg Oral QHS   Continuous Infusions: . sodium chloride 50 mL/hr at 10/07/12 0400   PRN Meds:sodium chloride, acetaminophen, acetaminophen, alum & mag hydroxide-simeth, haloperidol lactate, HYDROmorphone (DILAUDID) injection, nitroGLYCERIN, ondansetron (ZOFRAN) IV, ondansetron, oxyCODONE, sodium chloride, zolpidem  Vital signs in last 24 hours: Temp:  [98.6 F (37 C)-99.8 F (37.7 C)] 98.6 F (37 C) (06/29 0355) Pulse Rate:  [66-129] 69 (06/29 0410) Resp:  [17-26] 17 (06/29 0410) BP: (107-120)/(44-94) 111/46 mmHg (06/29 0410) SpO2:  [90 %-96 %] 95 % (06/29 0410) Weight:  [55 kg (121 lb 4.1 oz)] 55 kg (121 lb 4.1 oz) (06/29 0355)  Intake/Output last 3 shifts:   Intake/Output this shift:    Problem Assessment/Plan  Acute respiratory failure: PCCM consulted. Lasix IV given for interstitial edema seen on cxray/BNP 5943. Oxygen saturation improved after decreasing anxiety with Haldol. Remains on NRB. Transfer to ICU. May require intubation.

## 2012-10-07 NOTE — Progress Notes (Signed)
Pt transferred to 2309 for critical care interventions and monitoring. Son called. Updated on pt's status and transfer to 2300.Morhine 2mg  IV administered prior to transfer.

## 2012-10-07 NOTE — Consult Note (Signed)
PULMONARY  / CRITICAL CARE MEDICINE Consult  Name: Darlene [name] MRN: [id number] DOB: 1920-07-24    ADMISSION DATE:  10/07/2012 CONSULTATION DATE:  10/07/2012  REFERRING MD :  Dr. Sharon Seller PRIMARY SERVICE: Internal Medicine  CHIEF COMPLAINT:  Dyspnea  BRIEF PATIENT DESCRIPTION: 62F with BRBPR and acute drop of Hb, also has CHF and worsening dyspnea + pulmonary edema, some ST depression lateral leads.  SIGNIFICANT EVENTS / STUDIES:  CXR shows pulmonary edema, EKG shows lateral ST depression. Wheezing and agitated, got haldol + morphine + duoneb  LINES / TUBES: Foley 6/29 >>>  CULTURES: None  ANTIBIOTICS: None  HISTORY OF PRESENT ILLNESS:  This is an 7 caucasian female, had painless BRBPR since yesterday AM, also had chest pain mild to moderate chest pain in the past 2 days. Her Hb was found to drop to 7.5 (baseline around 10). CXR shows pulmonary edema. EKG shows ST depression lateral leads. Known to have NSTEMI several months ago. Previous LVEF 35%. Today found to also be wheezing, agitated, complaining of some leg pain. Got haldol + morphine + duoneb that somehow made her a little better. Also had gotten total 60 mg lasix IV since midnight.  Sheis a nonsmoker, not known to have COPD.  PAST MEDICAL HISTORY :  Past Medical History  Diagnosis Date  . UTI (urinary tract infection)   . IBS (irritable bowel syndrome)   . Abdominal pain, left lower quadrant   . CAD (coronary artery disease)   . Pneumonia   . HTN (hypertension)   . Hyperlipidemia   . Diverticulosis of colon   . History of colon cancer   . Allergic rhinitis   . Angina   . Cancer   . Arthritis   . Shortness of breath   . Anemia   . Blood transfusion   . Anxiety   . CHF (congestive heart failure)   . GERD (gastroesophageal reflux disease)   . Peripheral vascular disease    Past Surgical History  Procedure Laterality Date  . Hemicolectomy  1991    Right for mgt of colon cancer.  . Angioplasty  1989     Stent x 2  . Appendectomy    . Bilateral salpingoophorectomy    . Breast surgery      benign  . Orif hip fracture  12/2009    Dr Charlann Boxer  . Esophagogastroduodenoscopy  08/09/2011    Procedure: ESOPHAGOGASTRODUODENOSCOPY (EGD);  Surgeon: Hart Carwin, MD;  Location: [facility] ENDOSCOPY;  Service: Endoscopy;  Laterality: N/A;  . Colonoscopy  10/24/2011    Procedure: COLONOSCOPY;  Surgeon: Beverley Fiedler, MD;  Location: [facility] ENDOSCOPY;  Service: Gastroenterology;  Laterality: N/A;  . Colon surgery     Prior to Admission medications   Medication Sig Start Date End Date Taking? Authorizing Provider  amLODipine (NORVASC) 10 MG tablet Take 1 tablet (10 mg total) by mouth daily. 07/19/12  Yes Jacques Navy, MD  aspirin 81 MG chewable tablet Chew 1 tablet (81 mg total) by mouth daily. 07/19/12  Yes Jacques Navy, MD  esomeprazole (NEXIUM) 40 MG capsule Take 1 capsule (40 mg total) by mouth daily. 07/19/12  Yes Jacques Navy, MD  ezetimibe (ZETIA) 10 MG tablet Take 1 tablet (10 mg total) by mouth daily. 04/30/12  Yes Jacques Navy, MD  furosemide (LASIX) 40 MG tablet Take 1 tablet (40 mg total) by mouth daily. 07/19/12  Yes Jacques Navy, MD  HYDROcodone-acetaminophen (NORCO/VICODIN) 5-325 MG per tablet TAKE 1 TABLET  TWICE DAILY AS NEEDED FOR PAIN. 09/27/12  Yes Corwin Levins, MD  LORazepam (ATIVAN) 0.5 MG tablet TAKE 1 TABLET BY MOUTH TWICE DAILY 09/18/12  Yes Jacques Navy, MD  metoprolol succinate (TOPROL-XL) 100 MG 24 hr tablet Take 1 tablet (100 mg total) by mouth daily. 07/19/12  Yes Jacques Navy, MD  Multiple Vitamins-Minerals (CENTRUM SILVER) tablet Take 1 tablet by mouth daily. 07/19/12  Yes Jacques Navy, MD  Multiple Vitamins-Minerals (MULTIVITAMIN,TX-MINERALS) tablet Take 1 tablet by mouth daily.     Yes Historical Provider, MD  nitroGLYCERIN (NITROSTAT) 0.4 MG SL tablet Place 1 tablet (0.4 mg total) under the tongue every 5 (five) minutes as needed. For chest pain 07/19/12  Yes Jacques Navy, MD  pantoprazole (PROTONIX) 40 MG tablet Take 1 tablet (40 mg total) by mouth daily. 07/19/12  Yes Jacques Navy, MD  pravastatin (PRAVACHOL) 80 MG tablet Take 1 tablet (80 mg total) by mouth daily. 07/19/12  Yes Jacques Navy, MD  ramipril (ALTACE) 10 MG tablet Take 1 tablet (10 mg total) by mouth daily. 07/19/12  Yes Jacques Navy, MD  rOPINIRole (REQUIP) 0.5 MG tablet Take 1 tablet (0.5 mg total) by mouth at bedtime. 07/17/12  Yes Jacques Navy, MD  sertraline (ZOLOFT) 50 MG tablet Take 1 tablet (50 mg total) by mouth daily. 07/19/12  Yes Jacques Navy, MD  terazosin (HYTRIN) 1 MG capsule Take 1 capsule (1 mg total) by mouth at bedtime. 07/19/12  Yes Jacques Navy, MD  triamcinolone cream (KENALOG) 0.1 % Apply 1 application topically 2 (two) times daily as needed (itching). 07/19/12  Yes Jacques Navy, MD   Allergies  Allergen Reactions  . Antihistamines, Chlorpheniramine-Type Other (See Comments)    unknown  . Chocolate Other (See Comments)    Pt states "makes her sick" and she starts coughing    FAMILY HISTORY:  Family History  Problem Relation Age of Onset  . Colon cancer Neg Hx   . Breast cancer Neg Hx    SOCIAL HISTORY:  reports that she has never smoked. She does not have any smokeless tobacco history on file. She reports that she does not drink alcohol or use illicit drugs.  REVIEW OF SYSTEMS:  Denies abd pain, fever, chills, HA. Somewhat difficult to obtain RoS given the current situation  SUBJECTIVE:   VITAL SIGNS: Temp:  [98.6 F (37 C)-99.8 F (37.7 C)] 98.6 F (37 C) (06/29 0355) Pulse Rate:  [66-129] 91 (06/29 0600) Resp:  [17-27] 21 (06/29 0600) BP: (107-130)/(44-94) 130/51 mmHg (06/29 0600) SpO2:  [77 %-96 %] 88 % (06/29 0622) Weight:  [55 kg (121 lb 4.1 oz)] 55 kg (121 lb 4.1 oz) (06/29 0355) HEMODYNAMICS:   VENTILATOR SETTINGS:   INTAKE / OUTPUT: Intake/Output   None     PHYSICAL EXAMINATION: General:  Seems very anxious and  restless, occasionally trying to remove her NRB mask Neuro:  Awake, alert, oriented to person, no focal deficit, move all extremities HEENT:  Atraumatic, trachea midline, no stridor Cardiovascular:  RRR tachycardic, no heave Lungs:  Generalized bilateral expiratory wheeze, mild inspiratory rales on both bases Abdomen:  Soft, no tenderness, no guarding Musculoskeletal:  Mild edema, good capillary refill Skin:  Several ecchymoses  LABS:  Recent Labs Lab 10/07/12 0041 10/07/12 0053 10/07/12 0511  HGB 7.4* 7.5* 7.4*  WBC 9.0  --  9.2  PLT 124*  --  135*  NA 133* 136 134*  K 3.4* 3.6 4.0  CL 98 99 98  CO2 25  --  26  GLUCOSE 191* 189* 138*  BUN 21 22 22   CREATININE 1.27* 1.20* 1.33*  CALCIUM 8.2*  --  8.5  AST 19  --   --   ALT 9  --   --   ALKPHOS 81  --   --   BILITOT 0.5  --   --   PROT 6.1  --   --   ALBUMIN 3.2*  --   --   TROPONINI  --   --  <0.30  PROBNP  --   --  5943.0*   No results found for this basename: GLUCAP,  in the last 168 hours  CXR:  Midline trachea. Mild cardiomegaly with atherosclerosis in the transverse aorta. Small bilateral pleural effusions. No pneumothorax. Mild lower lobe predominant interstitial edema. Patchy bibasilar airspace disease.   ASSESSMENT / PLAN: 1. Pulmonary edema 2. Moderate to high likelihood of acute coronary event 3. BRBPR + Anemia 4. Possible delirium component  Plans & recommendations: -Continue Lasix and morphine -will hold transfusion for now given the pulmonary edema + hypoxia -Continue trending troponin. Cardio consult. Will be difficult to manage if this is true ACS given that she has GI bleed. -GI consult -Haldol PRN, duoneb PRN -Patient is low threshold for intubation -Protonix IV  TODAY'S SUMMARY: admitted for BRBPR, anemia, CXR showed pulmonary edema, EKG ST depression lateral leads, hypoxic and wheezing somewhat improved after haldol + morphine + duoneb. Got Lasix 60mg  IV total.  I have personally obtained  a history, examined the patient, evaluated laboratory and imaging results, formulated the assessment and plan and placed orders. CRITICAL CARE: The patient is critically ill with multiple organ systems failure and requires high complexity decision making for assessment and support, frequent evaluation and titration of therapies, application of advanced monitoring technologies and extensive interpretation of multiple databases. Critical Care Time devoted to patient care services described in this note is 30 minutes.    Pulmonary and Critical Care Medicine [facility] Pager: [phone]  10/07/2012, 6:33 AM

## 2012-10-07 NOTE — ED Notes (Signed)
Per EMS: Pt reports CP that has been going on all day. States the pain decreased throughout the day but got worse later. Pt had a NSTEMI in April. Pt took 2 NTG and 325mg  asa before EMS arrival. EMS administered 1 additional NTG. Pt currently Ax4, NAD. Pain initially 10/10 upon EMS arrival, currently 7/10. Pulse 75, BP 108/78, O2 94L. Two episodes of vomiting prior to EMS arrival.

## 2012-10-07 NOTE — Progress Notes (Signed)
eLink Physician-Brief Progress Note Patient Name: Darlene [name] DOB: Aug 04, 1920 MRN: [id number]  Date of Service  10/07/2012   HPI/Events of Note   Hypoxemic respiratory failure Pulmonary edema   eICU Interventions   BiPAP Morphine Bumex DNI according to RN per recent family conversation    Intervention Category Major Interventions: Respiratory failure - evaluation and management  Howie Rufus 10/07/2012, 3:47 PM

## 2012-10-07 NOTE — ED Provider Notes (Signed)
History    CSN: [id number] Arrival date & time 10/07/12  0013  First MD Initiated Contact with Patient 10/07/12 0121     Chief Complaint  Patient presents with  . Chest Pain   (Consider location/radiation/quality/duration/timing/severity/associated sxs/prior Treatment) HPI\ Hx per EMS and PT - CP for the last 1-2 days across her chest, denies any SOB. Recent NSTEMI. Was given ASA and NTG in route.  On arrival to the ED is a poor historian, denies any complaints at this time, has had persistent BRBPR. She denies any ABD pain, fevers, or rectal pain. She did have 2 episodes of emesis prior to EMS arrival.   Past Medical History  Diagnosis Date  . UTI (urinary tract infection)   . IBS (irritable bowel syndrome)   . Abdominal pain, left lower quadrant   . CAD (coronary artery disease)   . Pneumonia   . HTN (hypertension)   . Hyperlipidemia   . Diverticulosis of colon   . History of colon cancer   . Allergic rhinitis   . Angina   . Cancer   . Arthritis   . Shortness of breath   . Anemia   . Blood transfusion   . Anxiety    Past Surgical History  Procedure Laterality Date  . Hemicolectomy  1991    Right for mgt of colon cancer.  . Angioplasty  1989    Stent x 2  . Appendectomy    . Bilateral salpingoophorectomy    . Breast surgery      benign  . Orif hip fracture  12/2009    Dr Charlann Boxer  . Esophagogastroduodenoscopy  08/09/2011    Procedure: ESOPHAGOGASTRODUODENOSCOPY (EGD);  Surgeon: Hart Carwin, MD;  Location[facility] ENDOSCOPY;  Service: Endoscopy;  Laterality: N/A;  . Colonoscopy  10/24/2011    Procedure: COLONOSCOPY;  Surgeon: Beverley Fiedler, MD;  Location: [facility] ENDOSCOPY;  Service: Gastroenterology;  Laterality: N/A;   Family History  Problem Relation Age of Onset  . Colon cancer Neg Hx   . Breast cancer Neg Hx    History  Substance Use Topics  . Smoking status: Never Smoker   . Smokeless tobacco: Not on file  . Alcohol Use: No   OB History   Grav Para Term Preterm  Abortions TAB SAB Ect Mult Living                 Review of Systems  Constitutional: Negative for fever and chills.  HENT: Negative for neck pain and neck stiffness.   Eyes: Negative for visual disturbance.  Respiratory: Negative for shortness of breath.   Cardiovascular: Positive for chest pain.  Gastrointestinal: Positive for anal bleeding. Negative for abdominal pain.  Genitourinary: Negative for dysuria.  Musculoskeletal: Negative for back pain.  Skin: Negative for rash.  Neurological: Negative for headaches.  All other systems reviewed and are negative.    Allergies  Antihistamines, chlorpheniramine-type and Chocolate  Home Medications   Current Outpatient Rx  Name  Route  Sig  Dispense  Refill  . amLODipine (NORVASC) 10 MG tablet   Oral   Take 1 tablet (10 mg total) by mouth daily.   30 tablet   5   . aspirin 81 MG chewable tablet   Oral   Chew 1 tablet (81 mg total) by mouth daily.   30 tablet   5   . esomeprazole (NEXIUM) 40 MG capsule   Oral   Take 1 capsule (40 mg total) by mouth daily.  30 capsule   5   . ezetimibe (ZETIA) 10 MG tablet   Oral   Take 1 tablet (10 mg total) by mouth daily.   30 tablet   2   . furosemide (LASIX) 40 MG tablet   Oral   Take 1 tablet (40 mg total) by mouth daily.   30 tablet   5   . HYDROcodone-acetaminophen (NORCO/VICODIN) 5-325 MG per tablet      TAKE 1 TABLET TWICE DAILY AS NEEDED FOR PAIN.   60 tablet   0   . LORazepam (ATIVAN) 0.5 MG tablet      TAKE 1 TABLET BY MOUTH TWICE DAILY   60 tablet   0   . metoprolol succinate (TOPROL-XL) 100 MG 24 hr tablet   Oral   Take 1 tablet (100 mg total) by mouth daily.   30 tablet   5   . Multiple Vitamins-Minerals (CENTRUM SILVER) tablet   Oral   Take 1 tablet by mouth daily.   30 tablet   5   . Multiple Vitamins-Minerals (MULTIVITAMIN,TX-MINERALS) tablet   Oral   Take 1 tablet by mouth daily.           . nitroGLYCERIN (NITROSTAT) 0.4 MG SL tablet    Sublingual   Place 1 tablet (0.4 mg total) under the tongue every 5 (five) minutes as needed. For chest pain   30 tablet   5   . pantoprazole (PROTONIX) 40 MG tablet   Oral   Take 1 tablet (40 mg total) by mouth daily.   30 tablet   5   . pravastatin (PRAVACHOL) 80 MG tablet   Oral   Take 1 tablet (80 mg total) by mouth daily.   30 tablet   5   . ramipril (ALTACE) 10 MG tablet   Oral   Take 1 tablet (10 mg total) by mouth daily.   30 tablet   5   . rOPINIRole (REQUIP) 0.5 MG tablet   Oral   Take 1 tablet (0.5 mg total) by mouth at bedtime.   30 tablet   6   . sertraline (ZOLOFT) 50 MG tablet   Oral   Take 1 tablet (50 mg total) by mouth daily.   30 tablet   5   . terazosin (HYTRIN) 1 MG capsule   Oral   Take 1 capsule (1 mg total) by mouth at bedtime.   30 capsule   5   . triamcinolone cream (KENALOG) 0.1 %   Topical   Apply 1 application topically 2 (two) times daily as needed (itching).   30 g   5    BP 115/44  Pulse 71  Temp(Src) 99.8 F (37.7 C) (Oral)  Resp 25  SpO2 96% Physical Exam  Constitutional: She is oriented to person, place, and time. She appears well-developed and well-nourished.  HENT:  Head: Normocephalic and atraumatic.  Eyes: EOM are normal. Pupils are equal, round, and reactive to light.  Neck: Neck supple.  Cardiovascular: Normal rate, regular rhythm and intact distal pulses.   Pulmonary/Chest: Effort normal and breath sounds normal. No respiratory distress.  Abdominal: Soft. She exhibits no distension. There is no tenderness.  Genitourinary:  Rectal exam BRB, nontender, no fissure or mass  Musculoskeletal: Normal range of motion. She exhibits no edema.  Neurological: She is alert and oriented to person, place, and time.  Skin: Skin is warm and dry.    ED Course  Procedures (including critical care time)  Results for  orders placed during the hospital encounter of 10/07/12  CBC      Result Value Range   WBC 9.0  4.0 -  10.5 K/uL   RBC 2.61 (*) 3.87 - 5.11 MIL/uL   Hemoglobin 7.4 (*) 12.0 - 15.0 g/dL   HCT 16.1 (*) 09.6 - 04.5 %   MCV 88.5  78.0 - 100.0 fL   MCH 28.4  26.0 - 34.0 pg   MCHC 32.0  30.0 - 36.0 g/dL   RDW 40.9  81.1 - 91.4 %   Platelets 124 (*) 150 - 400 K/uL  COMPREHENSIVE METABOLIC PANEL      Result Value Range   Sodium 133 (*) 135 - 145 mEq/L   Potassium 3.4 (*) 3.5 - 5.1 mEq/L   Chloride 98  96 - 112 mEq/L   CO2 25  19 - 32 mEq/L   Glucose, Bld 191 (*) 70 - 99 mg/dL   BUN 21  6 - 23 mg/dL   Creatinine, Ser 7.82 (*) 0.50 - 1.10 mg/dL   Calcium 8.2 (*) 8.4 - 10.5 mg/dL   Total Protein 6.1  6.0 - 8.3 g/dL   Albumin 3.2 (*) 3.5 - 5.2 g/dL   AST 19  0 - 37 U/L   ALT 9  0 - 35 U/L   Alkaline Phosphatase 81  39 - 117 U/L   Total Bilirubin 0.5  0.3 - 1.2 mg/dL   GFR calc non Af Amer 36 (*) >90 mL/min   GFR calc Af Amer 42 (*) >90 mL/min  POCT I-STAT, CHEM 8      Result Value Range   Sodium 136  135 - 145 mEq/L   Potassium 3.6  3.5 - 5.1 mEq/L   Chloride 99  96 - 112 mEq/L   BUN 22  6 - 23 mg/dL   Creatinine, Ser 9.56 (*) 0.50 - 1.10 mg/dL   Glucose, Bld 213 (*) 70 - 99 mg/dL   Calcium, Ion 0.86 (*) 1.13 - 1.30 mmol/L   TCO2 27  0 - 100 mmol/L   Hemoglobin 7.5 (*) 12.0 - 15.0 g/dL   HCT 57.8 (*) 46.9 - 62.9 %  POCT I-STAT TROPONIN I      Result Value Range   Troponin i, poc 0.02  0.00 - 0.08 ng/mL   Comment 3           OCCULT BLOOD, POC DEVICE      Result Value Range   Fecal Occult Bld POSITIVE (*) NEGATIVE  TYPE AND SCREEN      Result Value Range   ABO/RH(D) A POS     Antibody Screen NEG     Sample Expiration 10/10/2012     Dg Chest Portable 1 View  10/07/2012   *RADIOLOGY REPORT*  Clinical Data: Chest pain.  Altered consciousness.  Coronary artery disease.  PORTABLE CHEST - 1 VIEW  Comparison: 05/25/2012  Findings: Midline trachea.  Mild cardiomegaly with atherosclerosis in the transverse aorta.  Small bilateral pleural effusions. No pneumothorax.  Mild lower lobe  predominant interstitial edema. Patchy bibasilar airspace disease.  IMPRESSION: Mild interstitial edema with small bilateral pleural effusions.  Bibasilar airspace disease, likely atelectasis.   Original Report Authenticated By: Jeronimo Greaves, M.D.    Date: 10/07/2012  Rate: 80  Rhythm: normal sinus rhythm  QRS Axis: normal  Intervals: normal  ST/T Wave abnormalities: ST depressions inferiorly and ST depressions laterally  Conduction Disutrbances:nonspecific intraventricular conduction delay  Narrative Interpretation:   Old EKG Reviewed: changes noted new  ST depressions  1:49 AM d/w CAR, Dr Nadara Eaton, plan MED admit, he states in not a candidate for cath and please do not give ASA. He is aware PT received 325 ASA in route with EMS.   T/S for transfusion. MED consult, d/w Dr Lovell Sheehan, plan admit step down  MDM  CP and rectal bleeding has h/o same with recent admit NSTEMI ischemia felt to be related to anemia, has low EF is on Lasix  IV Lasix provided for hypoxia and evidence of pulm edema on CXR  ECG, labs, CXR  CAR consulted and MED admit anemia/ heart failure/ CP known CAD      Sunnie Nielsen, MD 10/07/12 9562

## 2012-10-07 NOTE — Progress Notes (Signed)
PULMONARY  / CRITICAL CARE MEDICINE   Name: Darlene [name] MRN: [id number] DOB: 02/19/1921    ADMISSION DATE:  10/07/2012 CONSULTATION DATE:  10/07/2012  REFERRING MD :  Dr. Sharon Seller PRIMARY MD: Norins   BRIEF PATIENT DESCRIPTION: 46F with BRBPR and acute drop of Hb, also has CHF and worsening dyspnea + pulmonary edema, ST depression lateral leads. Initially admitted by [facility]. Seen in consultation by PCCM on day of admission  SIGNIFICANT EVENTS / STUDIES:    LINES / TUBES:   CULTURES: None  ANTIBIOTICS: None  SUBJECTIVE:  Intermittently agitated. Poorly oriented. Dyspneic and marginal sats on NRB mask.   VITAL SIGNS: Temp:  [97.4 F (36.3 C)-99.8 F (37.7 C)] 97.8 F (36.6 C) (06/29 1540) Pulse Rate:  [66-129] 88 (06/29 1700) Resp:  [16-32] 17 (06/29 1700) BP: (98-139)/(44-94) 132/53 mmHg (06/29 1700) SpO2:  [74 %-96 %] 95 % (06/29 1700) Weight:  [55 kg (121 lb 4.1 oz)] 55 kg (121 lb 4.1 oz) (06/29 0355) HEMODYNAMICS:   VENTILATOR SETTINGS:   INTAKE / OUTPUT: Intake/Output     06/28 0701 - 06/29 0700 06/29 0701 - 06/30 0700   P.O.  60   Total Intake(mL/kg)  60 (1.1)   Urine (mL/kg/hr) 100 720 (1.2)   Total Output 100 720   Net -100 -660        Urine Occurrence  1 x     PHYSICAL EXAMINATION: General:  Intermittently agitated. Moderately dyspneic Neuro:  RASS -1 to +1. + F/C. MAEs. Poorly oriented HEENT: WNL Cardiovascular:  Tachy, reg, no M noted Lungs:  Scattered wheezes, basilar crackles Abdomen:  Soft, no tenderness, no guarding Musculoskeletal:  Mild edema, good capillary refill Skin:  Several ecchymoses  LABS:  Recent Labs Lab 10/07/12 0041 10/07/12 0053 10/07/12 0511 10/07/12 1146 10/07/12 1147 10/07/12 1549  HGB 7.4* 7.5* 7.4* 9.0*  --   --   WBC 9.0  --  9.2  --   --   --   PLT 124*  --  135*  --   --   --   NA 133* 136 134*  --   --   --   K 3.4* 3.6 4.0  --   --   --   CL 98 99 98  --   --   --   CO2 25  --  26  --   --   --    GLUCOSE 191* 189* 138*  --   --   --   BUN 21 22 22   --   --   --   CREATININE 1.27* 1.20* 1.33*  --   --   --   CALCIUM 8.2*  --  8.5  --   --   --   AST 19  --   --   --   --   --   ALT 9  --   --   --   --   --   ALKPHOS 81  --   --   --   --   --   BILITOT 0.5  --   --   --   --   --   PROT 6.1  --   --   --   --   --   ALBUMIN 3.2*  --   --   --   --   --   TROPONINI  --   --  <0.30  --  <0.30 0.47*  PROBNP  --   --  5943.0*  --   --   --    No results found for this basename: GLUCAP,  in the last 168 hours  CXR: edema pattern   ASSESSMENT: 1. LGIB - son indicates prior hx of same. H/Oischmic colitis on colonoscopy 7/13 2. Acute blood loss anemia 3. Ischemic changes on EKG, marginally positive cardiac markers -  Not a candidate for invasive eval 4. Cardiomyopathy - EF 35-40% by Echo 2/14 5. Delirium 6. Acute hypoxic resp failure due to pulmonary edema  PLAN: Cont supplemental O2 PRN BiPAP Diuresis as permitted by BP and renal function PRBCs to maintain Hgb > 8.5 Empiric PPI (though doubt UGI source of blood loss)  I discussed advanced directives with pt's son as pt is too encephalopathic to address these issues. He understands that she is critically ill with multiple major issues. He is aware that CPR/ACLS would not lead to an outcome that would be considered a success by any meaningful measure. He recognizes that intubation/mech ventilation would be an intervention from which she would not recover and live with a QOL that would be satisfactory to her. He indicates that she would not wish to undergo ACLS/CPR or intubation given the above realities. I have made her NCB in event of arrest and DNI in event of progressive resp failure   CRITICAL CARE: The patient is critically ill with multiple organ systems failure and requires high complexity decision making for assessment and support, frequent evaluation and titration of therapies, application of advanced monitoring  technologies and extensive interpretation of multiple databases. Critical Care Time devoted to patient care services described in this note is 30 minutes.   Billy Fischer, MD ; [facility] [phone].  After 5:30 PM or weekends, call [phone]  Pulmonary and Critical Care Medicine [facility] Pager: [phone]  10/07/2012, 5:38 PM

## 2012-10-07 NOTE — Progress Notes (Signed)
Dr Vassie Loll notified of deterioration of pt's status. Pt anxious & restless. Sats 77-84% Pt placed on NRM. Rec'd new orders to administer Lasix 40 in addition to the Lasix given in ER.  05:10 Respiratory rate decreased. Pt appears more comfortable VSS.

## 2012-10-07 NOTE — Progress Notes (Signed)
CRITICAL VALUE ALERT  Critical value received:  Troponin 0.47  Date of notification:  10/07/12  Time of notification:  16:48  Critical value read back:yes  Nurse who received alert:  Ulis Rias, RN  MD notified (1st page):  16:49  Time of first page:  called  MD notified (2nd page):  Time of second page:  Responding MD:  [email or web address]  Time MD responded:  16:49

## 2012-10-07 NOTE — Progress Notes (Signed)
Placed pt on bipap vision .  Sat were down to 70-80.  Small mask used.  Set on 12/6 rate 16 O2 100%.  Sats are now in low 90's.

## 2012-10-08 ENCOUNTER — Inpatient Hospital Stay (HOSPITAL_COMMUNITY): Payer: Medicare Other

## 2012-10-08 LAB — TYPE AND SCREEN: Unit division: 0

## 2012-10-08 LAB — BASIC METABOLIC PANEL
BUN: 24 mg/dL — ABNORMAL HIGH (ref 6–23)
Chloride: 100 mEq/L (ref 96–112)
Glucose, Bld: 119 mg/dL — ABNORMAL HIGH (ref 70–99)
Potassium: 3.4 mEq/L — ABNORMAL LOW (ref 3.5–5.1)

## 2012-10-08 LAB — CBC
HCT: 27.5 % — ABNORMAL LOW (ref 36.0–46.0)
Hemoglobin: 9.1 g/dL — ABNORMAL LOW (ref 12.0–15.0)
MCH: 28.9 pg (ref 26.0–34.0)
MCHC: 33.1 g/dL (ref 30.0–36.0)

## 2012-10-08 MED ORDER — FUROSEMIDE 40 MG PO TABS
40.0000 mg | ORAL_TABLET | Freq: Every day | ORAL | Status: DC
  Administered 2012-10-08 – 2012-10-13 (×6): 40 mg via ORAL
  Filled 2012-10-08 (×6): qty 1

## 2012-10-08 MED ORDER — MORPHINE SULFATE 2 MG/ML IJ SOLN
2.0000 mg | INTRAMUSCULAR | Status: DC | PRN
  Administered 2012-10-08 – 2012-10-09 (×4): 2 mg via INTRAVENOUS
  Filled 2012-10-08 (×4): qty 1

## 2012-10-08 MED ORDER — POTASSIUM CHLORIDE 10 MEQ/100ML IV SOLN
10.0000 meq | INTRAVENOUS | Status: AC
  Administered 2012-10-08 (×2): 10 meq via INTRAVENOUS
  Filled 2012-10-08: qty 200

## 2012-10-08 MED ORDER — ACETAMINOPHEN 325 MG PO TABS
650.0000 mg | ORAL_TABLET | Freq: Four times a day (QID) | ORAL | Status: DC | PRN
  Administered 2012-10-08: 650 mg via ORAL
  Filled 2012-10-08: qty 2

## 2012-10-08 NOTE — Progress Notes (Signed)
RN called me to bedside d/t pt desat on NRB- sats 74%.  Placed pt on 100% fio2 via BIPAP, sats increased to 94%.  RN aware.

## 2012-10-08 NOTE — Progress Notes (Signed)
Pt taken off Bipap for pt comfort. May place back on Bipap if needed per MD. Pt tolerating NRB at this time.

## 2012-10-08 NOTE — Progress Notes (Signed)
Dr. Vassie Loll made aware of pt. Temp 101.6- order received for Tylenol will follow up. Darlene [name]

## 2012-10-08 NOTE — Progress Notes (Signed)
Courtesy: Patient uncomfortable with BiPAP. Lungs seem relatively clear. Minor bump in tronponin noted - has happened on multipleoccasions when she becomes anemic. Reviewed Dr. Sharol Harness note - agree with code status.  Will follow socially until stable enough to transfer to IM service.    Thanks.

## 2012-10-08 NOTE — Clinical Documentation Improvement (Signed)
THIS DOCUMENT IS NOT A PERMANENT PART OF THE MEDICAL RECORD  Please update your documentation with the medical record to reflect your response to this query. If you need help knowing how to do this please call [phone].  10/08/12  Dear Dr.Norins/ Associates,  In a better effort to capture your patient's severity of illness, reflect appropriate length of stay and utilization of resources, a review of the patient medical record has revealed the following indicators the diagnosis of Heart Failure.    Based on your clinical judgment, please clarify and document in a progress note and/or discharge summary the clinical condition associated with the following supporting information:  In responding to this query please exercise your independent judgment.  The fact that a query is asked, does not imply that any particular answer is desired or expected.    Possible Clinical Conditions?  Acute Pulmonary Edema Chronic Systolic Congestive Heart Failure Chronic Diastolic Congestive Heart Failure Chronic Systolic & Diastolic Congestive Heart Failure Acute Systolic Congestive Heart Failure Acute Diastolic Congestive Heart Failure Acute Systolic & Diastolic Congestive Heart Failure Acute on Chronic Systolic Congestive Heart Failure Acute on Chronic Diastolic Congestive Heart Failure Acute on Chronic Systolic & Diastolic  Congestive Heart Failure Other Condition Cannot Clinically Determine      Risk Factors: CHF, pulmonary edema, and volume overload noted per 6/29 progress notes.  Diagnostics: 6/29: CXR results: Worsening of CHF.  6/29: proBNP: 5943.0   Treatment: 6/29:  Bumex 2 mg IV x1;  Lasix 80 mg IV x1;  Lasix 40mg  IV q12hr. 6/30:  Lasix 40mg  po qd.   Reviewed: No additional documentation provided.  Thank You,  Marciano Sequin,  Clinical Documentation Specialist: [phone] Health Information Management [place]

## 2012-10-08 NOTE — Progress Notes (Signed)
PULMONARY  / CRITICAL CARE MEDICINE   Name: Darlene [name] MRN: [id number] DOB: June 03, 1920    ADMISSION DATE:  10/07/2012 CONSULTATION DATE:  10/07/2012  REFERRING MD :  Dr. Sharon Seller PRIMARY MD: Norins   BRIEF PATIENT DESCRIPTION: 89F with BRBPR and acute drop of Hb, also has CHF and worsening dyspnea + pulmonary edema, ST depression lateral leads. Initially admitted by [facility]. Seen in consultation by PCCM on day of admission 10/07/2012   has a past medical history of UTI (urinary tract infection); IBS (irritable bowel syndrome); Abdominal pain, left lower quadrant; CAD (coronary artery disease); Pneumonia; HTN (hypertension); Hyperlipidemia; Diverticulosis of colon; History of colon cancer; Allergic rhinitis; Angina; Cancer; Arthritis; Shortness of breath; Anemia; Blood transfusion; Anxiety; CHF (congestive heart failure); GERD (gastroesophageal reflux disease); and Peripheral vascular disease.   has past surgical history that includes Hemicolectomy (1991); Angioplasty (1989); Appendectomy; Bilateral salpingoophorectomy; Breast surgery; ORIF hip fracture (12/2009); Esophagogastroduodenoscopy (08/09/2011); Colonoscopy (10/24/2011); and Colon surgery.    LINES / TUBES:   CULTURES: None No results found for this basename: LATICACIDVEN, PROCALCITON,  in the last 168 hours   ANTIBIOTICS: Anti-infectives   None        EVENTS 10/07/12: Intermittently agitated. Poorly oriented. Dyspneic and marginal sats on NRB mask. Now DNR, DNI   SUBJECTIVE/OVERNIGHT/INTERVAL HX 10/08/12: Went on bipap at 4pm yesterday and stayed on bipap all night till 15 min ago. Now on NRB. Alert and Oriented. C/o lot of thirst. Of note, did get 1 unit PRBC yesterday   VITAL SIGNS: Temp:  [97.6 F (36.4 C)-100 F (37.8 C)] 100 F (37.8 C) (06/30 0733) Pulse Rate:  [73-119] 88 (06/30 0817) Resp:  [13-32] 16 (06/30 0817) BP: (98-139)/(46-98) 109/50 mmHg (06/30 0817) SpO2:  [74 %-99 %] 93 % (06/30 0817) FiO2  (%):  [90 %-100 %] 100 % (06/30 0817) Weight:  [119 lb 0.8 oz (54 kg)] 119 lb 0.8 oz (54 kg) (06/30 0400) HEMODYNAMICS:   VENTILATOR SETTINGS: Vent Mode:  [-]  FiO2 (%):  [90 %-100 %] 100 % INTAKE / OUTPUT: Intake/Output     06/29 0701 - 06/30 0700 06/30 0701 - 07/01 0700   P.O. 60    I.V. (mL/kg) 6 (0.1)    IV Piggyback 100 100   Total Intake(mL/kg) 166 (3.1) 100 (1.9)   Urine (mL/kg/hr) 1780 (1.4) 175 (1.7)   Total Output 1780 175   Net -1614 -75        Urine Occurrence 2 x      PHYSICAL EXAMINATION: General:  Intermittently agitated. Moderately dyspneic Neuro:  RASS -1 to +1. + F/C. MAEs. Poorly oriented HEENT: WNL Cardiovascular:  Tachy, reg, no M noted Lungs:  Scattered wheezes, basilar crackles Abdomen:  Soft, no tenderness, no guarding Musculoskeletal:  Mild edema, good capillary refill Skin:  Several ecchymoses  LABS: - see individual A &P  IMAGING x 48h Dg Chest Port 1 View  10/08/2012   *RADIOLOGY REPORT*  Clinical Data: Respiratory failure, follow-up  PORTABLE CHEST - 1 VIEW  Comparison: Portable chest x-ray of 10/07/2012 and 05/25/2012  Findings: There has been definite worsening of congestive heart failure with more pulmonary vascular congestion present. Cardiomegaly and small effusions remain.  IMPRESSION: Worsening of CHF.   Original Report Authenticated By: Dwyane Dee, M.D.   Dg Chest Portable 1 View  10/07/2012   *RADIOLOGY REPORT*  Clinical Data: Chest pain.  Altered consciousness.  Coronary artery disease.  PORTABLE CHEST - 1 VIEW  Comparison: 05/25/2012  Findings: Midline trachea.  Mild  cardiomegaly with atherosclerosis in the transverse aorta.  Small bilateral pleural effusions. No pneumothorax.  Mild lower lobe predominant interstitial edema. Patchy bibasilar airspace disease.  IMPRESSION: Mild interstitial edema with small bilateral pleural effusions.  Bibasilar airspace disease, likely atelectasis.   Original Report Authenticated By: Jeronimo Greaves, M.D.     ASSESSMENT AND PLAN  RESPIRATORY No results found for this basename: PHART, PCO2, PCO2ART, PO2ART, HCO3, O2SAT,  in the last 168 hours A:   Acute hypoxic resp failure due to pulmonary edema  09/29/11: Neeed bipap all night  PLAN: Cont supplemental O2 PRN BiPAP    CARDIAC  Recent Labs Lab 10/07/12 0511 10/07/12 1147 10/07/12 1549  TROPONINI <0.30 <0.30 0.47*  PROBNP 5943.0*  --   --    A: Ischemic changes on EKG, marginally positive cardiac markers -  Not a candidate for invasive eval . Cardiomyopathy - EF 35-40% by Echo 05/24/2012  PLan   - monitor  NEUROLOGIC A:  Delirium 10/08/12: Somehwat improved  P: Prn opioid and benzo (she is on both at home)  INFECTIOUS DISEASE  Recent Labs Lab 10/07/12 0041 10/07/12 0511 10/08/12 0410  WBC 9.0 9.2 9.6  No results found for this basename: LATICACIDVEN, PROCALCITON,  in the last 168 hours  A:  No evidence of infection P: Monitor  RENAL and ELECTROLYTES  Recent Labs Lab 10/07/12 0041 10/07/12 0053 10/07/12 0511 10/08/12 0410  NA 133* 136 134* 138  K 3.4* 3.6 4.0 3.4*  CL 98 99 98 100  CO2 25  --  26 31  GLUCOSE 191* 189* 138* 119*  BUN 21 22 22  24*  CREATININE 1.27* 1.20* 1.33* 1.23*  CALCIUM 8.2*  --  8.5 8.6   Estimated Creatinine Clearance: 26.4 ml/min (by C-G formula based on Cr of 1.23).  Intake/Output     06/29 0701 - 06/30 0700 06/30 0701 - 07/01 0700   P.O. 60    I.V. (mL/kg) 6 (0.1)    IV Piggyback 100 100   Total Intake(mL/kg) 166 (3.1) 100 (1.9)   Urine (mL/kg/hr) 1780 (1.4) 175 (1.6)   Total Output 1780 175   Net -1614 -75        Urine Occurrence 2 x      A: CKD - ? Stage 4 P: Lasix  HEMATOLOGIC  Recent Labs Lab 10/07/12 0041 10/07/12 0053 10/07/12 0511 10/07/12 1146 10/07/12 2006 10/08/12 0410  HGB 7.4* 7.5* 7.4* 9.0* 9.3* 9.1*  HCT 23.1* 22.0* 23.2* 27.2* 27.4* 27.5*  PLT 124*  --  135*  --   --  137*   A:  Reported LGI Bleed. S/p 1 unit PRBC 10/07/12 P: PRBC  for hgb < 7gm% only (no evidence of NSTEMI) PPI   GASTROINTESTINAL  Recent Labs Lab 10/07/12 0041  AST 19  ALT 9  ALKPHOS 81  BILITOT 0.5  PROT 6.1  ALBUMIN 3.2*   A:  LGI bleed P: PPI No need for gi consult now  ENDOCRINE  Recent Labs Lab 10/07/12 0511  TSH 1.849   A:  Nil acute P:  monitor  DERMATOLOGY A: BLister sole of rt foot P: Monitor    GLOBAL:  10/08/12: I discussed advanced directives with pt's son as pt is too encephalopathic to address these issues. He understands that she is critically ill with multiple major issues. He is aware that CPR/ACLS would not lead to an outcome that would be considered a success by any meaningful measure. He recognizes that intubation/mech ventilation would be an intervention from  which she would not recover and live with a QOL that would be satisfactory to her. He indicates that she would not wish to undergo ACLS/CPR or intubation given the above realities. I have made her NCB in event of arrest and DNI in event of progressive resp failure   10/08/12: No family at bedside. CCM d/w Dr Debby Bud: Says patient has refused in past to engage in conversations about goals of care and advance directives. Apparently patient son is dependent on patient . Dr Debby Bud will try to engage with patient about goals of care     The patient is critically ill with multiple organ systems failure and requires high complexity decision making for assessment and support, frequent evaluation and titration of therapies, application of advanced monitoring technologies and extensive interpretation of multiple databases.   Critical Care Time devoted to patient care services described in this note is  35  Minutes .  Dr. Kalman Shan, M.D., [facility].C.P Pulmonary and Critical Care Medicine Staff Physician [facility]  Pulmonary and Critical Care Pager: [phone], If no answer or between  15:00h - 7:00h: call [phone]  10/08/2012 9:01  AM

## 2012-10-08 NOTE — Progress Notes (Signed)
eLink Nursing ICU Electrolyte Replacement Protocol  Patient Name: Darlene [name] DOB: December 27, 1920 MRN: [id number]  Date of Service  10/08/2012 K+ 3.4  Pt does not fit ICU elctrolyte replacement protocol criteria. GFR 38 MD notified  Instructed to give IV  HPI/Events of Note    Recent Labs Lab 10/07/12 0041 10/07/12 0053 10/07/12 0511 10/08/12 0410  NA 133* 136 134* 138  K 3.4* 3.6 4.0 3.4*  CL 98 99 98 100  CO2 25  --  26 31  GLUCOSE 191* 189* 138* 119*  BUN 21 22 22  24*  CREATININE 1.27* 1.20* 1.33* 1.23*  CALCIUM 8.2*  --  8.5 8.6    Estimated Creatinine Clearance: 26.4 ml/min (by C-G formula based on Cr of 1.23).  Intake/Output     06/29 0701 - 06/30 0700   P.O. 60   I.V. (mL/kg) 6 (0.1)   Total Intake(mL/kg) 66 (1.2)   Urine (mL/kg/hr) 1740 (1.3)   Total Output 1740   Net -1674       Urine Occurrence 2 x    - I/O DETAILED x24h    Total I/O In: 6 [I.V.:6] Out: 860 [Urine:860] - I/O THIS SHIFT    ASSESSMENT   eICURN Interventions     ASSESSMENT: MAJOR ELECTROLYTE    Merita Norton 10/08/2012, 6:08 AM

## 2012-10-08 NOTE — Progress Notes (Addendum)
Goals of care with patient: explained that she is at end of life. Discussed with her comfort care but she is not ready to stop respiratory efforts, i.e BiPAP but knows that this is finite. I have spoken with her son Darlene [name] and relayed to him the gravity of the situation  Plan Will target an O2 sat ofr 80% or higher with rebreather, but may resume BiPAP tonight if needed.  Will start MS 2 mg q 3 with titration to comfort  Will start low dose lorazepam for anxiety and comfort.  In the AM will revisit stopping CiPAP and transferring to palliative care.     This note was written by Dr. Debby Bud

## 2012-10-08 NOTE — Progress Notes (Signed)
UR Completed.  Darlene [name] Jane [phone] 10/08/2012  

## 2012-10-09 LAB — BASIC METABOLIC PANEL
BUN: 28 mg/dL — ABNORMAL HIGH (ref 6–23)
Calcium: 9 mg/dL (ref 8.4–10.5)
GFR calc non Af Amer: 41 mL/min — ABNORMAL LOW (ref >90)
Glucose, Bld: 110 mg/dL — ABNORMAL HIGH (ref 70–99)
Potassium: 3.4 mEq/L — ABNORMAL LOW (ref 3.5–5.1)

## 2012-10-09 MED ORDER — MORPHINE SULFATE 2 MG/ML IJ SOLN
2.0000 mg | INTRAMUSCULAR | Status: DC | PRN
  Administered 2012-10-09 – 2012-10-10 (×7): 2 mg via INTRAVENOUS
  Filled 2012-10-09 (×7): qty 1

## 2012-10-09 MED ORDER — POTASSIUM CHLORIDE 10 MEQ/100ML IV SOLN
10.0000 meq | INTRAVENOUS | Status: AC
  Administered 2012-10-09 (×2): 10 meq via INTRAVENOUS
  Filled 2012-10-09: qty 200

## 2012-10-09 MED ORDER — PANTOPRAZOLE SODIUM 40 MG PO TBEC
40.0000 mg | DELAYED_RELEASE_TABLET | Freq: Two times a day (BID) | ORAL | Status: DC
  Administered 2012-10-10 – 2012-10-13 (×7): 40 mg via ORAL
  Filled 2012-10-09 (×7): qty 1

## 2012-10-09 NOTE — Progress Notes (Signed)
Patient is floor status- awaiting bed on med-surg unit per Dr. Delford Field and Dr. Inocente Salles with CCM

## 2012-10-09 NOTE — Progress Notes (Signed)
Nutrition Brief Note  Chart reviewed. Pt now transitioning to comfort care.  No further nutrition interventions warranted at this time.  Please re-consult as needed.   Keaten Mashek MS, RD, LDN Pager: [phone] After-hours pager: [phone]    

## 2012-10-09 NOTE — Progress Notes (Signed)
Reported off to "[place], RN" on unit 6700. Patient to be transferred, via bed, as ordered to room 6713. No acute distress noted at this time.  Safety maintained. VSS at 142/90, 92, 14, 100% on NRB mask. Temp 99.2 All belongings, including walking cane, meds, and chart sent with patient to unit 6700.

## 2012-10-09 NOTE — Progress Notes (Signed)
Courtesy - patient scared into living - she has done well overnight w/o BiPAP but has done well with regular ativan and morphine. She is awake this morning and recognizes this examiner.  She should be able to be transferred to a non-tele bed and continue the present regimen: high flow oxygen support, no resumption of BiPAP, continue No code status and supportive medications.   May transfer to my service.  Prognosis guarded but the tough do survive.

## 2012-10-09 NOTE — Clinical Social Work Psychosocial (Signed)
Clinical Social Work Department BRIEF PSYCHOSOCIAL ASSESSMENT 10/09/2012  Patient:  Darlene [name], Darlene [name]     Account Number:  [id number]     Admit date:  10/07/2012  Clinical Social Worker:  Madaline Guthrie  Date/Time:  10/09/2012 04:45 PM  Referred by:  Care Management  Date Referred:  10/09/2012 Referred for  SNF Placement   Other Referral:   Interview type:  Family Other interview type:    PSYCHOSOCIAL DATA Living Status:  FAMILY Admitted from facility:   Level of care:   Primary support name:  Ethelene Browns  [phone] Primary support relationship to patient:  CHILD, ADULT Degree of support available:   very good.    CURRENT CONCERNS Current Concerns  None Noted   Other Concerns:    SOCIAL WORK ASSESSMENT / PLAN CSW met with pt's son.  Pt lives with son and has been independent with ADL's prior to hospitalization.    Son states that pt and he want her to come home with him with [facility] at discharge.  Son states he can provide any physical assistance that pt needs and can be there 24/7.  CSW  reported this to CM who will assist pt and son with discharge planning.  CSW will sign off.   Assessment/plan status:  No Further Intervention Required Other assessment/ plan:   Information/referral to community resources:    PATIENT'S/FAMILY'S RESPONSE TO PLAN OF CARE: Pt and son were pleasant.  Their wish is for pt to go home with [facility].

## 2012-10-09 NOTE — Progress Notes (Signed)
Dr Debby Bud called to check on pt respiratory status. Advised that pt was weaned down to a Venti mask from NonRebreather (15L), satting 88% on 12L with no signs or complaints of respiratory distress. Pt resting comfortably in bed. Per Dr Debby Bud, the goal is to keep O2 sats above 80 and comfortable. Will keep pt on Venti mask and pass on to next nurse to continue monitoring

## 2012-10-09 NOTE — Progress Notes (Signed)
Dr. Delford Field at bedside and made aware of SOB, use of accessory muscles and inability to swallow water without coughing.

## 2012-10-09 NOTE — Progress Notes (Signed)
PULMONARY  / CRITICAL CARE MEDICINE   Name: Darlene [name] MRN: [id number] DOB: [birth year]    ADMISSION DATE:  10/07/2012 CONSULTATION DATE:  10/07/2012  REFERRING MD :  Dr. Sharon Seller PRIMARY MD: Norins   BRIEF PATIENT DESCRIPTION: 8F with BRBPR and acute drop of Hb, also has CHF and worsening dyspnea + pulmonary edema, ST depression lateral leads. Initially admitted by [facility]. Seen in consultation by PCCM on day of admission 10/07/2012   has a past medical history of UTI (urinary tract infection); IBS (irritable bowel syndrome); Abdominal pain, left lower quadrant; CAD (coronary artery disease); Pneumonia; HTN (hypertension); Hyperlipidemia; Diverticulosis of colon; History of colon cancer; Allergic rhinitis; Angina; Cancer; Arthritis; Shortness of breath; Anemia; Blood transfusion; Anxiety; CHF (congestive heart failure); GERD (gastroesophageal reflux disease); and Peripheral vascular disease.   has past surgical history that includes Hemicolectomy (1991); Angioplasty (1989); Appendectomy; Bilateral salpingoophorectomy; Breast surgery; ORIF hip fracture (12/2009); Esophagogastroduodenoscopy (08/09/2011); Colonoscopy (10/24/2011); and Colon surgery.    LINES / TUBES:   CULTURES: None No results found for this basename: LATICACIDVEN, PROCALCITON,  in the last 168 hours   ANTIBIOTICS: Anti-infectives   None        EVENTS 10/07/12: Intermittently agitated. Poorly oriented. Dyspneic and marginal sats on NRB mask. Now DNR, DNI  10/08/12: Went on bipap at 4pm yesterday and stayed on bipap all night till 15 min ago. Now on NRB. Alert and Oriented. C/o lot of thirst. Of note, did get 1 unit PRBC yesterday   SUBJECTIVE/OVERNIGHT/INTERVAL HX 10/09/12: Now  NCB and comfort care goal with basic medical care. No resumption of bipap   VITAL SIGNS: Temp:  [98.5 F (36.9 C)-101.6 F (38.7 C)] 99.1 F (37.3 C) (07/01 0728) Pulse Rate:  [81-140] 86 (07/01 0800) Resp:  [17-36] 21 (07/01  0800) BP: (90-140)/(46-84) 134/56 mmHg (07/01 0800) SpO2:  [73 %-99 %] 97 % (07/01 0800) FiO2 (%):  [100 %] 100 % (06/30 1857) HEMODYNAMICS:   VENTILATOR SETTINGS: Vent Mode:  [-]  FiO2 (%):  [100 %] 100 % INTAKE / OUTPUT: Intake/Output     06/30 0701 - 07/01 0700 07/01 0701 - 07/02 0700   P.O. 250    I.V. (mL/kg) 190 (3.5) 10 (0.2)   IV Piggyback 200 100   Total Intake(mL/kg) 640 (11.9) 110 (2)   Urine (mL/kg/hr) 1740 (1.3)    Total Output 1740     Net -1100 +110        Stool Occurrence 1 x      PHYSICAL EXAMINATION: General:  Intermittently agitated. Moderately dyspneic Neuro:  RASS -1 to +1. + F/C. MAEs. Poorly oriented HEENT: WNL Cardiovascular:  Tachy, reg, no M noted Lungs:  Scattered wheezes, basilar crackles Abdomen:  Soft, no tenderness, no guarding Musculoskeletal:  Mild edema, good capillary refill Skin:  Several ecchymoses  LABS: - see individual A &P  IMAGING x 48h Dg Chest Port 1 View  10/08/2012   *RADIOLOGY REPORT*  Clinical Data: Respiratory failure, follow-up  PORTABLE CHEST - 1 VIEW  Comparison: Portable chest x-ray of 10/07/2012 and 05/25/2012  Findings: There has been definite worsening of congestive heart failure with more pulmonary vascular congestion present. Cardiomegaly and small effusions remain.  IMPRESSION: Worsening of CHF.   Original Report Authenticated By: Dwyane Dee, M.D.    ASSESSMENT AND PLAN  RESPIRATORY No results found for this basename: PHART, PCO2, PCO2ART, PO2ART, HCO3, O2SAT,  in the last 168 hours A:   Acute hypoxic resp failure due to pulmonary edema  10/09/12: On  face mask  PLAN: Cont supplemental O2 Dc bipap per goals of care   CARDIAC  Recent Labs Lab 10/07/12 0511 10/07/12 1147 10/07/12 1549  TROPONINI <0.30 <0.30 0.47*  PROBNP 5943.0*  --   --    A: Ischemic changes on EKG, marginally positive cardiac markers -  Not a candidate for invasive eval . Cardiomyopathy - EF 35-40% by Echo 05/24/2012  PLan   -  monitor  NEUROLOGIC A:  Delirium 10/09/12: Somewhat worst  P: Prn opioid and benzo (she is on both at home)  INFECTIOUS DISEASE  Recent Labs Lab 10/07/12 0041 10/07/12 0511 10/08/12 0410  WBC 9.0 9.2 9.6  No results found for this basename: LATICACIDVEN, PROCALCITON,  in the last 168 hours  A:  No evidence of infection P: Monitor  RENAL and ELECTROLYTES  Recent Labs Lab 10/07/12 0041 10/07/12 0053 10/07/12 0511 10/08/12 0410 10/09/12 0357  NA 133* 136 134* 138 138  K 3.4* 3.6 4.0 3.4* 3.4*  CL 98 99 98 100 98  CO2 25  --  26 31 34*  GLUCOSE 191* 189* 138* 119* 110*  BUN 21 22 22  24* 28*  CREATININE 1.27* 1.20* 1.33* 1.23* 1.15*  CALCIUM 8.2*  --  8.5 8.6 9.0   Estimated Creatinine Clearance: 28.3 ml/min (by C-G formula based on Cr of 1.15).  Intake/Output     06/30 0701 - 07/01 0700 07/01 0701 - 07/02 0700   P.O. 250    I.V. (mL/kg) 190 (3.5) 10 (0.2)   IV Piggyback 200 100   Total Intake(mL/kg) 640 (11.9) 110 (2)   Urine (mL/kg/hr) 1740 (1.3)    Total Output 1740     Net -1100 +110        Stool Occurrence 1 x      A: CKD - ? Stage 4 P: Lasix  HEMATOLOGIC  Recent Labs Lab 10/07/12 0041 10/07/12 0053 10/07/12 0511 10/07/12 1146 10/07/12 2006 10/08/12 0410  HGB 7.4* 7.5* 7.4* 9.0* 9.3* 9.1*  HCT 23.1* 22.0* 23.2* 27.2* 27.4* 27.5*  PLT 124*  --  135*  --   --  137*   A:  Reported LGI Bleed. S/p 1 unit PRBC 10/07/12 P: PRBC for hgb < 7gm% only (no evidence of NSTEMI) PPI   GASTROINTESTINAL  Recent Labs Lab 10/07/12 0041  AST 19  ALT 9  ALKPHOS 81  BILITOT 0.5  PROT 6.1  ALBUMIN 3.2*   A:  LGI bleed P: PPI No need for gi consult now  ENDOCRINE  Recent Labs Lab 10/07/12 0511  TSH 1.849   A:  Nil acute P:  monitor  DERMATOLOGY A: BLister sole of rt foot P: Monitor    GLOBAL:  10/08/12: I discussed advanced directives with pt's son as pt is too encephalopathic to address these issues. He understands that she is  critically ill with multiple major issues. He is aware that CPR/ACLS would not lead to an outcome that would be considered a success by any meaningful measure. He recognizes that intubation/mech ventilation would be an intervention from which she would not recover and live with a QOL that would be satisfactory to her. He indicates that she would not wish to undergo ACLS/CPR or intubation given the above realities. I have made her NCB in event of arrest and DNI in event of progressive resp failure   10/08/12: No family at bedside. CCM d/w Dr Debby Bud: Says patient has refused in past to engage in conversations about goals of care and advance directives.  Apparently patient son is dependent on patient . Dr Debby Bud will try to engage with patient about goals of care    10/09/12: No family at bedside. Reviewed Dr Debby Bud note. Move to 6700 floor under his service. PCCM wil sign off. Comfort and baseic medicl care is goal    Dr. Kalman Shan, M.D., [facility].C.P Pulmonary and Critical Care Medicine Staff Physician [facility] Pulmonary and Critical Care Pager: [phone], If no answer or between  15:00h - 7:00h: call [phone]  10/09/2012 10:05 AM

## 2012-10-09 NOTE — Progress Notes (Signed)
[facility] ADULT ICU REPLACEMENT PROTOCOL FOR AM LAB REPLACEMENT ONLY  The patient does apply for the [facility] Adult ICU Electrolyte Replacment Protocol based on the criteria listed below:   1. Is GFR >/= 40 ml/min? yes  Patient's GFR today is 41 2. Is urine output >/= 0.5 ml/kg/hr for the last 6 hours? yes Patient's UOP is 1.2 ml/kg/hr 3. Is BUN < 60 mg/dL? yes  Patient's BUN today is 28 4. Abnormal electrolyte(s): K 3.4 replaced per PIV d/t respiratory difficulty 5. Ordered repletion with: per protocol 6. If [name] panic level lab has been reported, has the CCM MD in charge been notified? yes.   Physician:  Dr Darrick Penna  Darlene [name], Darlene [name] 10/09/2012 5:01 AM

## 2012-10-09 NOTE — Progress Notes (Addendum)
Patient has only been able to tolerate sips of water and ice chips thus far this shift secondary to SOB, and inability to effectively/safely swallow without choking and coughing. Frequent oral care provided and mouth moisture for comfort. Dr. Marchelle Gearing has been made aware.

## 2012-10-10 ENCOUNTER — Inpatient Hospital Stay (HOSPITAL_COMMUNITY): Payer: Medicare Other

## 2012-10-10 LAB — BASIC METABOLIC PANEL
Chloride: 91 mEq/L — ABNORMAL LOW (ref 96–112)
GFR calc non Af Amer: 36 mL/min — ABNORMAL LOW (ref >90)
Glucose, Bld: 114 mg/dL — ABNORMAL HIGH (ref 70–99)
Potassium: 3.5 mEq/L (ref 3.5–5.1)
Sodium: 135 mEq/L (ref 135–145)

## 2012-10-10 LAB — HEMOGLOBIN AND HEMATOCRIT, BLOOD: HCT: 28.8 % — ABNORMAL LOW (ref 36.0–46.0)

## 2012-10-10 MED ORDER — SENNOSIDES-DOCUSATE SODIUM 8.6-50 MG PO TABS
2.0000 | ORAL_TABLET | Freq: Every day | ORAL | Status: DC
  Administered 2012-10-10 – 2012-10-12 (×3): 2 via ORAL
  Filled 2012-10-10 (×4): qty 2

## 2012-10-10 MED ORDER — BISACODYL 10 MG RE SUPP
10.0000 mg | Freq: Once | RECTAL | Status: AC
  Administered 2012-10-10: 10 mg via RECTAL
  Filled 2012-10-10: qty 1

## 2012-10-10 NOTE — Progress Notes (Signed)
Per order patient was put on nasal cannula from venti mask. Patients sats dropped to 70% on 6L. The highest it reached was 74% on 6L. Patient was put back on venti mask and saturation went up to 74%. Patient was not complaining on any shortness of breath or any discomfort. Patient was alert and talking. Rapid response was called to assess patient. Patient's saturation went up to low 80's. Dr Debby Bud was called and told about oxygen sats. Dr Debby Bud was ok with keeping patient on venti mask and the low 80's sats.  Patient had a bowel movement that had some blood in it. Dr Debby Bud was also notified about this. No new orders were made. Patient is stable and will continue to be monitored.

## 2012-10-10 NOTE — Evaluation (Signed)
Physical Therapy Evaluation Patient Details Name: Darlene [name] MRN: [id number] DOB: 1920-11-20 Today's Date: 10/10/2012 Time: 8657-8469 PT Time Calculation (min): 25 min  PT Assessment / Plan / Recommendation History of Present Illness  Pt adm with GI bleed and MI.  Initially on bipap and now on ventimask.  Clinical Impression  Pt admitted with above. Pt currently with functional limitations due to the deficits listed below (see PT Problem List).   Pt will benefit from skilled PT to increase their independence and safety with mobility to allow discharge home with 24 hour assist of son.      PT Assessment  Patient needs continued PT services    Follow Up Recommendations  Home health PT;Supervision/Assistance - 24 hour    Does the patient have the potential to tolerate intense rehabilitation      Barriers to Discharge        Equipment Recommendations  Rolling walker with 5" wheels;Wheelchair (measurements PT)    Recommendations for Other Services     Frequency Min 3X/week    Precautions / Restrictions Precautions Precautions: Fall   Pertinent Vitals/Pain 80% SaO2 on ventimask.      Mobility  Bed Mobility Bed Mobility: Supine to Sit;Sitting - Scoot to Edge of Bed Supine to Sit: 5: Supervision;HOB elevated Sitting - Scoot to Edge of Bed: 5: Supervision Details for Bed Mobility Assistance: incr time Transfers Transfers: Sit to Stand;Stand to Sit Sit to Stand: 3: Mod assist;With upper extremity assist;From bed Stand to Sit: 4: Min assist;With upper extremity assist;With armrests;To chair/3-in-1 Ambulation/Gait Ambulation/Gait Assistance: 3: Mod assist Ambulation Distance (Feet): 2 Feet Assistive device: Straight cane;1 person hand held assist Ambulation/Gait Assistance Details: Pt required both cane and hand held. Gait Pattern: Step-to pattern;Decreased step length - right;Decreased step length - left;Shuffle;Trunk flexed    Exercises     PT Diagnosis:  Difficulty walking;Generalized weakness  PT Problem List: Decreased strength;Decreased activity tolerance;Decreased balance;Decreased mobility;Decreased knowledge of use of DME PT Treatment Interventions: DME instruction;Gait training;Functional mobility training;Therapeutic activities;Therapeutic exercise;Patient/family education;Balance training     PT Goals(Current goals can be found in the care plan section) Acute Rehab PT Goals Patient Stated Goal: go home PT Goal Formulation: With patient Time For Goal Achievement: 10/17/12 Potential to Achieve Goals: Good  Visit Information  Last PT Received On: 10/10/12 Assistance Needed: +1 History of Present Illness: Pt adm with GI bleed and MI.  Initially on bipap and now on ventimask.       Prior Functioning  Home Living Family/patient expects to be discharged to:: Private residence Living Arrangements: Children Available Help at Discharge: Available 24 hours/day Type of Home: House Home Access: Stairs to enter [employer] of Steps: 4 uses wall and assistance of another. Entrance Stairs-Rails: None Home Layout: One level;Laundry or work area in [employer] Equipment: Gilmer Mor - single point;Bedside commode Prior Function Level of Independence: Needs assistance Gait / Transfers Assistance Needed: amb independently with cane Communication Communication: HOH    Cognition  Cognition Arousal/Alertness: Awake/alert Behavior During Therapy: WFL for tasks assessed/performed Overall Cognitive Status: Within Functional Limits for tasks assessed    Extremity/Trunk Assessment Lower Extremity Assessment Lower Extremity Assessment: Generalized weakness   Balance    End of Session PT - End of Session Activity Tolerance: Patient limited by fatigue Patient left: in chair;with call bell/phone within reach;with family/visitor present Nurse Communication: Mobility status  GP     [facility] 10/10/2012, 12:40 PM  [employer]  PT [phone]

## 2012-10-10 NOTE — Progress Notes (Signed)
Subjective: Patient has been transferred to 6700. She is awake and alert.She wants to go home and will not consider SNF  Objective: Lab:  Recent Labs  10/07/12 1146 10/07/12 2006 10/08/12 0410  WBC  --   --  9.6  HGB 9.0* 9.3* 9.1*  HCT 27.2* 27.4* 27.5*  MCV  --   --  87.3  PLT  --   --  137*    Recent Labs  10/08/12 0410 10/09/12 0357  NA 138 138  K 3.4* 3.4*  CL 100 98  GLUCOSE 119* 110*  BUN 24* 28*  CREATININE 1.23* 1.15*  CALCIUM 8.6 9.0    Imaging:  Scheduled Meds: . chlorhexidine  15 mL Mouth/Throat BID  . furosemide  40 mg Oral Daily  . pantoprazole  40 mg Oral BID AC  . sertraline  50 mg Oral Daily  . sodium chloride  3 mL Intravenous Q12H   Continuous Infusions:  PRN Meds:.sodium chloride, acetaminophen, alum & mag hydroxide-simeth, LORazepam, metoprolol, morphine injection, sodium chloride   Physical Exam: Filed Vitals:   10/10/12 0518  BP: 109/53  Pulse: 93  Temp: 98.3 F (36.8 C)  Resp: 19    Intake/Output Summary (Last 24 hours) at 10/10/12 0732 Last data filed at 10/10/12 1610  Gross per 24 hour  Intake    270 ml  Output   1860 ml  Net  -1590 ml   Total this adm: -4.3 liters Gen'l very elderly white woman who appears chronically ill but is awake and alert and in no distress HEENT - C&S clear Cor - 2+ radial , RRR Pulm - on venti mask. Lungs are clear w/o rales Abd - soft    Assessment/Plan: 1. Cardaic - nstemi secondary to acute anemia. No chest pain, hemodynamically stable  2. Pulm - patient with pulmonary edema and respiratory failure who has done much better than anticipated. Now on venti mask - lungs sound clear Plan reduce FiO2 to 4 l [place]. Keep O2 sats above 80  3. Anemia - no signs of recurrent bleeding Plan Follow up H/H  4. Renal - seems stable Plan Voiding trial ` F/u Bmet  5. Dispo - she may actually be able to leave hospital. She desires to go home - may be a hospice candidate.   [name] Oyens  IM (o) [phone]; (c) [phone] Call-grp - [facility] IM  Tele: [phone]  10/10/2012, 7:29 AM

## 2012-10-11 ENCOUNTER — Inpatient Hospital Stay (HOSPITAL_COMMUNITY): Payer: Medicare Other

## 2012-10-11 ENCOUNTER — Telehealth: Payer: Self-pay | Admitting: *Deleted

## 2012-10-11 DIAGNOSIS — Z515 Encounter for palliative care: Secondary | ICD-10-CM

## 2012-10-11 DIAGNOSIS — R0600 Dyspnea, unspecified: Secondary | ICD-10-CM

## 2012-10-11 DIAGNOSIS — R5383 Other fatigue: Secondary | ICD-10-CM

## 2012-10-11 DIAGNOSIS — R531 Weakness: Secondary | ICD-10-CM

## 2012-10-11 DIAGNOSIS — R0609 Other forms of dyspnea: Secondary | ICD-10-CM

## 2012-10-11 LAB — HEMOGLOBIN AND HEMATOCRIT, BLOOD: Hemoglobin: 9.2 g/dL — ABNORMAL LOW (ref 12.0–15.0)

## 2012-10-11 MED ORDER — LORAZEPAM 0.5 MG PO TABS
0.5000 mg | ORAL_TABLET | ORAL | Status: DC | PRN
  Administered 2012-10-11 – 2012-10-13 (×4): 0.5 mg via ORAL
  Filled 2012-10-11 (×5): qty 1

## 2012-10-11 MED ORDER — MORPHINE SULFATE (CONCENTRATE) 10 MG /0.5 ML PO SOLN
5.0000 mg | ORAL | Status: DC | PRN
  Administered 2012-10-11 – 2012-10-13 (×2): 5 mg via SUBLINGUAL
  Filled 2012-10-11 (×3): qty 0.5

## 2012-10-11 NOTE — Telephone Encounter (Signed)
Tina from ward 6700 called to report to Dr Debby Bud that pt had another moderate amount bloody stool sample.

## 2012-10-11 NOTE — Progress Notes (Signed)
Patient had moderate amount of bright red blood and two quarter-sized blood clots with medium clay/brown stool this morning. Dr. Debby Bud notified. Will continue to monitor patient closely.

## 2012-10-11 NOTE — Progress Notes (Signed)
Patient had another episode of moderate amount of bright red blood mixed with brown stool.  Patient had episode earlier today and attending physician was notified by [facility].  Tried to get in touch with Dr. Debby Bud' office but was only asked to leave a message.  Will call Dr. Debby Bud again.

## 2012-10-11 NOTE — Consult Note (Signed)
Patient [name]      DOB: [birth year]      KGM:[id number]     Consult Note from the Palliative Medicine Team at [facility]    Consult Requested by:  Dr Debby Bud     PCP: [name], MD Reason for Consultation: Clarification of GOC and options     Phone [phone]  Assessment of patients Current state:  Darlene [name] is a 79 y.o. female who presents to the ED with complaints of recurrence of painless rectal bleeding,and intermittent chest pain across her chest associated with SOB for the past 2 days. She was evaluated in the ED and was found to have a hemoglobin of 7.5, and her initial troponin has been negative. She was referred for medical admission.  Patient is frail with multiple co-morbidities (acute resp failure, h/o colon cancer, CAD) and overall poor prognosis.   Consult is for review of medical treatment options, clarification of goals of care and end of life issues, disposition and options, and symptom recommendation.  This NP Lorinda Creed reviewed medical records, received report from team, assessed the patient and then meet at the patient's bedside with patient  And then by telephone with son Yocelyn Brocious to discuss diagnosis prognosis, GOC, EOL wishes disposition and options.   A detailed discussion was had today regarding advanced directives.  Concepts specific to code status, artifical feeding and hydration, transfusion and rehospitalization was had.  The difference between a aggressive medical intervention path  and a palliative comfort care path for this patient at this time was had.  Values and goals of care important to patient and family were attempted to be elicited.  Concept of Hospice and Palliative Care were discussed  Questions and concerns addressed.     It is difficult having complex conversations with patient secondary to her hearing impairment.  Both she and her son understand her overall poor prognosis and are open to hospice services  if eligible.   Goals of Care: 1.  Code Status: DNR/DNI   2. Scope of Treatment: 1. Vital Signs: per unit  2. Respiratory/Oxygen: as needed 3. Nutritional Support/Tube Feeds: no artificial feeding now or in the future 4. Antibiotics: as needed to treat the treatable 5. Blood Products:will consider when need arises 6. IVF:yes as needed 7. Review of Medications to be discontinued:continue present medications 8. Labs: as needed     4. Disposition:  Hopeful for home with hospice when medically stable   3. Symptom Management:    1. Pain/Dyspnea: Roxanol 5 mg every 4 hrs prn  2. Anxiety: Ativan 0.5 mg every 4 hrs prn  4. Psychosocial:  Emotional support offered to patient and her son, both are strongly encouraged to discussed anticipated medical  needs and decisions related to patient's overall conditions and long term prognosis.   Patient Documents Completed or Given: Document Given Completed  Advanced Directives Pkt    MOST    DNR  yes  Gone from My Sight    Hard Choices      Brief HPI:   Darlene [name] is a 79 y.o. female who presents to the ED with complaints of recurrence of painless rectal bleeding,and intermittent chest pain across her chest associated with SOB for the past 2 days. She was evaluated in the ED and was found to have a hemoglobin of 7.5, and her initial troponin has been negative. She was referred for medical admission.  Patient is frail with multiple co-morbidities (acute resp failure, h/o colon cancer,  CAD) and overall poor prognosis.     ROS: weakness and generalized fatigue    PMH:  Past Medical History  Diagnosis Date  . UTI (urinary tract infection)   . IBS (irritable bowel syndrome)   . Abdominal pain, left lower quadrant   . CAD (coronary artery disease)   . Pneumonia   . HTN (hypertension)   . Hyperlipidemia   . Diverticulosis of colon   . History of colon cancer   . Allergic rhinitis   . Angina   . Cancer   . Arthritis   .  Shortness of breath   . Anemia   . Blood transfusion   . Anxiety   . CHF (congestive heart failure)   . GERD (gastroesophageal reflux disease)   . Peripheral vascular disease      PSH: Past Surgical History  Procedure Laterality Date  . Hemicolectomy  1991    Right for mgt of colon cancer.  . Angioplasty  1989    Stent x 2  . Appendectomy    . Bilateral salpingoophorectomy    . Breast surgery      benign  . Orif hip fracture  12/2009    Dr Charlann Boxer  . Esophagogastroduodenoscopy  08/09/2011    Procedure: ESOPHAGOGASTRODUODENOSCOPY (EGD);  Surgeon: Hart Carwin, MD;  Location: [facility] ENDOSCOPY;  Service: Endoscopy;  Laterality: N/A;  . Colonoscopy  10/24/2011    Procedure: COLONOSCOPY;  Surgeon: Beverley Fiedler, MD;  Location: [facility] ENDOSCOPY;  Service: Gastroenterology;  Laterality: N/A;  . Colon surgery     I have reviewed the FH and SH and  If appropriate update it with new information. Allergies  Allergen Reactions  . Antihistamines, Chlorpheniramine-Type Other (See Comments)    unknown  . Chocolate Other (See Comments)    Pt states "makes her sick" and she starts coughing   Scheduled Meds: . chlorhexidine  15 mL Mouth/Throat BID  . furosemide  40 mg Oral Daily  . pantoprazole  40 mg Oral BID AC  . senna-docusate  2 tablet Oral QHS  . sertraline  50 mg Oral Daily  . sodium chloride  3 mL Intravenous Q12H   Continuous Infusions:  PRN Meds:.sodium chloride, acetaminophen, alum & mag hydroxide-simeth, LORazepam, metoprolol, morphine CONCENTRATE, sodium chloride    BP 138/68  Pulse 91  Temp(Src) 98 F (36.7 C) (Oral)  Resp 18  Ht 5\' 5"  (1.651 m)  Wt 54.59 kg (120 lb 5.6 oz)  BMI 20.03 kg/m2  SpO2 92%   PPS: 30 %   Intake/Output Summary (Last 24 hours) at 10/11/12 1315 Last data filed at 10/11/12 1153  Gross per 24 hour  Intake    420 ml  Output    175 ml  Net    245 ml    Physical Exam:  General: chronically ill appearing, frail, pleasant elderly female HEENT:   Mm, no exudate Chest:   Diminished in bases CTA CVS: RRR Abdomen:soft NT +BS Ext: BLE trace edema Neuro:alert and oriented X3  Labs: CBC    Component Value Date/Time   WBC 9.6 10/08/2012 0410   RBC 3.15* 10/08/2012 0410   HGB 9.2* 10/11/2012 1035   HCT 28.1* 10/11/2012 1035   PLT 137* 10/08/2012 0410   MCV 87.3 10/08/2012 0410   MCH 28.9 10/08/2012 0410   MCHC 33.1 10/08/2012 0410   RDW 13.7 10/08/2012 0410   LYMPHSABS 0.8 05/23/2012 1103   MONOABS 0.4 05/23/2012 1103   EOSABS 0.0 05/23/2012 1103   BASOSABS 0.0  05/23/2012 1103    BMET    Component Value Date/Time   NA 135 10/10/2012 1010   K 3.5 10/10/2012 1010   CL 91* 10/10/2012 1010   CO2 27 10/10/2012 1010   GLUCOSE 114* 10/10/2012 1010   BUN 39* 10/10/2012 1010   CREATININE 1.27* 10/10/2012 1010   CALCIUM 9.0 10/10/2012 1010   GFRNONAA 36* 10/10/2012 1010   GFRAA 42* 10/10/2012 1010    CMP     Component Value Date/Time   NA 135 10/10/2012 1010   K 3.5 10/10/2012 1010   CL 91* 10/10/2012 1010   CO2 27 10/10/2012 1010   GLUCOSE 114* 10/10/2012 1010   BUN 39* 10/10/2012 1010   CREATININE 1.27* 10/10/2012 1010   CALCIUM 9.0 10/10/2012 1010   PROT 6.1 10/07/2012 0041   ALBUMIN 3.2* 10/07/2012 0041   AST 19 10/07/2012 0041   ALT 9 10/07/2012 0041   ALKPHOS 81 10/07/2012 0041   BILITOT 0.5 10/07/2012 0041   GFRNONAA 36* 10/10/2012 1010   GFRAA 42* 10/10/2012 1010     Time In Time Out Total Time Spent with Patient Total Overall Time  1230 1345 70 min 75 min    Greater than 50%  of this time was spent counseling and coordinating care related to the above assessment and plan.  Lorinda Creed NP  Palliative Medicine Team Team Phone # [phone] Pager [phone]  Discussed with Dr Debby Bud

## 2012-10-11 NOTE — Care Management Note (Addendum)
    Page 1 of 2   10/11/2012     4:15:31 PM   CARE MANAGEMENT NOTE 10/11/2012  Patient:  Darlene [name], Darlene [name]   Account Number:  [id number]  Date Initiated:  10/08/2012  Documentation initiated by:  [facility]  Subjective/Objective Assessment:   ACS - GIB -  CHF - EF 25%     Action/Plan:   Anticipated DC Date:  10/12/2012   Anticipated DC Plan:  HOME W HOSPICE CARE      DC Planning Services  CM consult      PAC Choice  HOSPICE   Choice offered to / List presented to:  C-1 Patient   DME arranged  3-N-1  OXYGEN  SUCTION  TUB BENCH  WALKER - ROLLING  WHEELCHAIR - MANUAL      DME agency  [facility]        [facility] agency  [facility] [place]   Status of service:  Completed, signed off Medicare Important Message given?   (If response is "NO", the following Medicare IM given date fields will be blank) Date Medicare IM given:   Date Additional Medicare IM given:    Discharge Disposition:    Per UR Regulation:  Reviewed for med. necessity/level of care/duration of stay  If discussed at Long Length of Stay Meetings, dates discussed:    Comments:  Contact:  Basden,Anthony Son [phone]  10/11/12-1608-J.Samael Blades,RN,BSN [phone]      Spoke with Margie with [facility]. Hospice packages B & C ordered per request. Derrian notified of equipment needs and probable discharge on Saturday.  10/11/12-1353-J.Larra Crunkleton,RN,BSN [phone]     In to offer choice of home hospice agencies to patient. Patient is extremely hard of hearing. Offered choice and patient chose hospice and palliative care of [place]. Also called son Ethelene Browns at [phone] to verify same choice per patient's request. Margie with [facility] notified and will be in to see patient. Margie estimating discharge this Saturday (10/13/12). CM will continue to follow.  10/11/12-1947-J.Raesean Bartoletti,RN,BSN [phone]      Dr. Arthur Holms in to see patient today. Palliative medical team consulted to give recommendations on  disposition. Awaiting recommendations.  10-09-12 2:15pm Avie Arenas, RNBSN [phone] SW consult recieved - saw patient and talked with son - reports patient lives with son and son would like to have her come back home post op with HH. He will be there and is able to assist. CM will continue to follow for further needs.

## 2012-10-11 NOTE — Evaluation (Signed)
Occupational Therapy Evaluation Patient Details Name: Darlene [name] MRN: [id number] DOB: 08/05/1920 Today's Date: 10/11/2012 Time: 0940-1001 OT Time Calculation (min): 21 min  OT Assessment / Plan / Recommendation History of present illness Pt adm with GI bleed and MI.  Initially on bipap and now on ventimask. Off ventimask this am (10/11/12) and doing well for now   Clinical Impression   This patient seemingly having a better morning than she has had in a while. Pt very ready to get up and move and very appreciative of getting up OOB. Pt is limited by sats dropping with activity, but rebounds nicely with purse lipped breathing. Will benefit from acute OT with follow up HHOT.    OT Assessment  Patient needs continued OT Services    Follow Up Recommendations  Home health OT [facility])       Equipment Recommendations  3 in 1 bedside comode       Frequency  Min 2X/week    Precautions / Restrictions Precautions Precautions: Fall Restrictions Weight Bearing Restrictions: No       ADL  Eating/Feeding: Simulated;Independent Grooming: Simulated;Set up;Supervision/safety Where Assessed - Grooming: Unsupported sitting Upper Body Bathing: Set up;Supervision/safety;Simulated Where Assessed - Upper Body Bathing: Unsupported sitting Lower Body Bathing: Simulated;Minimal assistance Where Assessed - Lower Body Bathing: Supported sit to stand Upper Body Dressing: Simulated;Set up;Supervision/safety Where Assessed - Upper Body Dressing: Unsupported sitting Lower Body Dressing: Performed;Minimal assistance Where Assessed - Lower Body Dressing: Supported sit to stand Toilet Transfer: Simulated;Moderate assistance (pt with posterior lean) Toilet Transfer Method: Sit to stand Toilet Transfer Equipment:  (Bed> to recliner 2 feet away using SPC) Toileting - Clothing Manipulation and Hygiene: Simulated;Moderate assistance Where Assessed - Toileting Clothing Manipulation and Hygiene:  Standing Equipment Used: Cane Transfers/Ambulation Related to ADLs: Min A sit to stand and stand to sit, Mod A for ambulation with SPC ADL Comments: Pt able to cross her legs to get to her feet to donn socks and shoes    OT Diagnosis: Generalized weakness  OT Problem List: Decreased strength;Decreased activity tolerance;Impaired balance (sitting and/or standing);Impaired vision/perception;Decreased knowledge of use of DME or AE OT Treatment Interventions: Self-care/ADL training;DME and/or AE instruction;Patient/family education;Balance training;Energy conservation   OT Goals(Current goals can be found in the care plan section) Acute Rehab OT Goals Patient Stated Goal: go home OT Goal Formulation: With patient Time For Goal Achievement: 10/18/12 Potential to Achieve Goals: Good  Visit Information  Last OT Received On: 10/11/12 Assistance Needed: +1 History of Present Illness: Pt adm with GI bleed and MI.  Initially on bipap and now on ventimask. Off ventimask this am (10/11/12) and doing well for now       Prior Functioning     Home Living Family/patient expects to be discharged to:: Private residence Living Arrangements: Children Available Help at Discharge: Available 24 hours/day Type of Home: House Home Access: Stairs to enter [employer] of Steps: 4 uses wall and assistance of another. Entrance Stairs-Rails: None Home Layout: One level;Laundry or work area in [employer] Equipment: [employer] - single point;Bedside commode;Grab bars - toilet;Grab bars - tub/shower;Shower seat Prior Function Level of Independence: Independent with assistive device(s) Gait / Transfers Assistance Needed: amb independently with cane Communication Communication: HOH Dominant Hand: Right         Vision/Perception Vision - History Baseline Vision:  (Has glasses but does not wear) Visual History: Cataracts Patient Visual Report: No change from baseline   Cognition   Cognition Arousal/Alertness: Awake/alert Behavior During Therapy: WFL for  tasks assessed/performed Overall Cognitive Status: Within Functional Limits for tasks assessed    Extremity/Trunk Assessment Upper Extremity Assessment Upper Extremity Assessment: Overall WFL for tasks assessed     Mobility Bed Mobility Bed Mobility: Supine to Sit;Sitting - Scoot to Edge of Bed Supine to Sit: 5: Supervision;With rails;HOB elevated Sitting - Scoot to Edge of Bed: 5: Supervision;With rail Transfers Transfers: Sit to Stand;Stand to Sit Sit to Stand: 4: Min assist;With upper extremity assist;From bed Stand to Sit: 4: Min assist;With upper extremity assist;With armrests;To chair/3-in-1 Details for Transfer Assistance: VCs for safe hand placement           End of Session OT - End of Session Activity Tolerance:  (limited by drop in O2 sats) Patient left: in chair;with call bell/phone within reach Nurse Communication: Mobility status       Evette Georges [phone] 10/11/2012, 11:11 AM

## 2012-10-11 NOTE — Progress Notes (Signed)
Physical Therapy Treatment Patient Details Name: Darlene [name] MRN: [id number] DOB: 1920-10-25 Today's Date: 10/11/2012 Time: 4098-1191 PT Time Calculation (min): 28 min  PT Assessment / Plan / Recommendation  PT Comments   Pt currently on 6L O2 [place] and ambulated and performed exercises with lowest SaO2 84%.  Pt very thankful for PT visit.   Follow Up Recommendations  Home health PT;Supervision/Assistance - 24 hour     Does the patient have the potential to tolerate intense rehabilitation     Barriers to Discharge        Equipment Recommendations  Rolling walker with 5" wheels;Wheelchair (measurements PT)    Recommendations for Other Services    Frequency     Progress towards PT Goals Progress towards PT goals: Progressing toward goals  Plan Current plan remains appropriate    Precautions / Restrictions Precautions Precautions: Fall Restrictions Weight Bearing Restrictions: No   Pertinent Vitals/Pain Pt maintained on 6L [place] throughout session. SaO2 86% at rest SaO2 84-86% during ambulation SaO2 88-90% during exercises    Mobility  Bed Mobility Bed Mobility: Not assessed Supine to Sit: 5: Supervision;With rails;HOB elevated Sitting - Scoot to Edge of Bed: 5: Supervision;With rail Transfers Transfers: Sit to Stand;Stand to Sit Sit to Stand: 4: Min assist;With upper extremity assist;From chair/3-in-1 Stand to Sit: 4: Min assist;With upper extremity assist;To chair/3-in-1 Details for Transfer Assistance: verbal cues for safe technique Ambulation/Gait Ambulation/Gait Assistance: 4: Min assist Ambulation Distance (Feet): 40 Feet Assistive device: Rolling walker Ambulation/Gait Assistance Details: verbal cues for safe use of RW, ambulated on 6L O2 with SaO2 84-86%, verbal cues for breathing Gait Pattern: Step-to pattern;Trunk flexed;Decreased stride length Gait velocity: decreased    Exercises General Exercises - Lower Extremity Ankle Circles/Pumps: AROM;Both;15  reps;Seated Quad Sets: AROM;Both;10 reps Long Arc Quad: AROM;Both;10 reps;Seated Heel Slides: AROM;Both;10 reps;Seated Hip ABduction/ADduction: AROM;Both;10 reps Hip Flexion/Marching: AROM;Both;10 reps;Seated   PT Diagnosis:    PT Problem List:   PT Treatment Interventions:     PT Goals (current goals can now be found in the care plan section) Acute Rehab PT Goals Patient Stated Goal: go home  Visit Information  Last PT Received On: 10/11/12 Assistance Needed: +1 History of Present Illness: Pt adm with GI bleed and MI.  Initially on bipap and now on ventimask. Off ventimask this am (10/11/12) and doing well for now    Subjective Data  Patient Stated Goal: go home   Cognition  Cognition Arousal/Alertness: Awake/alert Behavior During Therapy: WFL for tasks assessed/performed Overall Cognitive Status: Within Functional Limits for tasks assessed    Balance     End of Session PT - End of Session Equipment Utilized During Treatment: Gait belt Activity Tolerance: Patient tolerated treatment well Patient left: in chair;with call bell/phone within reach   GP     Varina Hulon,KATHrine E 10/11/2012, 12:28 PM Zenovia Jarred, PT, DPT 10/11/2012 Pager: [phone]

## 2012-10-11 NOTE — Progress Notes (Signed)
Notified by Malka So CMRN, patient and family request services of [facility] after discharge. Patient information reviewed with Dr Elliot Gurney, [facility] Medical Director hospice eligible with dx: Heart Failure.  Spoke with patient at bedside and son via phone to initiate education related to hospice services, philosophy and team approach to care- they voiced good understanding of information provided.  Per notes/discussion plan is to for discharge when stable- patient and son hopeful for discharge Saturday, 10/13/12 - patient will require transport by non-emergent vehicle  DME has been requested for delivery to the home later today: Complete Oxygen Package B- pt currently on 6 L [place] continuous and will require a concentrator that goes up to 10 L; Complete Package C: walker with wheels; [id number];Tub seat with back; wheelchair  *Of note-patient lives in neighborhood where their local street is blocked off for 4th of July holiday and transport or delivery to her home will be very difficult if attempted on July 4th - son hopeful for patient transport on Saturday to home and possibly for DME delivery tonight or early Saturday morning  Dr Debby Bud will be attending physician with [facility]; patient pharmacy is [facility] on [address] Initial paperwork faxed to [facility]  * Completed d/c summary will need to be faxed to [facility] @ [phone] when final * Please notify [facility] when patient is ready to leave unit at d/c call [phone];  [facility] information and contact numbers also given to son Alinda Money during phone discussion and information left at bedside to go home with patient.   Above information shared with Malka So CMRN Please call with any questions or concerns   Valente David, RN 10/11/2012, 2:42 PM [facility] Palliative Medicine Team RN Liaison [phone]

## 2012-10-11 NOTE — Progress Notes (Signed)
Subjective: Events noted: on 6 liters O2 sat dropped to 70% although the patient was not in distress; episode of hematochezia - small amount of blood. PT assessment - patient will need 24 hours supervision/assistance.  She is awake and alert this AM and reports she feels better. She lacks insight into her condition. She refuses to consider SNF and insists on going home.   Objective: Lab:  Recent Labs  10/10/12 1010  HGB 9.1*  HCT 28.8*    Recent Labs  10/09/12 0357 10/10/12 1010  NA 138 135  K 3.4* 3.5  CL 98 91*  GLUCOSE 110* 114*  BUN 28* 39*  CREATININE 1.15* 1.27*  CALCIUM 9.0 9.0    Imaging:  Scheduled Meds: . chlorhexidine  15 mL Mouth/Throat BID  . furosemide  40 mg Oral Daily  . pantoprazole  40 mg Oral BID AC  . senna-docusate  2 tablet Oral QHS  . sertraline  50 mg Oral Daily  . sodium chloride  3 mL Intravenous Q12H   Continuous Infusions:  PRN Meds:.sodium chloride, acetaminophen, alum & mag hydroxide-simeth, LORazepam, metoprolol, morphine injection, sodium chloride   Physical Exam: Filed Vitals:   10/11/12 0536  BP: 97/48  Pulse: 82  Temp: 98.4 F (36.9 C)  Resp: 18    Intake/Output Summary (Last 24 hours) at 10/11/12 0748 Last data filed at 10/10/12 2300  Gross per 24 hour  Intake    560 ml  Output    250 ml  Net    310 ml   Total this adm: -3,994  Gen'l- elderly, hard of hearing white woman HEENT- basal cell lesion on left lower eyelid, C&S clear Cor - 2+ radial, RRR Pulm - on venturi mask (which she doesn't keep on), lungs are clear Abd - soft Neuro - HOH, at her baseline re: cognition, non-focal exam.     Assessment/Plan: 1. Cardiac - hemodynamically stable   2. Pulm - lungs sound clear but she has failed nasal cannula oxygen - cannot maintain sats. On Venturi mask sats are in the low 80's. She has had a good diuresis  Plan Will continue oxygen to keep sats in the 80's or above  3. Anemia -  Lab Results  Component Value  Date   HGB 9.1* 10/10/2012   She has had some recurrent bleeding by report.  Plan Lab: H/H  4. Renal good UOP w/o foley  Plan Bmet  5. Dispo - she lacks insight to her condition but she is strong. If possible will try to return home with Hospice and her son's full-time help.  Plan - PMT consult re: hospice eligibility and HH for respiratory therapy  Will convert to PO ativan and Roxanol as needed for respiratory distress   [name] [place] IM (o) [phone]; (c) [phone] Call-grp - Patsi Sears IM  Tele: [phone]  10/11/2012, 7:34 AM

## 2012-10-11 NOTE — Progress Notes (Signed)
Thank you for consulting the Palliative Medicine Team at [facility] to meet your patient's and family's needs.   The reason that you asked [place] to see your patient is for discussion to affirm goals of care; hospice care consult  We have scheduled your patient for a meeting: today Thursday 10/11/12 @ 12:30-1:00pm  The Surrogate decision maker is: currently patient alert and oriented to person,place, time and able to make her own decisions though conversation is a challenge as patient is VERY HOH  Other family members that need to be present: patient would like for son Alinda Money to be involved in discussion; he is at home- has transportation issues but can be available by phone [phone]  Your patient is able/unable to participate: able to participate    Valente David, RN 10/11/2012, 11:55 AM Palliative Medicine Team RN Liaison [phone]

## 2012-10-11 NOTE — Progress Notes (Signed)
In to offer choice of home hospice agencies to patient. Patient is extremely hard of hearing. Offered choice and patient chose [facility] [place]. Also called son Ethelene Browns at [phone] to verify same choice per patient's request. Margie with [facility] will be notified.

## 2012-10-12 LAB — BASIC METABOLIC PANEL
BUN: 27 mg/dL — ABNORMAL HIGH (ref 6–23)
Creatinine, Ser: 1.1 mg/dL (ref 0.50–1.10)
GFR calc Af Amer: 50 mL/min — ABNORMAL LOW (ref >90)
GFR calc non Af Amer: 43 mL/min — ABNORMAL LOW (ref >90)
Potassium: 3 mEq/L — ABNORMAL LOW (ref 3.5–5.1)

## 2012-10-12 MED ORDER — POTASSIUM CHLORIDE CRYS ER 20 MEQ PO TBCR
20.0000 meq | EXTENDED_RELEASE_TABLET | Freq: Three times a day (TID) | ORAL | Status: AC
  Administered 2012-10-12 (×3): 20 meq via ORAL
  Filled 2012-10-12 (×3): qty 1

## 2012-10-12 NOTE — Progress Notes (Signed)
Patient Darlene [name]      DOB: [birth year]      WGN:[id number]   Palliative Medicine Team at [facility] Progress Note    Subjective: Patient sitting in bedside chair, oxygen on via nasal cannula, denies any pain or SOB  Filed Vitals:   10/12/12 0943  BP: 120/58  Pulse: 84  Temp: 97.7 F (36.5 C)  Resp: 18   Physical exam: General: Alert, in NAD CHEST: CTA bilaterally, respirations unlabored CVS: RRR ABD: soft, non-tender, BS audible NEURO:    Assessment and plan: Patient elderly female admitted with rectal bleeding, accompanied by chest pain and SOB.   Code Status: DNR/DNI  Symptom Management: 1. Pain/Dyspnea: Roxanol 5 mg every 4 hrs prn  2. Anxiety: Ativan 0.5 mg every 4 hrs prn 3. Disposition: plan is for discharge home with hospice support  Time In Time Out Total Time Spent with Patient Total Overall Time  11:00a 11:15a 15 min 15 min   Freddie Breech, CNS-C Palliative Medicine Team [facility] Health Team Phone: [phone] Pager: [phone]

## 2012-10-12 NOTE — Progress Notes (Signed)
Physical Therapy Treatment Patient Details Name: Darlene [name] MRN: [id number] DOB: July 20, 1920 Today's Date: 10/12/2012 Time: 1914-7829 PT Time Calculation (min): 24 min  PT Assessment / Plan / Recommendation  PT Comments   Pt pleased with ambulation and performed exercises.  Pt inquiring about ambulating at home alone.  Discussed due to safety and O2 would need assist and pt agreeable.  Pt states she will d/c home tomorrow with hospice.   Follow Up Recommendations  Home health PT;Supervision/Assistance - 24 hour     Does the patient have the potential to tolerate intense rehabilitation     Barriers to Discharge        Equipment Recommendations  Rolling walker with 5" wheels;Wheelchair (measurements PT)    Recommendations for Other Services    Frequency     Progress towards PT Goals Progress towards PT goals: Progressing toward goals  Plan Current plan remains appropriate    Precautions / Restrictions Precautions Precautions: Fall   Pertinent Vitals/Pain Remained on 6L O2 [place], pt denied SOB with activity    Mobility  Bed Mobility Bed Mobility: Sit to Supine Sit to Supine: 5: Supervision;HOB flat Details for Bed Mobility Assistance: incr time Transfers Transfers: Sit to Stand;Stand to Sit Sit to Stand: 4: Min assist;With upper extremity assist;From chair/3-in-1;From toilet Stand to Sit: With upper extremity assist;To chair/3-in-1;4: Min guard;To toilet Details for Transfer Assistance: verbal cues for safe technique, assist to rise, good control of descent with cues Ambulation/Gait Ambulation/Gait Assistance: 4: Min guard Ambulation Distance (Feet): 100 Feet Assistive device: Rolling walker Ambulation/Gait Assistance Details: pt denies SOB with ambulation, stated "it feels good walking."  remained on 6L O2 [place] throughout session, verbal cues for breathing and safe use of RW (tends to let RW too far ahead) Gait Pattern: Step-to pattern;Trunk flexed;Decreased stride  length Gait velocity: decreased    Exercises General Exercises - Lower Extremity Ankle Circles/Pumps: AROM;Both;15 reps;Seated Long Arc Quad: AROM;Both;10 reps;Seated Hip ABduction/ADduction: AROM;Both;10 reps;Supine Straight Leg Raises: AROM;Both;5 reps;Supine Hip Flexion/Marching: AROM;Both;10 reps;Seated   PT Diagnosis:    PT Problem List:   PT Treatment Interventions:     PT Goals (current goals can now be found in the care plan section)    Visit Information  Last PT Received On: 10/12/12 Assistance Needed: +1    Subjective Data      Cognition  Cognition Arousal/Alertness: Awake/alert Behavior During Therapy: [facility] for tasks assessed/performed Overall Cognitive Status: Within Functional Limits for tasks assessed    Balance     End of Session PT - End of Session Equipment Utilized During Treatment: Oxygen Activity Tolerance: Patient tolerated treatment well Patient left: in bed;with call bell/phone within reach;with bed alarm set   GP     Zamere Pasternak,KATHrine E 10/12/2012, 4:04 PM Zenovia Jarred, PT, DPT 10/12/2012 Pager: [phone]

## 2012-10-12 NOTE — Progress Notes (Signed)
Subjective: [name] is sitting up in a chair on nasal cannula oxygen in no distress.  Appreciate PMT consult and Hospice consult.  Objective: Lab:  Recent Labs  10/10/12 1010 10/11/12 1035  HGB 9.1* 9.2*  HCT 28.8* 28.1*    Recent Labs  10/10/12 1010 10/12/12 0500  NA 135 139  K 3.5 3.0*  CL 91* 97  GLUCOSE 114* 100*  BUN 39* 27*  CREATININE 1.27* 1.10  CALCIUM 9.0 8.6    Imaging:  Scheduled Meds: . chlorhexidine  15 mL Mouth/Throat BID  . furosemide  40 mg Oral Daily  . pantoprazole  40 mg Oral BID AC  . senna-docusate  2 tablet Oral QHS  . sertraline  50 mg Oral Daily  . sodium chloride  3 mL Intravenous Q12H   Continuous Infusions:  PRN Meds:.sodium chloride, acetaminophen, alum & mag hydroxide-simeth, LORazepam, metoprolol, morphine CONCENTRATE, sodium chloride   Physical Exam: Filed Vitals:   10/12/12 0943  BP: 120/58  Pulse: 84  Temp: 97.7 F (36.5 C)  Resp: 18   Oxygen sat on 6 liters 99%!!!! Lungs - no rales or wheezing, no increased WOB at rest Neuro - awake, alert and at her baseline     Assessment/Plan: 1. Cardiac - very stable 2. Pulm - amazing recovery. Plan Continue oxygen sat - will try 4 liters 3. Anemia - last Hgb 9.2 very stable 4. Renal - K low, creatinine normal 5. Dispo - home with hospice July 5th   Darlene [name] IM ([phone]; (c) [phone] Call-grp - Patsi Sears IM  Tele: [phone]  10/12/2012, 10:09 AM

## 2012-10-13 LAB — BASIC METABOLIC PANEL
Calcium: 8.6 mg/dL (ref 8.4–10.5)
Chloride: 95 mEq/L — ABNORMAL LOW (ref 96–112)
Creatinine, Ser: 0.97 mg/dL (ref 0.50–1.10)
GFR calc Af Amer: 58 mL/min — ABNORMAL LOW (ref >90)

## 2012-10-13 MED ORDER — MORPHINE SULFATE (CONCENTRATE) 10 MG /0.5 ML PO SOLN: 5.0000 mg | ORAL | Status: DC | PRN

## 2012-10-13 MED ORDER — SENNOSIDES-DOCUSATE SODIUM 8.6-50 MG PO TABS: 2.0000 | ORAL_TABLET | Freq: Every day | ORAL | Status: AC

## 2012-10-13 NOTE — Progress Notes (Signed)
Subjective: Up snf ready to go  Objective: Lab:  Recent Labs  10/11/12 1035  HGB 9.2*  HCT 28.1*    Recent Labs  10/12/12 0500 10/13/12 0500  NA 139 135  K 3.0* 3.4*  CL 97 95*  GLUCOSE 100* 108*  BUN 27* 21  CREATININE 1.10 0.97  CALCIUM 8.6 8.6    Imaging:  Scheduled Meds: . chlorhexidine  15 mL Mouth/Throat BID  . furosemide  40 mg Oral Daily  . pantoprazole  40 mg Oral BID AC  . senna-docusate  2 tablet Oral QHS  . sertraline  50 mg Oral Daily  . sodium chloride  3 mL Intravenous Q12H   Continuous Infusions:  PRN Meds:.sodium chloride, acetaminophen, alum & mag hydroxide-simeth, LORazepam, metoprolol, morphine CONCENTRATE, sodium chloride   Physical Exam: Filed Vitals:   10/13/12 0932  BP: 117/58  Pulse: 82  Temp: 98 F (36.7 C)  Resp: 18    See d/c summary    Assessment/Plan:  For d/c to home with hospice. DNR with patient.  Priority dictation # [phone]   [name] [place] IM [phone]; (c) [phone] Call-grp - Patsi Sears IM  Tele: [phone]  10/13/2012, 11:06 AM

## 2012-10-13 NOTE — Discharge Summary (Signed)
Darlene [name], Darlene [name]             ACCOUNT NO.:  [id number]  MEDICAL RECORD NO.:  [id number]  LOCATION:  6703                         FACILITY:  [facility]  PHYSICIAN:  Rosalyn Gess. Norins, MD  DATE OF BIRTH:  [birth year]  DATE OF ADMISSION:  10/07/2012 DATE OF DISCHARGE:  10/13/2012                              DISCHARGE SUMMARY   ADMITTING DIAGNOSES: 1. Lower gastrointestinal bleed with profound anemia. 2. Non ST elevation myocardial infarction. 3. Hypertension. 4. Pulmonary edema.  DISCHARGE DIAGNOSES: 1. Lower gastrointestinal bleed, stable. 2. Non ST elevation myocardial infarction, stable with no     interventions. 3. Pulmonary edema, markedly improved.  Oxygen dependent. 4. Hypertension.  CONSULTANTS:  Critical Care Service at time of admission, for Cardiology Dr. Jacinto Halim who saw the patient in the emergency department.  HISTORY OF THE PRESENT ILLNESS:  Darlene [name] is a stubborn 79 year old woman with longstanding history of lower GI bleeding with several prior admissions for profound anemia with cardiac strain and non ST elevation MI.  On previous admission, she has not been felt to be a candidate for any cardiology intervention.  She was successfully treated in the past with transfusion and  support with resolution of her chest pain.  The patient presented to the emergency department with a history of shortness of breath for 2 days prior to admission and painless lower GI bleeding.  She was found in the emergency department to have a hemoglobin of 7.5 g.  Her initial troponin was negative, but she did have EKG changes.  She was referred for medical admission.  Please see the H and P for past medical history, family history, social history, and admission physical exam.  PROCEDURES AND IMAGING: 1. Chest x-ray on June 29 with mild interstitial edema with small     bilateral pleural effusions.  Bibasilar airspace disease, likely     atelectasis. 2. Chest x-ray June 30 which  was read out as showing worsening CHF     with pulmonary vascular congestion present and cardiomegaly. 3. Chest x-ray July 2, which showed congestive heart failure  that     might be slightly increased.  Superimposed atelectasis versus     consolidation of left lower lobe could not be ruled out. 4. Chest x-ray July 3, which showed persistent diffuse airspace     disease, most consistent with edema and effusions.  Atelectasis at     the left lung base.  HOSPITAL COURSE: 1. Anemia, the patient was transfused 2 units of packed red cells with     a rise in hemoglobin to 9.1 g.  She remained stable.  She did have     several more episodes of hematochezia of low volume but her     hemoglobin did remain stable.  No further transfusions were     required. 2. Cardiovascular, patient with a non-ST elevation MI.  Her chest pain     and discomfort resolved after transfusion.  Dr. Jacinto Halim has seen the     patient in the emergency department and felt that there was no     indication for intervention.  It was not felt that echocardiogram     would be of help in changing  her management with the last study     being May 24, 2012, which revealed an ejection fraction of 35%     to 40% with diffuse hypokinesis.  Severe hypokinesis of the entire     lateral and inferolateral myocardial consistent with prior     infarction.  There was aneurysmal deformity of the basilar inferior     myocardium.  Doppler parameters were consistent with grade 1     diastolic dysfunction. Patient's non STEMI often may have significantly increased her     myocardial damage worsening her pulmonary edema/congestive heart     failure.  The patient denied any recurrent chest pain or chest     discomfort, and no additional medications were needed other than     her prior home medications. 3. Pulmonary, patient presented with acute pulmonary edema.  She was     never intubated but did require BiPAP support.  She was very slow      to improve but was stable enough to be moved out of the ICU to     regular telemetry floor.  Over the next several days, she was     slowly able to be weaned to high-flow nasal cannula oxygen.  The     patient did have significant hypoxemia with exercise without     oxygen.  Her oxygen saturation would drop into the low 70% range.     The patient did continue to do well on nasal cannula and was     saturating at 100% on 6 L of nasal cannula.  The patient will be     discharged home on home oxygen and the intent will be to titrate     down her FiO2 to maintain oxygen saturations above 85%.     Arrangements have been made for oxygen concentrator to be at the     home.  The patient will require ambulance transport to continue     oxygen therapy in transition to home. 4. End of life care.  During this hospitalization, after multiple     conversations with the patient and her son, she is not a candidate     for cardiac or pulmonary resuscitation, not a candidate for     mechanical intubation.  The patient still wishes to have treatment     for anemia and transfusions as needed as well as hospitalization     for care if needed.  She was seen by the palliative medicine team     and was felt to be a good candidate for hospice.  Hospice     consultation was obtained and patient was felt to meet criteria  as     a hospice patient.  She will be followed as an outpatient by     [facility].  Appropriate out of     facility order is sent with the patient at discharge.  Over time,     we will continue to have discussions with her about end of life     care, including issues of recurrent and repeat transfusions or     repeat hospitalizations.  With the patient's critical problems being stabilized with her     mental status being at her baseline she at this point is ready     for transfer to home and follow up with [facility]  [place].  DISCHARGE EXAMINATION:  VITAL SIGNS:  Temperature was 98,  blood pressure 117/58, heart rate was 82, respirations 18, and oxygen saturation on 6 L was 100%. GENERAL APPEARANCE:  This is a very elderly ill kempt woman in no acute distress.  Anxious to go home. HEENT:  Conjunctivae and sclerae were clear.  She does have a basal cell carcinoma on the medial aspect of the inferior lead on the left eye. Pupils were equal, round, and reactive.  Oropharynx with poor native dentition with no active lesions. NECK:  Supple.  In the sitting position, there was no JVD. PULMONARY:  The patient is moving air well.  She is wearing nasal cannula oxygen.  She had no rales or wheezes on exam but her inspirations are shallow. CARDIOVASCULAR:  Radial pulse 2+.  Her precordium was quiet.  She had a regular rate and rhythm. BREAST:  Deferred. ABDOMEN:  Had positive bowel sounds.  Soft.  No guarding or rebound was noted. GENITALIA/RECTAL:  Deferred. EXTREMITIES:  Without deformity. NEUROLOGIC:  The patient is awake and alert.  She is very hard of hearing, which is chronic.  Cranial nerves II through XII are grossly intact with normal facial symmetry and normal motor strength, although she was not stood or ambulated.  No cerebellar abnormalities were noted.  FINAL LABORATORY:  On the day of discharge, sodium was 135, potassium 3.4, chloride 95, CO2 of 35, BUN 21, creatinine 0.97, and glucose 108. Final hemoglobin from July 3 was 9.2 g.  Cardiac enzymes this admission were with troponins that were negative x4.  Final pro-BNP on October 07, 2012 was 5943.  Thyroid function was checked this admission with a TSH of 1.8.  Disposition is to be home with hospice and arrangements have been made for in home equipment including oxygen and bedside commode.  The patient does have a problem with transportation and so she will not be able to get to the office and will need to have home visit for  followup. Oxygen at 6 L per nasal cannula to be titrated down as tolerated to maintain oxygen saturation 85% or higher.  DISCHARGE MEDICATIONS: 1. Norvasc 10 mg daily. 2. Aspirin 81 mg daily. 3. Nexium 40 mg q.a.m. 4. Zetia 10 mg daily. 5. Lasix 40 mg daily. 6. Norco 5/325, 1 tablet daily as needed for pain or discomfort. 7. Lorazepam 0.5 mg b.i.d. 8. Metoprolol succinate 100 mg 24-hour 1 tablet daily. 9. Roxanol 10 mg per 5 mL 5 mg sublingual q.4 hours p.r.n. anxiety and     respiratory distress. 10.Multivitamins daily. 11.Nitroglycerin 0.4 mg sublingual q.5 minutes p.r.n. chest pain. 12.Of note, we will discontinue Protonix. 13.Pravachol 80 mg daily. 14.Ramipril 10 mg daily. 15.Requip 0.5 mg at bedtime for restless legs syndrome. 16.Senokot 2 tablets at bedtime. 17.Sertraline 50 mg p.o. daily. 18.Hytrin 1 mg p.o. at bedtime. 19.Triamcinolone 0.1% cream, apply as needed for itching.  DISPOSITION:  The patient to be transferred by ambulance.  Hospice will be seeing her at the time of discharge and follow up.  Dr. Debby Bud will be her hospice attending with comanagement with hospice physicians.  The patient will be seen at home for followup within 7-10 days, at which time, we will review her medications and consider discontinuation of nonessential medications.  The patient's condition at time of discharge dictation is medically stable and improved with a very guarded prognosis given her advanced age and multiple comorbidities.  Code status is DNR.     Rosalyn Gess Norins, MD     MEN/MEDQ  D:  10/13/2012  T:  10/13/2012  Job:  [id number]

## 2012-10-17 ENCOUNTER — Other Ambulatory Visit: Payer: Self-pay | Admitting: Internal Medicine

## 2012-10-18 NOTE — Telephone Encounter (Signed)
Lorazepam called to pharmacy  

## 2012-10-24 ENCOUNTER — Other Ambulatory Visit: Payer: Self-pay | Admitting: Internal Medicine

## 2012-10-24 NOTE — Consult Note (Signed)
I have reviewed and discussed the care of this patient in detail with the nurse practitioner including pertinent patient records, physical exam findings and data. I agree with details of this encounter.  

## 2012-10-25 ENCOUNTER — Other Ambulatory Visit: Payer: Self-pay | Admitting: Internal Medicine

## 2012-10-25 MED ORDER — HYDROCODONE-ACETAMINOPHEN 5-325 MG PO TABS: ORAL_TABLET | ORAL | Status: DC

## 2012-10-25 NOTE — Telephone Encounter (Signed)
Pt request refill Hydrocodone. Please call pt back, drug store request couple time.

## 2012-10-25 NOTE — Telephone Encounter (Signed)
Hydrocodone called to pharmacy  

## 2012-10-25 NOTE — Telephone Encounter (Signed)
Okay to refill? 

## 2012-11-05 ENCOUNTER — Ambulatory Visit: Payer: Medicare Other | Admitting: Internal Medicine

## 2012-11-07 ENCOUNTER — Ambulatory Visit: Payer: Medicare Other | Admitting: Internal Medicine

## 2012-11-09 ENCOUNTER — Telehealth: Payer: Self-pay | Admitting: *Deleted

## 2012-11-09 NOTE — Telephone Encounter (Signed)
Ok to change Oxygen to prn use. If she remains stable may d/c oxygen in 1 month

## 2012-11-09 NOTE — Telephone Encounter (Signed)
Spoke with Mordecai Maes advised of MD order.

## 2012-11-09 NOTE — Telephone Encounter (Signed)
Therese from Hospice called states pt is doing extremely well.  Reports pt's O2 on 2 liters at 99%; room air at 96%; after walk at 94%.  No shortness of breath, lungs are clear.  She is requesting PRN O2 for shortness of breath.

## 2012-11-13 ENCOUNTER — Encounter: Payer: Self-pay | Admitting: Internal Medicine

## 2012-11-13 ENCOUNTER — Ambulatory Visit (INDEPENDENT_AMBULATORY_CARE_PROVIDER_SITE_OTHER): Payer: Medicare Other | Admitting: Internal Medicine

## 2012-11-13 ENCOUNTER — Other Ambulatory Visit (INDEPENDENT_AMBULATORY_CARE_PROVIDER_SITE_OTHER): Payer: Medicare Other

## 2012-11-13 VITALS — BP 106/50 | HR 75 | Temp 97.7°F | Wt 113.0 lb

## 2012-11-13 DIAGNOSIS — D649 Anemia, unspecified: Secondary | ICD-10-CM

## 2012-11-13 DIAGNOSIS — I10 Essential (primary) hypertension: Secondary | ICD-10-CM

## 2012-11-13 DIAGNOSIS — I2510 Atherosclerotic heart disease of native coronary artery without angina pectoris: Secondary | ICD-10-CM

## 2012-11-13 DIAGNOSIS — K922 Gastrointestinal hemorrhage, unspecified: Secondary | ICD-10-CM

## 2012-11-13 DIAGNOSIS — Z515 Encounter for palliative care: Secondary | ICD-10-CM

## 2012-11-13 LAB — COMPREHENSIVE METABOLIC PANEL
ALT: 14 U/L (ref 0–35)
AST: 28 U/L (ref 0–37)
Albumin: 3.7 g/dL (ref 3.5–5.2)
BUN: 17 mg/dL (ref 6–23)
Calcium: 9.2 mg/dL (ref 8.4–10.5)
Chloride: 99 mEq/L (ref 96–112)
Potassium: 3.8 mEq/L (ref 3.5–5.1)

## 2012-11-13 NOTE — Assessment & Plan Note (Signed)
BP Readings from Last 3 Encounters:  11/13/12 106/50  10/13/12 117/58  07/16/12 138/64   OK BP control.

## 2012-11-13 NOTE — Assessment & Plan Note (Signed)
S/p several NSTEMI events. She did have difficult to treat CHF due to cardiomyopathy, but she has done well. Currently pain free with no signs of decompensation. She has been loosing weight  Plan Continue present medical regimen

## 2012-11-13 NOTE — Patient Instructions (Addendum)
Good to see you.   Your exam today reveals clear lungs and that you appear to be doing well. Your weight is down a little.  Will check your blood counts today.  No change in your medication.

## 2012-11-13 NOTE — Assessment & Plan Note (Signed)
Hospice is seeing her routinely. She is doing better and is off oxygen. Her prognosis at age 79 with her medical problems is poor.   Plan Continue hospice care.

## 2012-11-13 NOTE — Progress Notes (Signed)
Subjective:    Patient ID: Darlene [name], female    DOB: 14-Jun-1920, 79 y.o.   MRN: [id number]  HPI Darlene [name] presents for follow up. She was recently hospitalized with LGI bleed and STEMI. She had marked hearet failure with pulmonary edema that was difficult to treat. She did require oxygen at the time of discharge.  Since being home she has done well. She is follow by hospice and palliative care of [facility]. She has been able to come off oxygen. She has not had any recurrent bleeding or recurrent CHF. She reports she feels well.  Past Medical History  Diagnosis Date  . UTI (urinary tract infection)   . IBS (irritable bowel syndrome)   . Abdominal pain, left lower quadrant   . CAD (coronary artery disease)   . Pneumonia   . HTN (hypertension)   . Hyperlipidemia   . Diverticulosis of colon   . History of colon cancer   . Allergic rhinitis   . Angina   . Cancer   . Arthritis   . Shortness of breath   . Anemia   . Blood transfusion   . Anxiety   . CHF (congestive heart failure)   . GERD (gastroesophageal reflux disease)   . Peripheral vascular disease    Past Surgical History  Procedure Laterality Date  . Hemicolectomy  1991    Right for mgt of colon cancer.  . Angioplasty  1989    Stent x 2  . Appendectomy    . Bilateral salpingoophorectomy    . Breast surgery      benign  . Orif hip fracture  12/2009    Dr Charlann Boxer  . Esophagogastroduodenoscopy  08/09/2011    Procedure: ESOPHAGOGASTRODUODENOSCOPY (EGD);  Surgeon: Hart Carwin, MD;  Location[facility] ENDOSCOPY;  Service: Endoscopy;  Laterality: N/A;  . Colonoscopy  10/24/2011    Procedure: COLONOSCOPY;  Surgeon: Beverley Fiedler, MD;  Location: [facility] ENDOSCOPY;  Service: Gastroenterology;  Laterality: N/A;  . Colon surgery     Family History  Problem Relation Age of Onset  . Colon cancer Neg Hx   . Breast cancer Neg Hx    History   Social History  . Marital Status: Legally Separated    Spouse Name: N/A    Number of  Children: N/A  . Years of Education: N/A   Occupational History  . Retired    Social History Main Topics  . Smoking status: Never Smoker   . Smokeless tobacco: Not on file  . Alcohol Use: No  . Drug Use: No  . Sexually Active: No   Other Topics Concern  . Not on file   Social History Narrative   PHYSICIAN ROSTER   Cardiology - Dr Jacinto Halim   GI - Dr Jeralene Huff - Dr Caprice Renshaw, Son lives w/her and has multiple medical problems. She has family in [place] - a nephew was recently murdered.      Daily Caffeine - Cokes      END OF LIFE CARE: Patient hesitant to decide "I will decide at the time." thus she is a full code. She does not want to be a vegetable.     Current Outpatient Prescriptions on File Prior to Visit  Medication Sig Dispense Refill  . amLODipine (NORVASC) 10 MG tablet Take 1 tablet (10 mg total) by mouth daily.  30 tablet  5  . aspirin 81 MG chewable tablet Chew 1  tablet (81 mg total) by mouth daily.  30 tablet  5  . esomeprazole (NEXIUM) 40 MG capsule Take 1 capsule (40 mg total) by mouth daily.  30 capsule  5  . ezetimibe (ZETIA) 10 MG tablet Take 1 tablet (10 mg total) by mouth daily.  30 tablet  2  . furosemide (LASIX) 40 MG tablet Take 1 tablet (40 mg total) by mouth daily.  30 tablet  5  . HYDROcodone-acetaminophen (NORCO/VICODIN) 5-325 MG per tablet TAKE 1 TABLET TWICE DAILY AS NEEDED FOR PAIN.  60 tablet  0  . LORazepam (ATIVAN) 0.5 MG tablet TAKE 1 TABLET BY MOUTH TWICE DAILY  60 tablet  0  . metoprolol succinate (TOPROL-XL) 100 MG 24 hr tablet Take 1 tablet (100 mg total) by mouth daily.  30 tablet  5  . Morphine Sulfate (MORPHINE CONCENTRATE) 10 mg / 0.5 ml concentrated solution Place 0.25 mLs (5 mg total) under the tongue every 4 (four) hours as needed.  30 mL  0  . Multiple Vitamins-Minerals (CENTRUM SILVER) tablet Take 1 tablet by mouth daily.  30 tablet  5  . nitroGLYCERIN (NITROSTAT) 0.4 MG SL tablet Place 1 tablet (0.4 mg total) under the  tongue every 5 (five) minutes as needed. For chest pain  30 tablet  5  . pravastatin (PRAVACHOL) 80 MG tablet Take 1 tablet (80 mg total) by mouth daily.  30 tablet  5  . ramipril (ALTACE) 10 MG tablet Take 1 tablet (10 mg total) by mouth daily.  30 tablet  5  . rOPINIRole (REQUIP) 0.5 MG tablet Take 1 tablet (0.5 mg total) by mouth at bedtime.  30 tablet  6  . senna-docusate (SENOKOT-S) 8.6-50 MG per tablet Take 2 tablets by mouth at bedtime.  60 tablet  11  . sertraline (ZOLOFT) 50 MG tablet Take 1 tablet (50 mg total) by mouth daily.  30 tablet  5  . terazosin (HYTRIN) 1 MG capsule Take 1 capsule (1 mg total) by mouth at bedtime.  30 capsule  5  . triamcinolone cream (KENALOG) 0.1 % Apply 1 application topically 2 (two) times daily as needed (itching).  30 g  5   No current facility-administered medications on file prior to visit.      Review of Systems System review is negative for any constitutional, cardiac, pulmonary, GI or neuro symptoms or complaints other than as described in the HPI.     Objective:   Physical Exam Filed Vitals:   11/13/12 1031  BP: 106/50  Pulse: 75  Temp: 97.7 F (36.5 C)   Wt Readings from Last 3 Encounters:  11/13/12 113 lb (51.256 kg)  10/12/12 121 lb 7.6 oz (55.1 kg)  07/16/12 122 lb (55.339 kg)   Gen'l- progressive cachectic white woman in no distress. Required 1+ assist to get up to exam table and her gait is unsteady.  HEENT - C&S clear, no change in lesion on left lower eyelid Cor - 2+ radial, 1+ DP, RRR Pulm - no increased WOB, lungs CTAP Neuro - a&Ox 3, weak and needs assist with ambulation.   Recent Results (from the past 2160 hour(s))  CBC     Status: Abnormal   Collection Time    10/07/12 12:41 AM      Result Value Range   WBC 9.0  4.0 - 10.5 K/uL   RBC 2.61 (*) 3.87 - 5.11 MIL/uL   Hemoglobin 7.4 (*) 12.0 - 15.0 g/dL   HCT 16.1 (*) 09.6 - 04.5 %  MCV 88.5  78.0 - 100.0 fL   MCH 28.4  26.0 - 34.0 pg   MCHC 32.0  30.0 - 36.0  g/dL   RDW 16.1  09.6 - 04.5 %   Platelets 124 (*) 150 - 400 K/uL  COMPREHENSIVE METABOLIC PANEL     Status: Abnormal   Collection Time    10/07/12 12:41 AM      Result Value Range   Sodium 133 (*) 135 - 145 mEq/L   Potassium 3.4 (*) 3.5 - 5.1 mEq/L   Chloride 98  96 - 112 mEq/L   CO2 25  19 - 32 mEq/L   Glucose, Bld 191 (*) 70 - 99 mg/dL   BUN 21  6 - 23 mg/dL   Creatinine, Ser 4.09 (*) 0.50 - 1.10 mg/dL   Calcium 8.2 (*) 8.4 - 10.5 mg/dL   Total Protein 6.1  6.0 - 8.3 g/dL   Albumin 3.2 (*) 3.5 - 5.2 g/dL   AST 19  0 - 37 U/L   ALT 9  0 - 35 U/L   Alkaline Phosphatase 81  39 - 117 U/L   Total Bilirubin 0.5  0.3 - 1.2 mg/dL   GFR calc non Af Amer 36 (*) >90 mL/min   GFR calc Af Amer 42 (*) >90 mL/min   Comment:            The eGFR has been calculated     using the CKD EPI equation.     This calculation has not been     validated in all clinical     situations.     eGFR's persistently     <90 mL/min signify     possible Chronic Kidney Disease.  POCT I-STAT TROPONIN I     Status: None   Collection Time    10/07/12 12:50 AM      Result Value Range   Troponin i, poc 0.02  0.00 - 0.08 ng/mL   Comment 3            Comment: Due to the release kinetics of cTnI,     a negative result within the first hours     of the onset of symptoms does not rule out     myocardial infarction with certainty.     If myocardial infarction is still suspected,     repeat the test at appropriate intervals.  POCT I-STAT, CHEM 8     Status: Abnormal   Collection Time    10/07/12 12:53 AM      Result Value Range   Sodium 136  135 - 145 mEq/L   Potassium 3.6  3.5 - 5.1 mEq/L   Chloride 99  96 - 112 mEq/L   BUN 22  6 - 23 mg/dL   Creatinine, Ser 8.11 (*) 0.50 - 1.10 mg/dL   Glucose, Bld 914 (*) 70 - 99 mg/dL   Calcium, Ion 7.82 (*) 1.13 - 1.30 mmol/L   TCO2 27  0 - 100 mmol/L   Hemoglobin 7.5 (*) 12.0 - 15.0 g/dL   HCT 95.6 (*) 21.3 - 08.6 %  TYPE AND SCREEN     Status: None   Collection  Time    10/07/12  1:37 AM      Result Value Range   ABO/RH(D) A POS     Antibody Screen NEG     Sample Expiration 10/10/2012     Unit Number [id number]     Blood Component Type RED CELLS,LR  Unit division 00     Status of Unit ISSUED,FINAL     Transfusion Status OK TO TRANSFUSE     Crossmatch Result Compatible    OCCULT BLOOD, POC DEVICE     Status: Abnormal   Collection Time    10/07/12  1:40 AM      Result Value Range   Fecal Occult Bld POSITIVE (*) NEGATIVE  MRSA PCR SCREENING     Status: None   Collection Time    10/07/12  3:59 AM      Result Value Range   MRSA by PCR NEGATIVE  NEGATIVE   Comment:            The GeneXpert MRSA Assay (FDA     approved for NASAL specimens     only), is one component of a     comprehensive MRSA colonization     surveillance program. It is not     intended to diagnose MRSA     infection nor to guide or     monitor treatment for     MRSA infections.  PREPARE RBC (CROSSMATCH)     Status: None   Collection Time    10/07/12  4:00 AM      Result Value Range   Order Confirmation ORDER PROCESSED BY BLOOD BANK    BASIC METABOLIC PANEL     Status: Abnormal   Collection Time    10/07/12  5:11 AM      Result Value Range   Sodium 134 (*) 135 - 145 mEq/L   Potassium 4.0  3.5 - 5.1 mEq/L   Chloride 98  96 - 112 mEq/L   CO2 26  19 - 32 mEq/L   Glucose, Bld 138 (*) 70 - 99 mg/dL   BUN 22  6 - 23 mg/dL   Creatinine, Ser 9.56 (*) 0.50 - 1.10 mg/dL   Calcium 8.5  8.4 - 21.3 mg/dL   GFR calc non Af Amer 34 (*) >90 mL/min   GFR calc Af Amer 40 (*) >90 mL/min   Comment:            The eGFR has been calculated     using the CKD EPI equation.     This calculation has not been     validated in all clinical     situations.     eGFR's persistently     <90 mL/min signify     possible Chronic Kidney Disease.  CBC     Status: Abnormal   Collection Time    10/07/12  5:11 AM      Result Value Range   WBC 9.2  4.0 - 10.5 K/uL   RBC 2.64 (*) 3.87  - 5.11 MIL/uL   Hemoglobin 7.4 (*) 12.0 - 15.0 g/dL   HCT 08.6 (*) 57.8 - 46.9 %   MCV 87.9  78.0 - 100.0 fL   MCH 28.0  26.0 - 34.0 pg   MCHC 31.9  30.0 - 36.0 g/dL   RDW 62.9  52.8 - 41.3 %   Platelets 135 (*) 150 - 400 K/uL  TROPONIN I     Status: None   Collection Time    10/07/12  5:11 AM      Result Value Range   Troponin I <0.30  <0.30 ng/mL   Comment:            Due to the release kinetics of cTnI,     a negative result within the first hours  of the onset of symptoms does not rule out     myocardial infarction with certainty.     If myocardial infarction is still suspected,     repeat the test at appropriate intervals.  TSH     Status: None   Collection Time    10/07/12  5:11 AM      Result Value Range   TSH 1.849  0.350 - 4.500 uIU/mL  PRO B NATRIURETIC PEPTIDE     Status: Abnormal   Collection Time    10/07/12  5:11 AM      Result Value Range   Pro B Natriuretic peptide (BNP) 5943.0 (*) 0 - 450 pg/mL  HEMOGLOBIN AND HEMATOCRIT, BLOOD     Status: Abnormal   Collection Time    10/07/12 11:46 AM      Result Value Range   Hemoglobin 9.0 (*) 12.0 - 15.0 g/dL   Comment: POST TRANSFUSION SPECIMEN   HCT 27.2 (*) 36.0 - 46.0 %  TROPONIN I     Status: None   Collection Time    10/07/12 11:47 AM      Result Value Range   Troponin I <0.30  <0.30 ng/mL   Comment:            Due to the release kinetics of cTnI,     a negative result within the first hours     of the onset of symptoms does not rule out     myocardial infarction with certainty.     If myocardial infarction is still suspected,     repeat the test at appropriate intervals.  TROPONIN I     Status: Abnormal   Collection Time    10/07/12  3:49 PM      Result Value Range   Troponin I 0.47 (*) <0.30 ng/mL   Comment:            Due to the release kinetics of cTnI,     a negative result within the first hours     of the onset of symptoms does not rule out     myocardial infarction with certainty.      If myocardial infarction is still suspected,     repeat the test at appropriate intervals.     CRITICAL RESULT CALLED TO, READ BACK BY AND VERIFIED WITH:     Deboraha Sprang [id number] 10/07/12 SCALES H  HEMOGLOBIN AND HEMATOCRIT, BLOOD     Status: Abnormal   Collection Time    10/07/12  8:06 PM      Result Value Range   Hemoglobin 9.3 (*) 12.0 - 15.0 g/dL   HCT 14.7 (*) 82.9 - 56.2 %  BASIC METABOLIC PANEL     Status: Abnormal   Collection Time    10/08/12  4:10 AM      Result Value Range   Sodium 138  135 - 145 mEq/L   Potassium 3.4 (*) 3.5 - 5.1 mEq/L   Chloride 100  96 - 112 mEq/L   CO2 31  19 - 32 mEq/L   Glucose, Bld 119 (*) 70 - 99 mg/dL   BUN 24 (*) 6 - 23 mg/dL   Creatinine, Ser 1.30 (*) 0.50 - 1.10 mg/dL   Calcium 8.6  8.4 - 86.5 mg/dL   GFR calc non Af Amer 38 (*) >90 mL/min   GFR calc Af Amer 44 (*) >90 mL/min   Comment:            The eGFR has  been calculated     using the CKD EPI equation.     This calculation has not been     validated in all clinical     situations.     eGFR's persistently     <90 mL/min signify     possible Chronic Kidney Disease.  CBC     Status: Abnormal   Collection Time    10/08/12  4:10 AM      Result Value Range   WBC 9.6  4.0 - 10.5 K/uL   RBC 3.15 (*) 3.87 - 5.11 MIL/uL   Hemoglobin 9.1 (*) 12.0 - 15.0 g/dL   HCT 16.1 (*) 09.6 - 04.5 %   MCV 87.3  78.0 - 100.0 fL   MCH 28.9  26.0 - 34.0 pg   MCHC 33.1  30.0 - 36.0 g/dL   RDW 40.9  81.1 - 91.4 %   Platelets 137 (*) 150 - 400 K/uL  BASIC METABOLIC PANEL     Status: Abnormal   Collection Time    10/09/12  3:57 AM      Result Value Range   Sodium 138  135 - 145 mEq/L   Potassium 3.4 (*) 3.5 - 5.1 mEq/L   Chloride 98  96 - 112 mEq/L   CO2 34 (*) 19 - 32 mEq/L   Glucose, Bld 110 (*) 70 - 99 mg/dL   BUN 28 (*) 6 - 23 mg/dL   Creatinine, Ser 7.82 (*) 0.50 - 1.10 mg/dL   Calcium 9.0  8.4 - 95.6 mg/dL   GFR calc non Af Amer 41 (*) >90 mL/min   GFR calc Af Amer 47 (*) >90 mL/min    Comment:            The eGFR has been calculated     using the CKD EPI equation.     This calculation has not been     validated in all clinical     situations.     eGFR's persistently     <90 mL/min signify     possible Chronic Kidney Disease.  BASIC METABOLIC PANEL     Status: Abnormal   Collection Time    10/10/12 10:10 AM      Result Value Range   Sodium 135  135 - 145 mEq/L   Potassium 3.5  3.5 - 5.1 mEq/L   Chloride 91 (*) 96 - 112 mEq/L   CO2 27  19 - 32 mEq/L   Glucose, Bld 114 (*) 70 - 99 mg/dL   BUN 39 (*) 6 - 23 mg/dL   Creatinine, Ser 2.13 (*) 0.50 - 1.10 mg/dL   Calcium 9.0  8.4 - 08.6 mg/dL   GFR calc non Af Amer 36 (*) >90 mL/min   GFR calc Af Amer 42 (*) >90 mL/min   Comment:            The eGFR has been calculated     using the CKD EPI equation.     This calculation has not been     validated in all clinical     situations.     eGFR's persistently     <90 mL/min signify     possible Chronic Kidney Disease.  HEMOGLOBIN AND HEMATOCRIT, BLOOD     Status: Abnormal   Collection Time    10/10/12 10:10 AM      Result Value Range   Hemoglobin 9.1 (*) 12.0 - 15.0 g/dL   HCT 57.8 (*) 46.9 - 62.9 %  HEMOGLOBIN AND HEMATOCRIT, BLOOD     Status: Abnormal   Collection Time    10/11/12 10:35 AM      Result Value Range   Hemoglobin 9.2 (*) 12.0 - 15.0 g/dL   HCT 78.2 (*) 95.6 - 21.3 %  BASIC METABOLIC PANEL     Status: Abnormal   Collection Time    10/12/12  5:00 AM      Result Value Range   Sodium 139  135 - 145 mEq/L   Potassium 3.0 (*) 3.5 - 5.1 mEq/L   Chloride 97  96 - 112 mEq/L   CO2 33 (*) 19 - 32 mEq/L   Glucose, Bld 100 (*) 70 - 99 mg/dL   BUN 27 (*) 6 - 23 mg/dL   Creatinine, Ser 0.86  0.50 - 1.10 mg/dL   Calcium 8.6  8.4 - 57.8 mg/dL   GFR calc non Af Amer 43 (*) >90 mL/min   GFR calc Af Amer 50 (*) >90 mL/min   Comment:            The eGFR has been calculated     using the CKD EPI equation.     This calculation has not been     validated in  all clinical     situations.     eGFR's persistently     <90 mL/min signify     possible Chronic Kidney Disease.  BASIC METABOLIC PANEL     Status: Abnormal   Collection Time    10/13/12  5:00 AM      Result Value Range   Sodium 135  135 - 145 mEq/L   Potassium 3.4 (*) 3.5 - 5.1 mEq/L   Chloride 95 (*) 96 - 112 mEq/L   CO2 35 (*) 19 - 32 mEq/L   Glucose, Bld 108 (*) 70 - 99 mg/dL   BUN 21  6 - 23 mg/dL   Creatinine, Ser 4.69  0.50 - 1.10 mg/dL   Calcium 8.6  8.4 - 62.9 mg/dL   GFR calc non Af Amer 50 (*) >90 mL/min   GFR calc Af Amer 58 (*) >90 mL/min   Comment:            The eGFR has been calculated     using the CKD EPI equation.     This calculation has not been     validated in all clinical     situations.     eGFR's persistently     <90 mL/min signify     possible Chronic Kidney Disease.  COMPREHENSIVE METABOLIC PANEL     Status: Abnormal   Collection Time    11/13/12 11:09 AM      Result Value Range   Sodium 136  135 - 145 mEq/L   Potassium 3.8  3.5 - 5.1 mEq/L   Chloride 99  96 - 112 mEq/L   CO2 30  19 - 32 mEq/L   Glucose, Bld 109 (*) 70 - 99 mg/dL   BUN 17  6 - 23 mg/dL   Creatinine, Ser 1.1  0.4 - 1.2 mg/dL   Total Bilirubin 0.6  0.3 - 1.2 mg/dL   Alkaline Phosphatase 69  39 - 117 U/L   AST 28  0 - 37 U/L   ALT 14  0 - 35 U/L   Total Protein 6.8  6.0 - 8.3 g/dL   Albumin 3.7  3.5 - 5.2 g/dL   Calcium 9.2  8.4 - 52.8 mg/dL   GFR 41.32 (*) >  60.00 mL/min  HEMOGLOBIN AND HEMATOCRIT, BLOOD     Status: Abnormal   Collection Time    11/13/12 11:09 AM      Result Value Range   Hemoglobin 9.0 (*) 12.0 - 15.0 g/dL   HCT 16.1 (*) 09.6 - 04.5 %       Assessment & Plan:

## 2012-11-13 NOTE — Assessment & Plan Note (Signed)
No report of recurrent bleeding. Hgb is stable at 9 g

## 2012-11-14 NOTE — Progress Notes (Signed)
Visit note has been faxed to [facility] [place] per dr Debby Bud

## 2012-11-16 ENCOUNTER — Encounter: Payer: Self-pay | Admitting: Internal Medicine

## 2012-11-19 ENCOUNTER — Other Ambulatory Visit: Payer: Self-pay | Admitting: Internal Medicine

## 2012-11-19 NOTE — Telephone Encounter (Signed)
Lorazepam called to pharmacy  

## 2012-11-20 NOTE — Telephone Encounter (Signed)
Hydrocodone sent to pharmacy

## 2012-11-26 ENCOUNTER — Encounter: Payer: Self-pay | Admitting: Internal Medicine

## 2012-12-17 ENCOUNTER — Telehealth: Payer: Self-pay

## 2012-12-17 ENCOUNTER — Other Ambulatory Visit: Payer: Self-pay | Admitting: Internal Medicine

## 2012-12-17 NOTE — Telephone Encounter (Signed)
Elijah Birk from Hospice called requesting a Lorazepam refill to be sent to [facility] on [address].  Please advise

## 2012-12-17 NOTE — Telephone Encounter (Signed)
Lorazepam script faxed to [facility] on [address] [phone]

## 2012-12-19 ENCOUNTER — Telehealth: Payer: Self-pay | Admitting: *Deleted

## 2012-12-19 MED ORDER — ROPINIROLE HCL 1 MG PO TABS: 0.5000 mg | ORAL_TABLET | Freq: Every day | ORAL | Status: DC

## 2012-12-19 NOTE — Telephone Encounter (Signed)
Amy from Hospice called requesting an increase in pts Ropinirole rx to assist with Restless leg.

## 2012-12-19 NOTE — Telephone Encounter (Signed)
Spoke with Amy advised of MDs message

## 2012-12-19 NOTE — Telephone Encounter (Signed)
History of Lower GI bleeds. If there is only scant blood with BM recommend treatment for hemorrhoids - otc meds are fine. For large volume blood loss will need lab-H/H

## 2012-12-19 NOTE — Telephone Encounter (Signed)
Amy from Hospice and [facility] called states pt reports having a small amount of rectal bleeding.  Please advise

## 2012-12-19 NOTE — Telephone Encounter (Signed)
Ok to increase to 1 mg qHS. New Rx sent in

## 2012-12-20 NOTE — Telephone Encounter (Signed)
Left detailed message on Darlene [name]'s VM

## 2013-01-03 ENCOUNTER — Telehealth: Payer: Self-pay

## 2013-01-03 NOTE — Telephone Encounter (Signed)
1. For rhinorrhea - ok to take loratadine 10 mg once a day  2. For anxiety - if still very anxious recommend increasing sertraline to 100 mg daily  3. Hydrocodone/APAP - for pain??? Ok to renew

## 2013-01-03 NOTE — Telephone Encounter (Signed)
Phone call from I believe her name is Aggie Cosier with Hospice and [facility] [phone]. States patient has nasal congestion with drip, wheezing but not in the lungs. Her oxygen level is 97% while walking with no oxygen. She weighs 112 pounds. Patient is very anxious and she is taking Ativan 0.5 mg but still anxious and has chest pressure when anxious. She's also arguing with her son a lot. Requests for refill on Hydrocodone to [facility] on [address].

## 2013-01-04 ENCOUNTER — Other Ambulatory Visit: Payer: Self-pay | Admitting: *Deleted

## 2013-01-04 MED ORDER — SERTRALINE HCL 100 MG PO TABS: 100.0000 mg | ORAL_TABLET | Freq: Every day | ORAL | Status: DC

## 2013-01-04 MED ORDER — HYDROCODONE-ACETAMINOPHEN 5-325 MG PO TABS: ORAL_TABLET | ORAL | Status: DC

## 2013-01-04 MED ORDER — LORATADINE 10 MG PO TABS: 10.0000 mg | ORAL_TABLET | Freq: Every day | ORAL | Status: DC

## 2013-01-04 MED ORDER — SERTRALINE HCL 50 MG PO TABS: 100.0000 mg | ORAL_TABLET | Freq: Every day | ORAL | Status: DC

## 2013-01-04 NOTE — Telephone Encounter (Signed)
rx sent spoke with Teress advised of same.

## 2013-01-09 ENCOUNTER — Telehealth: Payer: Self-pay | Admitting: Internal Medicine

## 2013-01-09 NOTE — Telephone Encounter (Signed)
Therese from [facility] and [facility] called requesting Lorazepam 0.5mg  refill.  Further states pt only has 8 tablets left.  Please advise

## 2013-01-09 NOTE — Telephone Encounter (Signed)
Ok for refill x 5 

## 2013-01-10 ENCOUNTER — Telehealth: Payer: Self-pay

## 2013-01-10 ENCOUNTER — Other Ambulatory Visit: Payer: Self-pay

## 2013-01-10 MED ORDER — NITROGLYCERIN 0.4 MG SL SUBL: 0.4000 mg | SUBLINGUAL_TABLET | SUBLINGUAL | Status: DC | PRN

## 2013-01-10 MED ORDER — LORAZEPAM 0.5 MG PO TABS: ORAL_TABLET | ORAL | Status: DC

## 2013-01-10 NOTE — Telephone Encounter (Signed)
Spoke with Jaymes Graff advised Rx sent

## 2013-01-10 NOTE — Telephone Encounter (Signed)
Phone call from Aggie Cosier [phone] with Hospice and [facility] [place]. She wanted you to be aware of patient's increased anxiousness which has her pacing around the house more. When she does this her son follows her around fussing loudly at her which causes her to be even more anxious.

## 2013-01-11 ENCOUNTER — Emergency Department (HOSPITAL_COMMUNITY): Payer: Medicare Other

## 2013-01-11 ENCOUNTER — Inpatient Hospital Stay (HOSPITAL_COMMUNITY)
Admission: EM | Admit: 2013-01-11 | Discharge: 2013-01-16 | DRG: 811 | Disposition: A | Discharge status: Home / Self Care | Payer: Medicare Other | Attending: Internal Medicine | Admitting: Internal Medicine

## 2013-01-11 ENCOUNTER — Encounter (HOSPITAL_COMMUNITY): Payer: Self-pay | Admitting: Emergency Medicine

## 2013-01-11 DIAGNOSIS — D649 Anemia, unspecified: Secondary | ICD-10-CM | POA: Diagnosis present

## 2013-01-11 DIAGNOSIS — Z9861 Coronary angioplasty status: Secondary | ICD-10-CM

## 2013-01-11 DIAGNOSIS — I10 Essential (primary) hypertension: Secondary | ICD-10-CM | POA: Diagnosis present

## 2013-01-11 DIAGNOSIS — R531 Weakness: Secondary | ICD-10-CM | POA: Diagnosis present

## 2013-01-11 DIAGNOSIS — K5730 Diverticulosis of large intestine without perforation or abscess without bleeding: Secondary | ICD-10-CM | POA: Diagnosis present

## 2013-01-11 DIAGNOSIS — E785 Hyperlipidemia, unspecified: Secondary | ICD-10-CM | POA: Diagnosis present

## 2013-01-11 DIAGNOSIS — K219 Gastro-esophageal reflux disease without esophagitis: Secondary | ICD-10-CM | POA: Diagnosis present

## 2013-01-11 DIAGNOSIS — F411 Generalized anxiety disorder: Secondary | ICD-10-CM | POA: Diagnosis present

## 2013-01-11 DIAGNOSIS — D500 Iron deficiency anemia secondary to blood loss (chronic): Principal | ICD-10-CM

## 2013-01-11 DIAGNOSIS — H9190 Unspecified hearing loss, unspecified ear: Secondary | ICD-10-CM | POA: Diagnosis present

## 2013-01-11 DIAGNOSIS — C44111 Basal cell carcinoma of skin of unspecified eyelid, including canthus: Secondary | ICD-10-CM

## 2013-01-11 DIAGNOSIS — M129 Arthropathy, unspecified: Secondary | ICD-10-CM | POA: Diagnosis present

## 2013-01-11 DIAGNOSIS — Z66 Do not resuscitate: Secondary | ICD-10-CM | POA: Diagnosis present

## 2013-01-11 DIAGNOSIS — I739 Peripheral vascular disease, unspecified: Secondary | ICD-10-CM | POA: Diagnosis present

## 2013-01-11 DIAGNOSIS — K922 Gastrointestinal hemorrhage, unspecified: Secondary | ICD-10-CM

## 2013-01-11 DIAGNOSIS — D638 Anemia in other chronic diseases classified elsewhere: Secondary | ICD-10-CM | POA: Diagnosis present

## 2013-01-11 DIAGNOSIS — Z515 Encounter for palliative care: Secondary | ICD-10-CM

## 2013-01-11 DIAGNOSIS — I2510 Atherosclerotic heart disease of native coronary artery without angina pectoris: Secondary | ICD-10-CM | POA: Diagnosis present

## 2013-01-11 DIAGNOSIS — Z85038 Personal history of other malignant neoplasm of large intestine: Secondary | ICD-10-CM

## 2013-01-11 DIAGNOSIS — J810 Acute pulmonary edema: Secondary | ICD-10-CM

## 2013-01-11 DIAGNOSIS — I5023 Acute on chronic systolic (congestive) heart failure: Secondary | ICD-10-CM | POA: Diagnosis not present

## 2013-01-11 DIAGNOSIS — R079 Chest pain, unspecified: Secondary | ICD-10-CM

## 2013-01-11 DIAGNOSIS — K5521 Angiodysplasia of colon with hemorrhage: Secondary | ICD-10-CM | POA: Diagnosis present

## 2013-01-11 DIAGNOSIS — I509 Heart failure, unspecified: Secondary | ICD-10-CM | POA: Diagnosis present

## 2013-01-11 DIAGNOSIS — K589 Irritable bowel syndrome without diarrhea: Secondary | ICD-10-CM | POA: Diagnosis present

## 2013-01-11 DIAGNOSIS — I252 Old myocardial infarction: Secondary | ICD-10-CM

## 2013-01-11 DIAGNOSIS — K5090 Crohn's disease, unspecified, without complications: Secondary | ICD-10-CM | POA: Diagnosis present

## 2013-01-11 LAB — COMPREHENSIVE METABOLIC PANEL
Alkaline Phosphatase: 60 U/L (ref 39–117)
BUN: 24 mg/dL — ABNORMAL HIGH (ref 6–23)
Calcium: 8.4 mg/dL (ref 8.4–10.5)
GFR calc Af Amer: 37 mL/min — ABNORMAL LOW (ref >90)
Glucose, Bld: 151 mg/dL — ABNORMAL HIGH (ref 70–99)
Total Protein: 6.1 g/dL (ref 6.0–8.3)

## 2013-01-11 LAB — MAGNESIUM: Magnesium: 1.7 mg/dL (ref 1.5–2.5)

## 2013-01-11 LAB — CBC
HCT: 17.3 % — ABNORMAL LOW (ref 36.0–46.0)
Hemoglobin: 5.3 g/dL — CL (ref 12.0–15.0)
MCH: 23.6 pg — ABNORMAL LOW (ref 26.0–34.0)
MCHC: 30.6 g/dL (ref 30.0–36.0)

## 2013-01-11 LAB — SAMPLE TO BLOOD BANK

## 2013-01-11 LAB — PREPARE RBC (CROSSMATCH)

## 2013-01-11 LAB — HEMOGLOBIN AND HEMATOCRIT, BLOOD: Hemoglobin: 6.7 g/dL — CL (ref 12.0–15.0)

## 2013-01-11 MED ORDER — METOPROLOL SUCCINATE ER 100 MG PO TB24
100.0000 mg | ORAL_TABLET | Freq: Every day | ORAL | Status: DC
  Filled 2013-01-11 (×3): qty 1

## 2013-01-11 MED ORDER — BIOTENE DRY MOUTH MT LIQD
15.0000 mL | Freq: Two times a day (BID) | OROMUCOSAL | Status: DC
  Administered 2013-01-11 – 2013-01-16 (×10): 15 mL via OROMUCOSAL

## 2013-01-11 MED ORDER — AMLODIPINE BESYLATE 10 MG PO TABS
10.0000 mg | ORAL_TABLET | Freq: Every day | ORAL | Status: DC
  Administered 2013-01-13 – 2013-01-16 (×4): 10 mg via ORAL
  Filled 2013-01-11 (×6): qty 1

## 2013-01-11 MED ORDER — LORATADINE 10 MG PO TABS
10.0000 mg | ORAL_TABLET | Freq: Every day | ORAL | Status: DC
  Administered 2013-01-11 – 2013-01-16 (×5): 10 mg via ORAL
  Filled 2013-01-11 (×6): qty 1

## 2013-01-11 MED ORDER — ATORVASTATIN CALCIUM 20 MG PO TABS
20.0000 mg | ORAL_TABLET | Freq: Every day | ORAL | Status: DC
  Administered 2013-01-11 – 2013-01-14 (×3): 20 mg via ORAL
  Filled 2013-01-11 (×7): qty 1

## 2013-01-11 MED ORDER — ASPIRIN 81 MG PO CHEW
81.0000 mg | CHEWABLE_TABLET | Freq: Every day | ORAL | Status: DC
  Administered 2013-01-11 – 2013-01-16 (×5): 81 mg via ORAL
  Filled 2013-01-11 (×6): qty 1

## 2013-01-11 MED ORDER — TERAZOSIN HCL 1 MG PO CAPS
1.0000 mg | ORAL_CAPSULE | Freq: Every day | ORAL | Status: DC
  Administered 2013-01-12 – 2013-01-15 (×4): 1 mg via ORAL
  Filled 2013-01-11 (×7): qty 1

## 2013-01-11 MED ORDER — SODIUM CHLORIDE 0.9 % IV SOLN
20.0000 mL | INTRAVENOUS | Status: DC
  Administered 2013-01-11: 20 mL via INTRAVENOUS

## 2013-01-11 MED ORDER — LORAZEPAM 0.5 MG PO TABS
0.5000 mg | ORAL_TABLET | Freq: Four times a day (QID) | ORAL | Status: DC | PRN
  Administered 2013-01-11 – 2013-01-12 (×3): 0.5 mg via ORAL
  Filled 2013-01-11 (×3): qty 1

## 2013-01-11 MED ORDER — NITROGLYCERIN 0.4 MG SL SUBL: 0.4000 mg | SUBLINGUAL_TABLET | SUBLINGUAL | Status: DC | PRN

## 2013-01-11 MED ORDER — RAMIPRIL 10 MG PO CAPS
10.0000 mg | ORAL_CAPSULE | Freq: Every day | ORAL | Status: DC
  Administered 2013-01-13 – 2013-01-16 (×4): 10 mg via ORAL
  Filled 2013-01-11 (×6): qty 1

## 2013-01-11 MED ORDER — FUROSEMIDE 40 MG PO TABS
40.0000 mg | ORAL_TABLET | Freq: Every day | ORAL | Status: DC
  Administered 2013-01-11 – 2013-01-16 (×6): 40 mg via ORAL
  Filled 2013-01-11 (×6): qty 1

## 2013-01-11 MED ORDER — FUROSEMIDE 10 MG/ML IJ SOLN
20.0000 mg | Freq: Once | INTRAMUSCULAR | Status: AC
  Administered 2013-01-11: 20 mg via INTRAVENOUS

## 2013-01-11 MED ORDER — SIMVASTATIN 40 MG PO TABS: 40.0000 mg | ORAL_TABLET | Freq: Every day | ORAL | Status: DC

## 2013-01-11 MED ORDER — SODIUM CHLORIDE 0.9 % IJ SOLN
3.0000 mL | Freq: Two times a day (BID) | INTRAMUSCULAR | Status: DC
  Administered 2013-01-11 – 2013-01-16 (×11): 3 mL via INTRAVENOUS

## 2013-01-11 MED ORDER — HYDROCODONE-ACETAMINOPHEN 5-325 MG PO TABS
1.0000 | ORAL_TABLET | Freq: Two times a day (BID) | ORAL | Status: DC | PRN
  Administered 2013-01-11: 1 via ORAL

## 2013-01-11 MED ORDER — LORAZEPAM 0.5 MG PO TABS
0.5000 mg | ORAL_TABLET | Freq: Two times a day (BID) | ORAL | Status: DC
  Administered 2013-01-11 – 2013-01-15 (×8): 0.5 mg via ORAL
  Filled 2013-01-11 (×9): qty 1

## 2013-01-11 MED ORDER — FUROSEMIDE 10 MG/ML IJ SOLN
20.0000 mg | Freq: Once | INTRAMUSCULAR | Status: AC
  Administered 2013-01-11: 17:00:00 20 mg via INTRAVENOUS
  Filled 2013-01-11: qty 2

## 2013-01-11 MED ORDER — HYDROCODONE-ACETAMINOPHEN 5-325 MG PO TABS
1.0000 | ORAL_TABLET | ORAL | Status: DC | PRN
  Filled 2013-01-11: qty 1

## 2013-01-11 MED ORDER — FUROSEMIDE 10 MG/ML IJ SOLN
INTRAMUSCULAR | Status: AC
  Filled 2013-01-11: qty 4

## 2013-01-11 MED ORDER — ROPINIROLE HCL 0.5 MG PO TABS
0.5000 mg | ORAL_TABLET | Freq: Every day | ORAL | Status: DC
  Administered 2013-01-11 – 2013-01-14 (×4): 0.5 mg via ORAL
  Filled 2013-01-11 (×8): qty 1

## 2013-01-11 MED ORDER — SERTRALINE HCL 50 MG PO TABS
50.0000 mg | ORAL_TABLET | Freq: Every day | ORAL | Status: DC
  Administered 2013-01-11 – 2013-01-16 (×5): 50 mg via ORAL
  Filled 2013-01-11 (×6): qty 1

## 2013-01-11 MED ORDER — SODIUM CHLORIDE 0.45 % IV BOLUS
250.0000 mL | Freq: Once | INTRAVENOUS | Status: AC
  Administered 2013-01-11: 11:00:00 250 mL via INTRAVENOUS

## 2013-01-11 MED ORDER — SENNOSIDES-DOCUSATE SODIUM 8.6-50 MG PO TABS
2.0000 | ORAL_TABLET | Freq: Every day | ORAL | Status: DC
  Administered 2013-01-11 – 2013-01-15 (×5): 2 via ORAL
  Filled 2013-01-11 (×7): qty 2

## 2013-01-11 MED ORDER — SODIUM CHLORIDE 0.9 % IV BOLUS (SEPSIS)
500.0000 mL | Freq: Once | INTRAVENOUS | Status: AC
  Administered 2013-01-11: 500 mL via INTRAVENOUS

## 2013-01-11 MED ORDER — PANTOPRAZOLE SODIUM 40 MG PO TBEC
80.0000 mg | DELAYED_RELEASE_TABLET | Freq: Every day | ORAL | Status: DC
  Administered 2013-01-11 – 2013-01-15 (×4): 80 mg via ORAL
  Filled 2013-01-11 (×5): qty 2

## 2013-01-11 MED ORDER — RAMIPRIL 10 MG PO TABS: 10.0000 mg | ORAL_TABLET | Freq: Every day | ORAL | Status: DC

## 2013-01-11 MED ORDER — EZETIMIBE 10 MG PO TABS
10.0000 mg | ORAL_TABLET | Freq: Every day | ORAL | Status: DC
  Administered 2013-01-13 – 2013-01-16 (×4): 10 mg via ORAL
  Filled 2013-01-11 (×6): qty 1

## 2013-01-11 NOTE — Progress Notes (Signed)
PM note - post transfusion Hgb 6.7 g  Plan 2 additional units PRBCs ordered  F/u H/H 0600 hrs.

## 2013-01-11 NOTE — ED Notes (Signed)
Per EMS- pt lives at home with son, Ann Maki and patient stated she had chest pain earlier Thursday, resolved. Returned last night around 8pm. Pt cannot describe pain other than it just hurts. Unable to obtain IV access

## 2013-01-11 NOTE — ED Provider Notes (Signed)
CSN: [id number]     Arrival date & time 01/11/13  0113 History   First MD Initiated Contact with Patient 01/11/13 0115     Chief Complaint  Patient presents with  . Chest Pain   (Consider location/radiation/quality/duration/timing/severity/associated sxs/prior Treatment) HPI 79 year old female presents to emergency room via EMS with complaint of chest pain.  Per EMS, she reported chest pain.  Thursday morning, and then just prior to arrival.  Patient is very hard of hearing.  She is a difficult historian.  She reports she had chest pain.  This morning upon awakening.  She is not oriented to today's date.  She thinks it is Monday, and reports she had chest pain on Thursday.  She does report that she had chest pain, just prior to EMS arrival, approximately an hour.  She denies any associated symptoms with chest pain.  Chest pain was substernal.  Patient reports "I had another heart attack" she denies any nausea and vomiting.  She denies any abdominal pain.  EMS reports patient was having a bowel movement upon their arrival.  He reports seeing some blood in the toilet.  Patient has history of IBS, lower GI bleed. Past Medical History  Diagnosis Date  . UTI (urinary tract infection)   . IBS (irritable bowel syndrome)   . Abdominal pain, left lower quadrant   . CAD (coronary artery disease)   . Pneumonia   . HTN (hypertension)   . Hyperlipidemia   . Diverticulosis of colon   . History of colon cancer   . Allergic rhinitis   . Angina   . Cancer   . Arthritis   . Shortness of breath   . Anemia   . Blood transfusion   . Anxiety   . CHF (congestive heart failure)   . GERD (gastroesophageal reflux disease)   . Peripheral vascular disease    Past Surgical History  Procedure Laterality Date  . Hemicolectomy  1991    Right for mgt of colon cancer.  . Angioplasty  1989    Stent x 2  . Appendectomy    . Bilateral salpingoophorectomy    . Breast surgery      benign  . Orif hip fracture   12/2009    Dr Charlann Boxer  . Esophagogastroduodenoscopy  08/09/2011    Procedure: ESOPHAGOGASTRODUODENOSCOPY (EGD);  Surgeon: Hart Carwin, MD;  Location: [facility] ENDOSCOPY;  Service: Endoscopy;  Laterality: N/A;  . Colonoscopy  10/24/2011    Procedure: COLONOSCOPY;  Surgeon: Beverley Fiedler, MD;  Location: [facility] ENDOSCOPY;  Service: Gastroenterology;  Laterality: N/A;  . Colon surgery     Family History  Problem Relation Age of Onset  . Colon cancer Neg Hx   . Breast cancer Neg Hx    History  Substance Use Topics  . Smoking status: Never Smoker   . Smokeless tobacco: Not on file  . Alcohol Use: No   OB History   Grav Para Term Preterm Abortions TAB SAB Ect Mult Living                 Review of Systems  Unable to perform ROS: Other   hard of hearing, cannot answer review of system questions  Allergies  Antihistamines, chlorpheniramine-type and Chocolate  Home Medications   Current Outpatient Rx  Name  Route  Sig  Dispense  Refill  . amLODipine (NORVASC) 10 MG tablet   Oral   Take 1 tablet (10 mg total) by mouth daily.   30 tablet  5   . aspirin 81 MG chewable tablet   Oral   Chew 1 tablet (81 mg total) by mouth daily.   30 tablet   5   . esomeprazole (NEXIUM) 40 MG capsule   Oral   Take 1 capsule (40 mg total) by mouth daily.   30 capsule   5   . ezetimibe (ZETIA) 10 MG tablet   Oral   Take 1 tablet (10 mg total) by mouth daily.   30 tablet   2   . furosemide (LASIX) 40 MG tablet   Oral   Take 1 tablet (40 mg total) by mouth daily.   30 tablet   5   . HYDROcodone-acetaminophen (NORCO/VICODIN) 5-325 MG per tablet      TAKE 1 TABLET BY MOUTH TWICE DAILY AS NEEDED FOR PAIN   60 tablet   0   . loratadine (CLARITIN) 10 MG tablet   Oral   Take 1 tablet (10 mg total) by mouth daily.   30 tablet   5   . LORazepam (ATIVAN) 0.5 MG tablet      TAKE 1 TABLET BY MOUTH TWICE DAILY   60 tablet   5   . metoprolol succinate (TOPROL-XL) 100 MG 24 hr tablet   Oral    Take 1 tablet (100 mg total) by mouth daily.   30 tablet   5   . Multiple Vitamins-Minerals (CENTRUM SILVER) tablet   Oral   Take 1 tablet by mouth daily.   30 tablet   5   . nitroGLYCERIN (NITROSTAT) 0.4 MG SL tablet   Sublingual   Place 1 tablet (0.4 mg total) under the tongue every 5 (five) minutes as needed. For chest pain   30 tablet   1   . pravastatin (PRAVACHOL) 80 MG tablet   Oral   Take 1 tablet (80 mg total) by mouth daily.   30 tablet   5   . ramipril (ALTACE) 10 MG tablet   Oral   Take 1 tablet (10 mg total) by mouth daily.   30 tablet   5   . senna-docusate (SENOKOT-S) 8.6-50 MG per tablet   Oral   Take 2 tablets by mouth at bedtime.   60 tablet   11   . terazosin (HYTRIN) 1 MG capsule   Oral   Take 1 capsule (1 mg total) by mouth at bedtime.   30 capsule   5   . triamcinolone cream (KENALOG) 0.1 %   Topical   Apply 1 application topically 2 (two) times daily as needed (itching).   30 g   5    BP 98/56  Pulse 80  SpO2 80% Physical Exam  Nursing note and vitals reviewed. Constitutional:  Frail, elderly, chronically ill-appearing  HENT:  Head: Normocephalic and atraumatic.  Right Ear: External ear normal.  Left Ear: External ear normal.  Nose: Nose normal.  Mouth/Throat: Oropharynx is clear and moist.  Eyes: Conjunctivae and EOM are normal. Pupils are equal, round, and reactive to light.  Neck: Normal range of motion. Neck supple. No JVD present. No tracheal deviation present. No thyromegaly present.  Cardiovascular: Normal rate, regular rhythm and intact distal pulses.  Exam reveals no gallop and no friction rub.   Murmur heard. Pulmonary/Chest: Effort normal and breath sounds normal. No stridor. No respiratory distress. She has no wheezes. She has no rales. She exhibits no tenderness.  Abdominal: Soft. Bowel sounds are normal. She exhibits no distension and no mass. There  is no tenderness. There is no rebound and no guarding.   Musculoskeletal: Normal range of motion. She exhibits no edema and no tenderness.  Lymphadenopathy:    She has no cervical adenopathy.  Neurological: She is alert. She exhibits normal muscle tone. Coordination normal.  Skin: Skin is warm and dry. No rash noted. No erythema. There is pallor.  Psychiatric: She has a normal mood and affect. Her behavior is normal. Judgment and thought content normal.    ED Course  Procedures (including critical care time) Labs Review Labs Reviewed  PRO B NATRIURETIC PEPTIDE - Abnormal; Notable for the following:    Pro B Natriuretic peptide (BNP) 5785.0 (*)    All other components within normal limits  CBC - Abnormal; Notable for the following:    RBC 2.25 (*)    Hemoglobin 5.3 (*)    HCT 17.3 (*)    MCV 76.9 (*)    MCH 23.6 (*)    Platelets 136 (*)    All other components within normal limits  COMPREHENSIVE METABOLIC PANEL - Abnormal; Notable for the following:    Sodium 131 (*)    Glucose, Bld 151 (*)    BUN 24 (*)    Creatinine, Ser 1.40 (*)    Albumin 3.3 (*)    GFR calc non Af Amer 32 (*)    GFR calc Af Amer 37 (*)    All other components within normal limits  MAGNESIUM  TROPONIN I  SAMPLE TO BLOOD BANK  TYPE AND SCREEN  PREPARE RBC (CROSSMATCH)   Imaging Review Dg Chest 2 View  01/11/2013   CLINICAL DATA:  Chest pain  EXAM: CHEST  2 VIEW  COMPARISON:  10/11/2012  FINDINGS: There is hyperinflation of the lungs compatible with COPD. Mild cardiomegaly and vascular congestion. Bibasilar atelectasis. No effusions. No acute bony abnormality. Slight interstitial prominence. Cannot exclude interstitial edema.  IMPRESSION: COPD.  Question mild interstitial edema. Bibasilar atelectasis.   Electronically Signed   By: Charlett Nose M.D.   On: 01/11/2013 02:36    Date: 01/11/2013  Rate: 65  Rhythm: normal sinus rhythm  QRS Axis: normal  Intervals: normal  ST/T Wave abnormalities: ST depressions inferiorly and ST depressions laterally   Conduction Disutrbances:nonspecific intraventricular conduction delay  Narrative Interpretation: inferior st depressions new  Old EKG Reviewed: changes noted   MDM   1. Chest pain   2. Blood loss anemia    79 year old female with chest pain.  Patient appears very pale, concern for anemia.  Will get chest x-ray, lab work, EKG.    Olivia Mackie, MD 01/11/13 [phone]

## 2013-01-11 NOTE — Progress Notes (Signed)
Utilization Review Completed.   Kaho Selle, RN, BSN Nurse Case Manager  [phone]  

## 2013-01-11 NOTE — Care Management Note (Signed)
    Page 1 of 1   01/11/2013     11:28:54 AM   CARE MANAGEMENT NOTE 01/11/2013  Patient:  SHAKEIRA, RHEE   Account Number:  [id number]  Date Initiated:  01/11/2013  Documentation initiated by:  Tera Mater  Subjective/Objective Assessment:   79yo female admitted with Anemia.  Pt. lives at home with children.  Pt. is active with [facility].     Action/Plan:   discharge planning   Anticipated DC Date:  01/12/2013   Anticipated DC Plan:  HOME W [facility] CARE         Choice offered to / List presented to:             Status of service:  In process, will continue to follow Medicare Important Message given?   (If response is "NO", the following Medicare IM given date fields will be blank) Date Medicare IM given:   Date Additional Medicare IM given:    Discharge Disposition:    Per UR Regulation:  Reviewed for med. necessity/level of care/duration of stay  If discussed at Long Length of Stay Meetings, dates discussed:    Comments:  01/11/13 1127 Noted pt. is active with [facility].  TC to Rose, representative from [facility], to make aware of admission. Tera Mater, RN, BSN NCM [phone]

## 2013-01-11 NOTE — Progress Notes (Signed)
1520 Placed  A call  To Dr Debby Bud  Re . Pt's attempting  To climb out of bed , regardless  Repeated verbal commands , very hard of hearing , poor vision ,  Unable to read written clues . Bedside sitter  Ordered  Made Dr. Eather Colas of pinkish discharges when urinating  No clots no pain.

## 2013-01-11 NOTE — ED Notes (Signed)
Floor called at this time stating patient stating "I put my pink purse in the drawer downstairs." RN was present upon arrival with EMS, patient had NO pink purse or any purse with her at that time. Pt assisted to stretcher from bed with only self, clothes, and a cane. Label was placed on cane immediately. No purse was present. Floor nurse informed we do not have patient belonging drawers in rooms and that patient did not arrive with purse.

## 2013-01-11 NOTE — Progress Notes (Signed)
1810   Orders s from Dr. Debby Bud acknowledged . Blood transfusion starterd as ordered

## 2013-01-11 NOTE — H&P (Signed)
[facility] History and Physical  [name] ZOX:[id number] DOB: 1920-09-24    PCP:   [name], MD   Chief Complaint: weakness and chest tightness.  HPI: Darlene [name] is an 79 y.o. female with hx of lower GI bleed, hx of crohn's and colon ca, CAD with recent NSTEMI, hospice care patient with DNR code status, with goal of care outlined by Dr Debby Bud including no invasive intervention, transfusion as necessary, and continue with end of life care, presents to the ER with chest tightness and weakness.  She is otherwise doing quite well.   Evalation in the ER included an EKG which showed ischemic changes, a Hb of 5.3 grams per DL, and a Cr of 1.4. Her CXR showed some interstitial edema.  Hospitalist was asked to admit her for transfusion.  Rewiew of Systems:  Constitutional: Negative for malaise, fever and chills. No significant weight loss or weight gain. Hard of hearing. Eyes: Negative for eye pain, redness and discharge, diplopia, visual changes, or flashes of light. ENMT: Negative for ear pain, hoarseness, nasal congestion, sinus pressure and sore throat. No headaches; tinnitus, drooling, or problem swallowing. Cardiovascular: Negative for palpitations, diaphoresis, dyspnea and peripheral edema. ; No orthopnea, PND Respiratory: Negative for cough, hemoptysis, wheezing and stridor. No pleuritic chestpain. Gastrointestinal: Negative for nausea, vomiting, diarrhea, constipation, abdominal pain, melena, blood in stool, hematemesis, jaundice and rectal bleeding.    Genitourinary: Negative for frequency, dysuria, incontinence,flank pain and hematuria; Musculoskeletal: Negative for back pain and neck pain. Negative for swelling and trauma.;  Skin: . Negative for pruritus, rash, abrasions, bruising and skin lesion.; ulcerations Neuro: Negative for headache, lightheadedness and neck stiffness. Negative for altered level of consciousness , altered mental status,  burning feet,  involuntary movement, seizure and syncope.  Psych: negative for depression, insomnia, tearfulness, panic attacks, hallucinations, paranoia, suicidal or homicidal ideation    Past Medical History  Diagnosis Date  . UTI (urinary tract infection)   . IBS (irritable bowel syndrome)   . Abdominal pain, left lower quadrant   . CAD (coronary artery disease)   . Pneumonia   . HTN (hypertension)   . Hyperlipidemia   . Diverticulosis of colon   . History of colon cancer   . Allergic rhinitis   . Angina   . Cancer   . Arthritis   . Shortness of breath   . Anemia   . Blood transfusion   . Anxiety   . CHF (congestive heart failure)   . GERD (gastroesophageal reflux disease)   . Peripheral vascular disease     Past Surgical History  Procedure Laterality Date  . Hemicolectomy  1991    Right for mgt of colon cancer.  . Angioplasty  1989    Stent x 2  . Appendectomy    . Bilateral salpingoophorectomy    . Breast surgery      benign  . Orif hip fracture  12/2009    Dr Charlann Boxer  . Esophagogastroduodenoscopy  08/09/2011    Procedure: ESOPHAGOGASTRODUODENOSCOPY (EGD);  Surgeon: Hart Carwin, MD;  Location: [facility] ENDOSCOPY;  Service: Endoscopy;  Laterality: N/A;  . Colonoscopy  10/24/2011    Procedure: COLONOSCOPY;  Surgeon: Beverley Fiedler, MD;  Location: [facility] ENDOSCOPY;  Service: Gastroenterology;  Laterality: N/A;  . Colon surgery      Medications:  HOME MEDS: Prior to Admission medications   Medication Sig Start Date End Date Taking? Authorizing Provider  rOPINIRole (REQUIP) 0.5 MG tablet Take 0.5 mg by mouth at  bedtime.   Yes Historical Provider, MD  sertraline (ZOLOFT) 50 MG tablet Take 50 mg by mouth daily.   Yes Historical Provider, MD  amLODipine (NORVASC) 10 MG tablet Take 1 tablet (10 mg total) by mouth daily. 07/19/12   Jacques Navy, MD  aspirin 81 MG chewable tablet Chew 1 tablet (81 mg total) by mouth daily. 07/19/12   Jacques Navy, MD  esomeprazole (NEXIUM) 40 MG capsule Take  1 capsule (40 mg total) by mouth daily. 07/19/12   Jacques Navy, MD  ezetimibe (ZETIA) 10 MG tablet Take 1 tablet (10 mg total) by mouth daily. 04/30/12   Jacques Navy, MD  furosemide (LASIX) 40 MG tablet Take 1 tablet (40 mg total) by mouth daily. 07/19/12   Jacques Navy, MD  HYDROcodone-acetaminophen (NORCO/VICODIN) 5-325 MG per tablet Take 1 tablet by mouth 2 (two) times daily as needed. 01/04/13   Jacques Navy, MD  loratadine (CLARITIN) 10 MG tablet Take 1 tablet (10 mg total) by mouth daily. 01/04/13   Jacques Navy, MD  LORazepam (ATIVAN) 0.5 MG tablet Take 0.5 mg by mouth 2 (two) times daily. 01/10/13   Jacques Navy, MD  metoprolol succinate (TOPROL-XL) 100 MG 24 hr tablet Take 1 tablet (100 mg total) by mouth daily. 07/19/12   Jacques Navy, MD  Multiple Vitamins-Minerals (CENTRUM SILVER) tablet Take 1 tablet by mouth daily. 07/19/12   Jacques Navy, MD  nitroGLYCERIN (NITROSTAT) 0.4 MG SL tablet Place 1 tablet (0.4 mg total) under the tongue every 5 (five) minutes as needed. For chest pain 01/10/13   Jacques Navy, MD  pravastatin (PRAVACHOL) 80 MG tablet Take 1 tablet (80 mg total) by mouth daily. 07/19/12   Jacques Navy, MD  ramipril (ALTACE) 10 MG tablet Take 1 tablet (10 mg total) by mouth daily. 07/19/12   Jacques Navy, MD  senna-docusate (SENOKOT-S) 8.6-50 MG per tablet Take 2 tablets by mouth at bedtime. 10/13/12   Jacques Navy, MD  terazosin (HYTRIN) 1 MG capsule Take 1 capsule (1 mg total) by mouth at bedtime. 07/19/12   Jacques Navy, MD  triamcinolone cream (KENALOG) 0.1 % Apply 1 application topically 2 (two) times daily as needed (itching). 07/19/12   Jacques Navy, MD     Allergies:  Allergies  Allergen Reactions  . Antihistamines, Chlorpheniramine-Type Other (See Comments)    unknown  . Chocolate Other (See Comments)    Pt states "makes her sick" and she starts coughing    Social History:   reports that she has never smoked. She  does not have any smokeless tobacco history on file. She reports that she does not drink alcohol or use illicit drugs.  Family History: Family History  Problem Relation Age of Onset  . Colon cancer Neg Hx   . Breast cancer Neg Hx      Physical Exam: Filed Vitals:   01/11/13 0127 01/11/13 0133  BP: 98/56 102/48  Pulse: 80 69  Resp:  17  SpO2: 80% 93%   Blood pressure 102/48, pulse 69, resp. rate 17, SpO2 93.00%.  GEN:  Pleasant patient lying in the stretcher in no acute distress; cooperative with exam. Hard of hearing. PSYCH:  alert and oriented x4; does not appear anxious or depressed; affect is appropriate. HEENT: Mucous membranes pink and anicteric; PERRLA; EOM intact; no cervical lymphadenopathy nor thyromegaly or carotid bruit; no JVD; There were no stridor. Neck is very supple. Breasts:: Not examined CHEST  WALL: No tenderness CHEST: Normal respiration, clear to auscultation bilaterally.  HEART: Regular rate and rhythm.  There is a SEM murmur, rub, or gallops.   BACK: No kyphosis or scoliosis; no CVA tenderness ABDOMEN: soft and non-tender; no masses, no organomegaly, normal abdominal bowel sounds; no pannus; no intertriginous candida. There is no rebound and no distention. Rectal Exam: Not done EXTREMITIES: No bone or joint deformity; age-appropriate arthropathy of the hands and knees; no edema; no ulcerations.  There is no calf tenderness. Genitalia: not examined PULSES: 2+ and symmetric SKIN: Normal hydration no rash or ulceration CNS: Cranial nerves 2-12 grossly intact no focal lateralizing neurologic deficit.  Speech is fluent; uvula elevated with phonation, facial symmetry and tongue midline. DTR are normal bilaterally, cerebella exam is intact, barbinski is negative and strengths are equaled bilaterally.  No sensory loss.   Labs on Admission:  Basic Metabolic Panel:  Recent Labs Lab 01/11/13 0148  NA 131*  K 3.6  CL 96  CO2 23  GLUCOSE 151*  BUN 24*   CREATININE 1.40*  CALCIUM 8.4  MG 1.7   Liver Function Tests:  Recent Labs Lab 01/11/13 0148  AST 15  ALT 6  ALKPHOS 60  BILITOT 0.3  PROT 6.1  ALBUMIN 3.3*   No results found for this basename: LIPASE, AMYLASE,  in the last 168 hours No results found for this basename: AMMONIA,  in the last 168 hours CBC:  Recent Labs Lab 01/11/13 0148  WBC 6.7  HGB 5.3*  HCT 17.3*  MCV 76.9*  PLT 136*   Cardiac Enzymes:  Recent Labs Lab 01/11/13 0149  TROPONINI <0.30    CBG: No results found for this basename: GLUCAP,  in the last 168 hours   Radiological Exams on Admission: Dg Chest 2 View  01/11/2013   CLINICAL DATA:  Chest pain  EXAM: CHEST  2 VIEW  COMPARISON:  10/11/2012  FINDINGS: There is hyperinflation of the lungs compatible with COPD. Mild cardiomegaly and vascular congestion. Bibasilar atelectasis. No effusions. No acute bony abnormality. Slight interstitial prominence. Cannot exclude interstitial edema.  IMPRESSION: COPD.  Question mild interstitial edema. Bibasilar atelectasis.   Electronically Signed   By: Charlett Nose M.D.   On: 01/11/2013 02:36    EKG: Independently reviewed. SR with ischemic changes in the inferolateral leads.   Assessment/Plan Present on Admission:  . Weakness generalized . Anemia . DIVERTICULOSIS, COLON . COLON CANCER, HX OF . Crohn disease . HYPERTENSION . DNR (do not resuscitate)  PLAN:  This patient will be admitted for transfusion (symptomatic anemia), and continue her palliative care status with DNR code status.  She is not a candidate for any interventional treatment as suggested by her cardiology and her PCP.  Will give her 2 units of PRBC.  Please be careful not to put her in CHF.  She is a DNR and will be admitted to Dr Izora Ribas service as per prior arrangement.  Thank you for allowing me to participate in the care of your nice patient.  Other plans as per orders.  Code Status: DNR/Hospice Care.   Darlene Siren, MD. [facility] Pager [phone] 7pm to 7am.  01/11/2013, 3:47 AM

## 2013-01-11 NOTE — Progress Notes (Signed)
Pt lying in bed awake.  She is hard of hearing.  Per Pt she feels maybe a little better.  No family present.    Reviewed chart.  Pt admitted for Anemia/Recurrent GI bleed.   Pt is currently under hospice services for CHF.  This is not a hospice related admission.  [facility] label, med list, and transfer summary placed on shadow chart. Please notify [facility] of any Pt movements at [phone].  Elijah Birk RN, [facility] RN, [facility] [phone]

## 2013-01-11 NOTE — ED Notes (Signed)
Pt brought by EMS. Hard of hearing. Pt states Thursday morning she had chest pain, her hospice nurse came to see her and she felt better. Then she said at night it began again. Pt states it just hurts in her chest 7/10. Denies N/V.

## 2013-01-11 NOTE — ED Notes (Signed)
Pt taken to radiology

## 2013-01-11 NOTE — Progress Notes (Signed)
Subjective: Darlene [name] has been admitted for recurrent GI bleed - known AVMs- and associated stress related ischemic changes on EKG with negative Troponin. At this time she denies having any chest pain or SOB  Objective: Lab:  Recent Labs  01/11/13 0148  WBC 6.7  HGB 5.3*  HCT 17.3*  MCV 76.9*  PLT 136*    Recent Labs  01/11/13 0148  NA 131*  K 3.6  CL 96  GLUCOSE 151*  BUN 24*  CREATININE 1.40*  CALCIUM 8.4  MG 1.7    Imaging: 12/11/12 CXR: IMPRESSION: IMPRESSION:  COPD.  Question mild interstitial edema. Bibasilar atelectasis.  COPD.  Question mild interstitial edema. Bibasilar atelectasis.  Scheduled Meds: . amLODipine  10 mg Oral Daily  . antiseptic oral rinse  15 mL Mouth Rinse BID  . aspirin  81 mg Oral Daily  . atorvastatin  20 mg Oral q1800  . ezetimibe  10 mg Oral Daily  . furosemide  40 mg Oral Daily  . loratadine  10 mg Oral Daily  . LORazepam  0.5 mg Oral BID  . metoprolol succinate  100 mg Oral Daily  . pantoprazole  80 mg Oral Q1200  . ramipril  10 mg Oral Daily  . rOPINIRole  0.5 mg Oral QHS  . senna-docusate  2 tablet Oral QHS  . sertraline  50 mg Oral Daily  . sodium chloride  3 mL Intravenous Q12H  . terazosin  1 mg Oral QHS   Continuous Infusions:  PRN Meds:.HYDROcodone-acetaminophen, HYDROcodone-acetaminophen, LORazepam, nitroGLYCERIN   Physical Exam: Filed Vitals:   01/11/13 0623  BP: 97/65  Pulse: 76  Temp: 97.9 F (36.6 C)  Resp: 22   Gen'l- Elderly white woman in no distress, awake and alert HEENT- normal Cor- 2+ radial, RRR Pulm - no increase WOB, no rales, no wheezes Neuor - alert and oriented.      Assessment/Plan: 1. Anemaia- blood loss from known AVM. No further diagnostic eval needed. Plan Transfuse 2 u PRBCs  Follow up H/H - goal is Hgb > 8  2. Cardiac - known CAD and recurrent angina stress related to anemia. Dr. Jacinto Halim in tha past has opined that she would not benefit from further diagnostic eval and  she is not a candidate for intervention. Currently hypotensive due to volume depletion. PLan IVF bolus 250 cc  Transfuse  Care re: fluid overload.     [name] [facility] (o) [phone]; (c) [phone] Call-grp - [facility]  Tele: [phone]  01/11/2013, 9:46 AM

## 2013-01-11 NOTE — ED Notes (Signed)
Critical Hgb called from [place] from lab. Hgb 5.3 MD notified.

## 2013-01-11 NOTE — Progress Notes (Signed)
1755 placed a  Call to Dr. Debby Bud re : tachypnea  26-28 BPM  , fine crackles at the bases O2 sat 88-90 on 5 l/m [place] .Denied difficulty of breathing

## 2013-01-12 DIAGNOSIS — Z66 Do not resuscitate: Secondary | ICD-10-CM

## 2013-01-12 DIAGNOSIS — D649 Anemia, unspecified: Secondary | ICD-10-CM

## 2013-01-12 DIAGNOSIS — I509 Heart failure, unspecified: Secondary | ICD-10-CM

## 2013-01-12 LAB — HEMOGLOBIN AND HEMATOCRIT, BLOOD
HCT: 24.1 % — ABNORMAL LOW (ref 36.0–46.0)
HCT: 26.1 % — ABNORMAL LOW (ref 36.0–46.0)
HCT: 27.3 % — ABNORMAL LOW (ref 36.0–46.0)
Hemoglobin: 8.4 g/dL — ABNORMAL LOW (ref 12.0–15.0)
Hemoglobin: 8.8 g/dL — ABNORMAL LOW (ref 12.0–15.0)

## 2013-01-12 MED ORDER — LORAZEPAM 2 MG/ML IJ SOLN
0.5000 mg | Freq: Four times a day (QID) | INTRAMUSCULAR | Status: DC | PRN
  Administered 2013-01-12: 0.5 mg via INTRAVENOUS
  Filled 2013-01-12: qty 1

## 2013-01-12 MED ORDER — FUROSEMIDE 10 MG/ML IJ SOLN
80.0000 mg | Freq: Three times a day (TID) | INTRAMUSCULAR | Status: AC
  Administered 2013-01-12 – 2013-01-13 (×3): 80 mg via INTRAVENOUS
  Filled 2013-01-12 (×3): qty 8

## 2013-01-12 MED ORDER — LORAZEPAM 2 MG/ML IJ SOLN
0.5000 mg | INTRAMUSCULAR | Status: DC | PRN
  Administered 2013-01-12 – 2013-01-15 (×5): 0.5 mg via INTRAVENOUS
  Filled 2013-01-12 (×6): qty 1

## 2013-01-12 NOTE — Progress Notes (Signed)
When the patient's O2 sats rose to 100% on the nonrebreather mask, the RN and the charge RN tried to wean the patient to 6L N/C.  The patient's O2 sats fell to the upper 60s.  She was immediately placed back on the nonrebreather mask and her O2 sats rose to the mid-80s.  While she did rise to the low 90s at times, she stayed mostly in the mid to upper 80s on the nonrebreather mask.  She also had very little urine output (75 mL) from the IV Lasix given earlier and still had wheezing/rhonchi.  A bladder scan was performed and showed less than 34 mL in her bladder.  Darlene [name] was notified and she consulted rapid response.  New orders were given to transfer the patient to a stepdown unit where she could be placed on a Bi-pap.  Orders were also given not to transfuse the other two units of blood due to the patient's condition.

## 2013-01-12 NOTE — Care Management Utilization Note (Signed)
UR Completed.  

## 2013-01-12 NOTE — Progress Notes (Signed)
Subjective: Events noted- flash pulmonary edema seems the working diagnosis.   She is awake, finds BiPAP uncomfortable but is otherwise in no distress. She denies chest pain  Objective: Lab:  Recent Labs  01/11/13 0148 01/11/13 1652 01/11/13 2331 01/12/13 0441  WBC 6.7  --   --   --   HGB 5.3* 6.7* 8.4* 7.8*  HCT 17.3* 20.8* 26.1* 24.1*  MCV 76.9*  --   --   --   PLT 136*  --   --   --     Recent Labs  01/11/13 0148  NA 131*  K 3.6  CL 96  GLUCOSE 151*  BUN 24*  CREATININE 1.40*  CALCIUM 8.4  MG 1.7    Imaging: CXR 0200 FINDINGS:  There is hyperinflation of the lungs compatible with COPD. Mild  cardiomegaly and vascular congestion. Bibasilar atelectasis. No  effusions. No acute bony abnormality. Slight interstitial  prominence. Cannot exclude interstitial edema.  IMPRESSION:  COPD.  Question mild interstitial edema. Bibasilar atelectasis.  Scheduled Meds: . amLODipine  10 mg Oral Daily  . antiseptic oral rinse  15 mL Mouth Rinse BID  . aspirin  81 mg Oral Daily  . atorvastatin  20 mg Oral q1800  . ezetimibe  10 mg Oral Daily  . furosemide  40 mg Oral Daily  . loratadine  10 mg Oral Daily  . LORazepam  0.5 mg Oral BID  . metoprolol succinate  100 mg Oral Daily  . pantoprazole  80 mg Oral Q1200  . ramipril  10 mg Oral Daily  . rOPINIRole  0.5 mg Oral QHS  . senna-docusate  2 tablet Oral QHS  . sertraline  50 mg Oral Daily  . sodium chloride  3 mL Intravenous Q12H  . terazosin  1 mg Oral QHS   Continuous Infusions:  PRN Meds:.HYDROcodone-acetaminophen, HYDROcodone-acetaminophen, LORazepam, nitroGLYCERIN   Physical Exam: Filed Vitals:   01/12/13 0830  BP: 127/54  Pulse: 67  Temp: 100.5 F (38.1 C)  Resp: 19    Intake/Output Summary (Last 24 hours) at 01/12/13 1115 Last data filed at 01/12/13 0800  Gross per 24 hour  Intake 795.92 ml  Output    652 ml  Net 143.92 ml   Gen'l Elderly white woman who is anxious. HEENT- face mask in  place Cor - 2+ radial, RRR Pulm - increased WOB, diffuse rales, prolonged expiratory phase Neuro - awake and alert, a little anxious and confused.      Assessment/Plan: 1. Anemia - Hgb at 1652hrs 10/3 was still low (thought to be after 2 units) and 2 additional units were ordered but not given due to acute chf. Next Hgb 2331 hrs 8.4, 0441 hr 7.8. She has not chestpain  Plan No further blood at this time  Continue to monitor H/H  2. Cardiac - flash pulmonary edema after 2 units of blood. Still on BiPAP. BP stable  Plan   IV lasix 80 mg q 8 x 3 doses  Foley cath - needed to get accurate I/Os   [name] [place] [facility] (o) [phone]; (c) [phone] Call-grp - Patsi Sears IM  Tele: [phone]  01/12/2013, 11:07 AM

## 2013-01-12 NOTE — Progress Notes (Signed)
Pt continuously pulling at BIPAP mask throughout the day.  Staff present at bedside to divert activity. Pt trialed off of BIPAP x3 throughout shift without success.  Pt's oxygen saturations decreased to the mis 70's after several minutes on NRB with noted increase in WOB.  Pt reports feeling of "choking" when she is not on BIPAP.  Returned to BIPAP @ 90% FiO2.  With improvement in sats to 84-97%.  Pt visibly agitated despite reorientation to situation.  Ativan IV given with minimal effects.  Distraction, therapeutic touch, decreased lighting and stimulation provided.  Son in to visit earlier today.  Will continue to provide physical and emotional support.  Ceana Fiala, [facility]

## 2013-01-12 NOTE — Progress Notes (Addendum)
At 2045, The patient was wheezing and using her accessory muscles to breathe.  The patient stated that she was breathing "fine", but her O2 saturation level was in the lower 70s.  She was placed on a nonrebreather mask and rapid response came to assess the patient.  The patient's O2 sats recovered into the lower 90s on the nonrebreather mask.  The patient's second blood transfusion was almost complete at this time.  The NP Elray Mcgregor was notified of the patient's situation and orders were given to give 20 mg of IV Lasix at the completion of the blood transfusion.  The RN carried out the orders.

## 2013-01-12 NOTE — Progress Notes (Signed)
The patient was transferred to stepdown and the patient's son was notified.

## 2013-01-13 ENCOUNTER — Inpatient Hospital Stay (HOSPITAL_COMMUNITY): Payer: Medicare Other

## 2013-01-13 LAB — BASIC METABOLIC PANEL
CO2: 28 mEq/L (ref 19–32)
Calcium: 8.9 mg/dL (ref 8.4–10.5)
Creatinine, Ser: 1.27 mg/dL — ABNORMAL HIGH (ref 0.50–1.10)
GFR calc non Af Amer: 36 mL/min — ABNORMAL LOW (ref >90)
Sodium: 137 mEq/L (ref 135–145)

## 2013-01-13 LAB — HEMOGLOBIN AND HEMATOCRIT, BLOOD
HCT: 23.8 % — ABNORMAL LOW (ref 36.0–46.0)
Hemoglobin: 8.1 g/dL — ABNORMAL LOW (ref 12.0–15.0)

## 2013-01-13 MED ORDER — METOPROLOL TARTRATE 50 MG PO TABS
50.0000 mg | ORAL_TABLET | Freq: Two times a day (BID) | ORAL | Status: DC
  Administered 2013-01-13 – 2013-01-16 (×7): 50 mg via ORAL
  Filled 2013-01-13 (×9): qty 1

## 2013-01-13 NOTE — Progress Notes (Signed)
Subjective: Trial off BiPAP noted. At this time she is not working to breath on BiPAP. She is thirsty    Objective: Lab:  Recent Labs  01/11/13 0148  01/12/13 0441 01/12/13 1925 01/13/13 0800  WBC 6.7  --   --   --   --   HGB 5.3*  < > 7.8* 8.8* 8.1*  HCT 17.3*  < > 24.1* 27.3* 25.3*  MCV 76.9*  --   --   --   --   PLT 136*  --   --   --   --   < > = values in this interval not displayed.  Recent Labs  01/11/13 0148 01/13/13 0625  NA 131* 137  K 3.6 3.0*  CL 96 97  GLUCOSE 151* 104*  BUN 24* 32*  CREATININE 1.40* 1.27*  CALCIUM 8.4 8.9  MG 1.7  --     Imaging:  Scheduled Meds: . amLODipine  10 mg Oral Daily  . antiseptic oral rinse  15 mL Mouth Rinse BID  . aspirin  81 mg Oral Daily  . atorvastatin  20 mg Oral q1800  . ezetimibe  10 mg Oral Daily  . furosemide  40 mg Oral Daily  . loratadine  10 mg Oral Daily  . LORazepam  0.5 mg Oral BID  . metoprolol succinate  100 mg Oral Daily  . pantoprazole  80 mg Oral Q1200  . ramipril  10 mg Oral Daily  . rOPINIRole  0.5 mg Oral QHS  . senna-docusate  2 tablet Oral QHS  . sertraline  50 mg Oral Daily  . sodium chloride  3 mL Intravenous Q12H  . terazosin  1 mg Oral QHS   Continuous Infusions:  PRN Meds:.HYDROcodone-acetaminophen, HYDROcodone-acetaminophen, LORazepam, nitroGLYCERIN   Physical Exam: Filed Vitals:   01/13/13 1000  BP: 122/45  Pulse: 76  Temp:   Resp: 21  Gen'l - elderly white woman on BiPAP in no distress but a little agitated.  Intake/Output Summary (Last 24 hours) at 01/13/13 1131 Last data filed at 01/13/13 0900  Gross per 24 hour  Intake    180 ml  Output   2900 ml  Net  -2720 ml   HEENT- red lower eyelid left Cor - RRR PUlm - on BiPAP, no audible wheeze. Abd - soft      Assessment/Plan: 1. Anemia - HGb holding steady at 8.1 g  2. Cardiac - CHF with diuresis of almost 3 liters.  PLan Will decrease lasix to bid.  3. PUlm - has been dependent on BiPAP for 30+ hrs. She  has diuresed Plan Attempt to wean of BiPAP - keep sats 80%+  Started the conversation about where we go if unable to come off BiPAP. Will continue to discuss and consider coming of BiPAP tomorrow even if sats are low.    [name] [place] [facility] (o) [phone]; (c) [phone] Call-grp - Patsi Sears IM  Tele: [phone]  01/13/2013, 11:30 AM

## 2013-01-14 LAB — HEMOGLOBIN AND HEMATOCRIT, BLOOD
HCT: 27.1 % — ABNORMAL LOW (ref 36.0–46.0)
Hemoglobin: 7.9 g/dL — ABNORMAL LOW (ref 12.0–15.0)
Hemoglobin: 8.3 g/dL — ABNORMAL LOW (ref 12.0–15.0)

## 2013-01-14 MED ORDER — MORPHINE SULFATE (CONCENTRATE) 10 MG /0.5 ML PO SOLN
5.0000 mg | ORAL | Status: DC | PRN
  Administered 2013-01-14: 5 mg via ORAL
  Filled 2013-01-14: qty 0.5

## 2013-01-14 NOTE — Progress Notes (Signed)
[facility] note Patient has been under hospice care since July 2014. She lives with her son who is pcg. He has some health problems, yet has been managing her care to the best of his abilities. Son had just arrived to the room upon arrival and patient was leaning over to see him, her mask continues to come off. She voiced worry about his health and he tried to reassure her that hew as going to get help. Son and patient shared a desire to return home upon discharge. Son shared no concerns and ongoing ability to manage as they had been prior to hospitalization. Patient adamantly stated that she wanted to return home too. Support offered to both and continued follow up. Orson Gear, [place]  [phone]

## 2013-01-14 NOTE — Progress Notes (Signed)
Subjective: O2 sats were satisfactory during the day 10/5 on rebreather mask. Due to tachypnea she was put back to BiPAP overnight. She c/o thirst and seems just a little disoriented.  Objective: Lab:  Recent Labs  01/12/13 1925 01/13/13 0800 01/13/13 1815  HGB 8.8* 8.1* 7.5*  HCT 27.3* 25.3* 23.8*    Recent Labs  01/13/13 0625  NA 137  K 3.0*  CL 97  GLUCOSE 104*  BUN 32*  CREATININE 1.27*  CALCIUM 8.9    Imaging:  Scheduled Meds: . amLODipine  10 mg Oral Daily  . antiseptic oral rinse  15 mL Mouth Rinse BID  . aspirin  81 mg Oral Daily  . atorvastatin  20 mg Oral q1800  . ezetimibe  10 mg Oral Daily  . furosemide  40 mg Oral Daily  . loratadine  10 mg Oral Daily  . LORazepam  0.5 mg Oral BID  . metoprolol tartrate  50 mg Oral BID  . pantoprazole  80 mg Oral Q1200  . ramipril  10 mg Oral Daily  . rOPINIRole  0.5 mg Oral QHS  . senna-docusate  2 tablet Oral QHS  . sertraline  50 mg Oral Daily  . sodium chloride  3 mL Intravenous Q12H  . terazosin  1 mg Oral QHS   Continuous Infusions:  PRN Meds:.HYDROcodone-acetaminophen, HYDROcodone-acetaminophen, LORazepam, nitroGLYCERIN   Physical Exam: Filed Vitals:   01/14/13 0600  BP: 104/38  Pulse: 70  Temp: 99.2 F (37.3 C)  Resp: 19   gen'l - elderly woman easily awakened. HEENT_ redness left lower eye lid (chronic) Cor - 2+ radial, RRR Pulm - taken of BiPAP, lungs - CTAP Neuro - oriented to examiner. Lacks insight to her condition      Assessment/Plan: 1. Anemia - Hgb dropped to 7.5 last PM, AM Hgb pending. Plan Will transfuse 1 unit of PRBCs if Hgb drops below 7  2. Cardiac - good diuresis - 3.3 liters. Lungs sound better. Plan Oral furosemide  Will not resume BiPAP  3. EoL care - Darlene [name] is a hospice patient due to her chronic heart failure and recurrent bleeds. At this point will refocus on comfort care and will not resort to continued BiPAP. Will order Morphine conc. To use prn shortness  of breath or tachypnea   Darlene [name] IM (o) [phone]; (c) [phone] Call-grp - Darlene [name] IM  Tele: [phone]  01/14/2013, 7:21 AM

## 2013-01-14 NOTE — Progress Notes (Signed)
When I arrived, RN had taken patient off BiPAP per MD, and placed on NRB. She stated they just wanted to make her more comfortable. Patient seems to be doing ok, SAT 98% on NRB, but drops into the 70's when she takes it off. RT will continue to monitor.

## 2013-01-14 NOTE — Progress Notes (Signed)
Room 2C15 - Darlene [name] - [facility] RN Visit-R.Jodene Polyak RN  NON-RELATED and NON-COVERED ADMISSION of anemia/GI bleed.  [facility] diagnosis CHF.   Pt is DNR code.    Pt alert, forgetful, lying upright in bed, with no complaints of pain or discomfort.    Son present. Patient's home medication list is on shadow chart.   Pt admitted with anemia/recurrent GI bleed - Hgb 5.3 upon admission.  Pt transfused and developed flash pulmonary edema per Attending MD.  Pt on NRB mask, confused, attempts to get out of bed.  Pt VERY HARD OF HEARING. Pt has had sitters until today - son in the room and will need to leave due to his medical issues.  Advised staff RN who will install bed alarm for patient's safety.    Please call [facility] @ [phone]- ask for RN Liaison or after hours,ask for on-call RN with any hospice needs.   Thank you.  [name], RN  [facility]  Hospice Liaison  [phone])

## 2013-01-14 NOTE — Evaluation (Signed)
Physical Therapy Evaluation Patient Details Name: Darlene [name] MRN: [id number] DOB: 1921/02/28 Today's Date: 01/14/2013 Time: 0865-7846 PT Time Calculation (min): 23 min  PT Assessment / Plan / Recommendation History of Present Illness  Darlene [name] is an 79 y.o. female with hx of lower GI bleed, hx of crohn's and colon ca, CAD with recent NSTEMI, hospice care patient with DNR code status, with goal of care outlined by Dr Debby Bud including no invasive intervention, transfusion as necessary, and continue with end of life care, presents to the ER with chest tightness and weakness.  She is otherwise doing quite well.   Evalation in the ER included an EKG which showed ischemic changes, a Hb of 5.3 grams per DL, and a Cr of 1.4. Her CXR showed some interstitial edema  Clinical Impression  Very pleasant lady who was eager to get OOB but demonstrates confusion and lack of personal abilities. Pt states son is there most of the time but does leave to run errands. Pt needs 24hr supervision and recommend use of RW with gait but will try cane next time to see if any improvement. Will follow acutely to maximize mobility, strength, function and activity tolerance prior to discharge to decrease burden of care.     PT Assessment  Patient needs continued PT services    Follow Up Recommendations  Supervision/Assistance - 24 hour;Home health PT    Does the patient have the potential to tolerate intense rehabilitation      Barriers to Discharge Decreased caregiver support      Equipment Recommendations  Rolling walker with 5" wheels    Recommendations for Other Services     Frequency Min 3X/week    Precautions / Restrictions Precautions Precautions: Fall   Pertinent Vitals/Pain No pain sats 90-97% with 3 brief drops to 87% which rebounded with standing rest on 15L NRB HR 77      Mobility  Bed Mobility Bed Mobility: Supine to Sit Supine to Sit: 4: Min assist;HOB flat;With rails Details  for Bed Mobility Assistance: increased time with assist to fully elevate trunk Transfers Transfers: Sit to Stand;Stand to Sit Sit to Stand: 4: Min guard;From bed Stand to Sit: 4: Min guard;To chair/3-in-1 Details for Transfer Assistance: cueing for hand placement and safety Ambulation/Gait Ambulation/Gait Assistance: 4: Min assist Ambulation Distance (Feet): 150 Feet Assistive device: Rolling walker Ambulation/Gait Assistance Details: cueing to step into RW and extend trunk. min assist for directional changes and cueing for directions to room Gait Pattern: Step-through pattern;Decreased stride length;Trunk flexed;Shuffle;Narrow base of support Gait velocity: decreased Stairs: No    Exercises     PT Diagnosis: Difficulty walking;Generalized weakness  PT Problem List: Decreased strength;Decreased activity tolerance;Decreased balance;Decreased mobility;Cardiopulmonary status limiting activity;Decreased knowledge of use of DME;Decreased safety awareness PT Treatment Interventions: Gait training;DME instruction;Stair training;Therapeutic activities;Functional mobility training;Patient/family education;Therapeutic exercise     PT Goals(Current goals can be found in the care plan section) Acute Rehab PT Goals Patient Stated Goal: return home PT Goal Formulation: With patient Time For Goal Achievement: 01/28/13 Potential to Achieve Goals: Fair  Visit Information  Last PT Received On: 01/14/13 Assistance Needed: +1 History of Present Illness: Darlene [name] is an 79 y.o. female with hx of lower GI bleed, hx of crohn's and colon ca, CAD with recent NSTEMI, hospice care patient with DNR code status, with goal of care outlined by Dr Debby Bud including no invasive intervention, transfusion as necessary, and continue with end of life care, presents to the ER with chest  tightness and weakness.  She is otherwise doing quite well.   Evalation in the ER included an EKG which showed ischemic changes,  a Hb of 5.3 grams per DL, and a Cr of 1.4. Her CXR showed some interstitial edema       Prior Functioning  Home Living Family/patient expects to be discharged to:: Private residence Living Arrangements: Children Available Help at Discharge: Available 24 hours/day;Family Type of Home: House Home Access: Stairs to enter [employer] of Steps: 4 uses wall and assistance of another. Entrance Stairs-Rails: None Home Layout: One level Home Equipment: Cane - single point;Bedside commode;Grab bars - toilet;Grab bars - tub/shower;Shower seat Prior Function Level of Independence: Independent with assistive device(s) Comments: pt states she stands in the shower to bathe, son does the cleaning and she helps with dishes and laundry. Neither cook. She receives meals on wheels or son gets someone to drive him to pick up something Communication Communication: [facility]    Cognition  Cognition Arousal/Alertness: Awake/alert Behavior During Therapy: WFL for tasks assessed/performed Overall Cognitive Status: Impaired/Different from baseline Area of Impairment: Orientation;Safety/judgement;Problem solving Orientation Level: Time;Situation Memory: Decreased short-term memory Safety/Judgement: Decreased awareness of safety Problem Solving: Slow processing    Extremity/Trunk Assessment Upper Extremity Assessment Upper Extremity Assessment: Generalized weakness Lower Extremity Assessment Lower Extremity Assessment: Generalized weakness Cervical / Trunk Assessment Cervical / Trunk Assessment: Kyphotic   Balance    End of Session PT - End of Session Activity Tolerance: Patient tolerated treatment well Patient left: in chair;with call bell/phone within reach;with nursing/sitter in room Nurse Communication: Mobility status  GP     Delorse Lek 01/14/2013, 2:34 PM Delaney Meigs, PT [phone]

## 2013-01-15 DIAGNOSIS — I10 Essential (primary) hypertension: Secondary | ICD-10-CM

## 2013-01-15 DIAGNOSIS — J810 Acute pulmonary edema: Secondary | ICD-10-CM

## 2013-01-15 LAB — TYPE AND SCREEN
ABO/RH(D): A POS
Antibody Screen: NEGATIVE
Unit division: 0
Unit division: 0
Unit division: 0
Unit division: 0
Unit division: 0

## 2013-01-15 MED ORDER — POLYETHYLENE GLYCOL 3350 17 G PO PACK
17.0000 g | PACK | Freq: Every day | ORAL | Status: DC
  Administered 2013-01-15 – 2013-01-16 (×2): 17 g via ORAL
  Filled 2013-01-15 (×2): qty 1

## 2013-01-15 NOTE — Progress Notes (Signed)
Patient began screaming, "Get me out of here. They are here to kill me." continuously. Upon arrival to patients room patient was trying to get out of bed and go into the hall. Patient was reassured verbally and she sat in the chair. Patient was still screaming and tearful. Patient was given ativan. Patient was asking to call her son. Son was called and was reassured patient was given anxiety medication. Son was told to come visit if possible for a familiar face and to call back for updates. Patient is currently calm, sitting in the chair with sitter.

## 2013-01-15 NOTE — Clinical Documentation Improvement (Signed)
THIS DOCUMENT IS NOT A PERMANENT PART OF THE MEDICAL RECORD  Please update your documentation with the medical record to reflect your response to this query. If you need help knowing how to do this please call [phone].  01/15/13  Dear Dr. [name],   In a better effort to capture your patient's severity of illness, reflect appropriate length of stay and utilization of resources, a review of the patient medical record has revealed the following indicators the diagnosis of Heart Failure.    Based on your clinical judgment, please clarify and document in a progress note and/or discharge summary the clinical condition associated with the following supporting information: In your Progress Note from 01/12/13, you documented "Events noted- flash pulmonary edema seems the working diagnosis."  In the Assessment/Plan, you documented "2 additional units were ordered but not given due to acute CHF." You also documented in your Progress Note from 01/13/13, "Cardiac - CHF with diuresis of almost 3 liters.  Plan will decrease lasix to bid."  In responding to this query please exercise your independent judgment.  The fact that a query is asked, does not imply that any particular answer is desired or expected.  Possible Clinical Conditions?     Acute on Chronic Systolic Congestive Heart Failure    Acute on Chronic Diastolic Congestive Heart Failure    Acute on Chronic Systolic & Diastolic  Congestive Heart Failure    Cannot Clinically Determine  Supporting Information: A 2-D Echo done on 05/24/2012, showed an LV EF of 35-40%. In the left ventricle, systolic function was moderately reduced. There was diffuse hypokinesis, and a grade 1 diastolic dysfunction.  In the right ventricle, systolic pressure was increased consistent with moderate pulmonary hypertension.    You may use possible, probable, or suspect with inpatient documentation. Possible, probable, suspected diagnoses MUST be documented at the  time of discharge.   Reviewed: additional documentation in the medical record-see discharge summary  Thank You,  Darla Lesches, RN, BSN, CCRN Clinical Documentation Improvement Specialist HIM department--[place] [facility] [phone]

## 2013-01-15 NOTE — Progress Notes (Signed)
Subjective: Easily awakened. No distress. No reports of any events  Objective: Lab:  Recent Labs  01/13/13 1815 01/14/13 0902 01/14/13 1847  HGB 7.5* 7.9* 8.3*  HCT 23.8* 25.5* 27.1*    Recent Labs  01/13/13 0625  NA 137  K 3.0*  CL 97  GLUCOSE 104*  BUN 32*  CREATININE 1.27*  CALCIUM 8.9    Imaging:  Scheduled Meds: . amLODipine  10 mg Oral Daily  . antiseptic oral rinse  15 mL Mouth Rinse BID  . aspirin  81 mg Oral Daily  . atorvastatin  20 mg Oral q1800  . ezetimibe  10 mg Oral Daily  . furosemide  40 mg Oral Daily  . loratadine  10 mg Oral Daily  . LORazepam  0.5 mg Oral BID  . metoprolol tartrate  50 mg Oral BID  . pantoprazole  80 mg Oral Q1200  . ramipril  10 mg Oral Daily  . rOPINIRole  0.5 mg Oral QHS  . senna-docusate  2 tablet Oral QHS  . sertraline  50 mg Oral Daily  . sodium chloride  3 mL Intravenous Q12H  . terazosin  1 mg Oral QHS   Continuous Infusions:  PRN Meds:.HYDROcodone-acetaminophen, HYDROcodone-acetaminophen, LORazepam, morphine CONCENTRATE, nitroGLYCERIN   Physical Exam: Filed Vitals:   01/15/13 0424  BP: 95/36  Pulse: 58  Temp: 97.9 F (36.6 C)  Resp: 16    Intake/Output Summary (Last 24 hours) at 01/15/13 1610 Last data filed at 01/15/13 0200  Gross per 24 hour  Intake    773 ml  Output   1202 ml  Net   -429 ml   Total this adm: -3.7 l  Gen'l - very elderly white woman in no distress HEENT_ bad left eyelid-no change Cor- 2+ radial RRR PUlm 0n on rebreather mask. No labored respirations Abd- soft Neuro - Awakens easily, knows exmainer      Assessment/Plan: 1. Anemia - last HGb 8.3 at 1800 hrs 10/6. AM Hgb pending  2. Cardiac - pulmonary edema appears resolved. Diuresed 3.7 liters  Plan  Reduce oral lasix to home dose  Change FiO2 to 3 liters [place] - as long as she maintains O2 sat 80% or greater  Hopefully home tomorrow. Had O2 at home.   [name] [place] [facility] (o) [phone]; (c) [phone] Call-grp  - Patsi Sears IM  Tele: [phone]  01/15/2013, 6:30 AM

## 2013-01-15 NOTE — Progress Notes (Addendum)
Room 2C15 - Darlene [name] - [facility] RN Visit-R.Kita Neace RN  NON-RELATED and NON-COVERED ADMISSION of anemia/GI bleed. [facility] diagnosis CHF. Pt is DNR code.   Pt alert, confused, very HOH, sitting recumbent in lounge chair with sitter present.  Pt has no complaints of pain or discomfort.    No family present.  Patient's home medication list is on shadow chart.   Per EPIC notes, pt may discharge back to her home tomorrow 10/8 with her son assuming the PCG responsibility.   Please call [facility] @ [phone]- ask for RN Liaison or after hours,ask for on-call RN with any hospice needs.   Thank you.  [name], RN  [facility]  Hospice Liaison  [phone])

## 2013-01-15 NOTE — Progress Notes (Signed)
Physical Therapy Treatment Patient Details Name: Darlene [name] MRN: [id number] DOB: 10-15-1920 Today's Date: 01/15/2013 Time: 0726-0753 PT Time Calculation (min): 27 min  PT Assessment / Plan / Recommendation  History of Present Illness Darlene [name] is an 79 y.o. female with hx of lower GI bleed, hx of crohn's and colon ca, CAD with recent NSTEMI, hospice care patient with DNR code status, with goal of care outlined by Dr Debby Bud including no invasive intervention, transfusion as necessary, and continue with end of life care, presents to the ER with chest tightness and weakness.  She is otherwise doing quite well.   Evalation in the ER included an EKG which showed ischemic changes, a Hb of 5.3 grams per DL, and a Cr of 1.4. Her CXR showed some interstitial edema   PT Comments   Pt progressing with mobility with difficulty picking up O2 sat due to probe on foot. Pt continues to require min assist for all mobility and son will need to be able to provide assist at this level for safe DC home with use of RW at all times. Will continue to follow.   Follow Up Recommendations  Supervision/Assistance - 24 hour;Home health PT     Does the patient have the potential to tolerate intense rehabilitation     Barriers to Discharge        Equipment Recommendations       Recommendations for Other Services    Frequency     Progress towards PT Goals Progress towards PT goals: Progressing toward goals  Plan Current plan remains appropriate    Precautions / Restrictions Precautions Precautions: Fall   Pertinent Vitals/Pain sats 85-99% on NRB 15L HR 77 No pain    Mobility  Bed Mobility Supine to Sit: 5: Supervision;With rails;HOB flat Details for Bed Mobility Assistance: cueing for sequence with increased time Transfers Sit to Stand: 4: Min assist;From bed Stand to Sit: 4: Min guard;To chair/3-in-1;With armrests Details for Transfer Assistance: cueing for hand placement and  safety Ambulation/Gait Ambulation/Gait Assistance: 4: Min assist Ambulation Distance (Feet): 150 Feet Assistive device: Rolling walker Ambulation/Gait Assistance Details: Ambulated first 20' with cane with increased posterior lean and mod assist with return to RW pt able to maintain min assist for ambulation with assist to direct RW and constant cues for posture and position in RW Gait Pattern: Step-through pattern;Decreased stride length;Trunk flexed;Shuffle;Narrow base of support Gait velocity: decreased Stairs: Yes Stairs Assistance: 3: Mod assist Stairs Assistance Details (indicate cue type and reason): cueing for sequence, pt unable to maintain hand on wall as she does at home and kept reaching for rail, mod assist to prevent posterior LOB Stair Management Technique: One rail Right;Forwards;Step to pattern;Other (comment) (hand held assist left) Number of Stairs: 3    Exercises General Exercises - Lower Extremity Long Arc Quad: AROM;Both;10 reps;Seated   PT Diagnosis:    PT Problem List:   PT Treatment Interventions:     PT Goals (current goals can now be found in the care plan section)    Visit Information  Last PT Received On: 01/15/13 Assistance Needed: +1 History of Present Illness: Darlene [name] is an 79 y.o. female with hx of lower GI bleed, hx of crohn's and colon ca, CAD with recent NSTEMI, hospice care patient with DNR code status, with goal of care outlined by Dr Debby Bud including no invasive intervention, transfusion as necessary, and continue with end of life care, presents to the ER with chest tightness and weakness.  She  is otherwise doing quite well.   Evalation in the ER included an EKG which showed ischemic changes, a Hb of 5.3 grams per DL, and a Cr of 1.4. Her CXR showed some interstitial edema    Subjective Data      Cognition  Cognition Arousal/Alertness: Awake/alert Behavior During Therapy: WFL for tasks assessed/performed Overall Cognitive Status:  Impaired/Different from baseline Area of Impairment: Orientation;Safety/judgement;Problem solving Orientation Level: Time;Situation Memory: Decreased short-term memory Safety/Judgement: Decreased awareness of safety Problem Solving: Slow processing    Balance     End of Session PT - End of Session Equipment Utilized During Treatment: Gait belt;Oxygen Activity Tolerance: Patient tolerated treatment well Patient left: in chair;with call bell/phone within reach;with nursing/sitter in room Nurse Communication: Mobility status   GP     Toney Sang [facility] 01/15/2013, 9:01 AM Delaney Meigs, PT [phone]

## 2013-01-16 DIAGNOSIS — I2510 Atherosclerotic heart disease of native coronary artery without angina pectoris: Secondary | ICD-10-CM

## 2013-01-16 DIAGNOSIS — C44111 Basal cell carcinoma of skin of unspecified eyelid, including canthus: Secondary | ICD-10-CM

## 2013-01-16 DIAGNOSIS — R079 Chest pain, unspecified: Secondary | ICD-10-CM

## 2013-01-16 LAB — BASIC METABOLIC PANEL
CO2: 31 mEq/L (ref 19–32)
Chloride: 95 mEq/L — ABNORMAL LOW (ref 96–112)
Creatinine, Ser: 1.5 mg/dL — ABNORMAL HIGH (ref 0.50–1.10)
GFR calc non Af Amer: 29 mL/min — ABNORMAL LOW (ref >90)
Glucose, Bld: 94 mg/dL (ref 70–99)
Potassium: 3.1 mEq/L — ABNORMAL LOW (ref 3.5–5.1)
Sodium: 136 mEq/L (ref 135–145)

## 2013-01-16 NOTE — Progress Notes (Signed)
Subjective: No respiratory distress. Good satrs  Objective: Lab:  Recent Labs  01/13/13 1815 01/14/13 0902 01/14/13 1847  HGB 7.5* 7.9* 8.3*  HCT 23.8* 25.5* 27.1*   No results found for this basename: NA, K, CL, CO3, GLUCOSE, BUN, CREATININE, CALCIUM, MG, PHOS,  in the last 72 hours  Imaging:  Scheduled Meds: . amLODipine  10 mg Oral Daily  . antiseptic oral rinse  15 mL Mouth Rinse BID  . aspirin  81 mg Oral Daily  . atorvastatin  20 mg Oral q1800  . ezetimibe  10 mg Oral Daily  . furosemide  40 mg Oral Daily  . loratadine  10 mg Oral Daily  . LORazepam  0.5 mg Oral BID  . metoprolol tartrate  50 mg Oral BID  . pantoprazole  80 mg Oral Q1200  . polyethylene glycol  17 g Oral Daily  . ramipril  10 mg Oral Daily  . rOPINIRole  0.5 mg Oral QHS  . senna-docusate  2 tablet Oral QHS  . sertraline  50 mg Oral Daily  . sodium chloride  3 mL Intravenous Q12H  . terazosin  1 mg Oral QHS   Continuous Infusions:  PRN Meds:.HYDROcodone-acetaminophen, HYDROcodone-acetaminophen, LORazepam, morphine CONCENTRATE, nitroGLYCERIN   Physical Exam: Filed Vitals:   01/16/13 0619  BP: 105/51  Pulse: 51  Temp: 98.6 F (37 C)  Resp: 18   See d/c summary     Assessment/Plan: For d/c to home with hospice to resume care.   Dictated #[id number]   [name] [facility] (o) [phone]; (c) [phone] Call-grp - Patsi Sears IM  Tele: [phone]  01/16/2013, 8:11 AM

## 2013-01-16 NOTE — Discharge Summary (Signed)
Darlene [name], Darlene [name]             ACCOUNT NO.:  [id number]  MEDICAL RECORD NO.:  [id number]  LOCATION:  5W26C                        FACILITY:  [facility]  PHYSICIAN:  Rosalyn Gess. Norins, MD  DATE OF BIRTH:  [birth year]  DATE OF ADMISSION:  01/11/2013 DATE OF DISCHARGE:  01/16/2013                              DISCHARGE SUMMARY   ADMITTING DIAGNOSES: 1. Significant anemia. 2. Stress-induced angina. 3. History of colon cancer. 4. Hypertension.  DISCHARGE DIAGNOSES: 1. Significant anemia. 2. Stress-induced angina. 3. History of colon cancer. 4. Hypertension. 5. Acute on chronic systolic heart failure with pulmonary edema  HISTORY OF PRESENT ILLNESS:  Darlene [name] is a 79 year old woman who has a history of AVMs with recurrent GI bleed.  She has had significant problems with angina and also nonSTEMI  when her hemoglobin drops. Patient because of her end-stage heart disease, has been followed by hospice.  The patient presented to the emergency department because of increasing weakness, tightness in her chest.  In the ER, she was found to have hemoglobin of 5.3 g, and EKG showed ischemic changes.  Chest x- ray revealed minimal interstitial edema.  The patient was admitted for transfusion and monitoring.  She is not a candidate for any cardiac interventions.  Please see the H and P, as well as previous epic records for past medical history, family history, and social history.  HOSPITAL COURSE: 1. Anemia.  The patient with significant anemia most likely secondary     to an AVMs as well as anemia of chronic disease.  She was     transfused 2 units of packed red blood cells.  Her hemoglobin did     stabilize after 2 units at an acceptable level with final     hemoglobin being 8.3 g on October 6. 2. Cardiac, the patient with stress-induced ischemia and angina.  She     had a troponin at admission that was less than 0.30.  BNP was 5785.     The patient did not require cardiology  consultation or any cardiac     intervention. 3. Acute on chronic systolic heart failure. With patient receiving 2     units of blood this put her into fluid overload and flash     pulmonary edema.  The patient did require BiPAP support for     approximately 36 hours.  She was a vigorously diuresed with IV     Lasix and was able to put out a total of 3.9 L.  With diuresis     patient's respiratory status improved.  She was weaned off of BiPAP     to 100% rebreather and then to nasal cannula at 3 L.  On nasal     cannula, her oxygen saturation was stable between 88% and 93%.     With her pulmonary edema being resolved, at this point she is     stable and can be discharged to home.  DISCHARGE EXAMINATION:  VITAL SIGNS:  Temperature was 98.6.  Blood pressure 105/51, heart rate 51, respirations 18, and oxygen saturation was 93%. GENERAL APPEARANCE:  This is an elderly woman in no acute distress. 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  Conjunctiva and sclerae were otherwise clear.  The patient is hard of hearing. NECK:  Supple. CHEST:  Patient has got no deformities, but mild kyphosis is noted. PULMONARY:  Patient has normal respirations.  She has no increased work of breathing.  She has no rales, wheezes, or rhonchi. CARDIOVASCULAR:  2+ radial pulse.  Her precordium is quiet.  Heart sounds were distant but regular. BREASTS:  Deferred. ABDOMEN:  Soft.  No guarding or rebound. GENITALIA:  Deferred.  EXTREMITIES:  Without peripheral edema. NEURO:  Patient is awake, alert.  She is very hard of hearing.  She does seem oriented. SKIN:  The patient had no signs of skin breakdown, but sacrum was not examined.  DISCHARGE MEDICATIONS:   Norvasc 10 mg p.o. daily,  aspirin 81 mg daily, Nexium 40 mg q.a.m.,  Zetia 10 mg daily,  Lasix 40 mg daily,  Norco 5/325 b.i.d. p.r.n.,  Claritin 10 mg daily,  lorazepam 0.5 mg b.i.d.,  Toprol-XL100 mg once daily,   multivitamins daily,  sublingual nitroglycerin 0.4 mg tabs p.r.n.,  Pravachol 80 mg daily,  Altace 10 mg daily,  ropinirole 0.5mg  at bedtime,  Senokot 2 tablets at bedtime,  sertraline 50 mg daily, Hytrin 1 mg at bedtime,  Kenalog 0.1% cream apply topically b.i.d. as needed for skin lesion.  The patient will be discharged to home, and she is [facility] who will resume her care.  The patient will be seen in followup as needed with home visits.  We will discuss at next home visit reducing patient's medication burden given her advanced age and poor prognosis.  The patient is a DNR and will be sent home with the out of facility sheet.    The patient's condition at time of discharge dictation is medically stable, but guarded given her multiple medical problems.     Rosalyn Gess Norins, MD     MEN/MEDQ  D:  01/16/2013  T:  01/16/2013  Job:  [id number]  cc:   [facility]

## 2013-01-16 NOTE — Progress Notes (Signed)
Patient discharged.  Educated on discharge instructions, medications, and home health.  No new prescriptions needed.  IV discontinued.  Patient belongings gathered.  Patient to be discharged home to home hospice.  Escorted off unit via stretcher to ambulance ride home.

## 2013-01-16 NOTE — Progress Notes (Addendum)
Room [facility] 5W26 - Darlene [name] - [facility] RN Visit-R.Dynasty Holquin RN  NON-RELATED and NON-COVERED ADMISSION of anemia/GI bleed. [facility] diagnosis CHF. Pt is DMR code.  Pt alert, sitting upright in bed, some confusion, with no complaints of pain or discomfort.    No family present. Patient's home medication list is on shadow chart.   Pt states she is going home today via ambulance.  States her son is in too much pain with his back to help her get home.  [facility] SW and RN and RN Mgr aware of discharge today.   Discharge instructions posted in [facility] Clinical notes.   Please call [facility] @ [phone]- ask for RN Liaison or after hours,ask for on-call RN with any hospice needs.   Thank you.  [name], RN  [facility]  Hospice Liaison  [phone])

## 2013-01-16 NOTE — Progress Notes (Signed)
[facility] Social Work note LCSW made brief supportive visit. Patient was up on bsc and then assisted back to bed. She shared that she is going home today and voiced relief. She voiced a hope that her son is aware of the plans. Patient voiced no concerns for herself, yet hope that her son is seeking medical care for his bad back. She did ask for her belongings and LCSW encouraged waiting for nursing staff to get paperwork together and that the staff will help her get dressed to return home. Patient reports no pain, breahing difficulties. Hospice to continue to follow upon return home. [place]son [place], [place]   [phone]

## 2013-01-19 ENCOUNTER — Other Ambulatory Visit: Payer: Self-pay | Admitting: Internal Medicine

## 2013-01-24 ENCOUNTER — Telehealth: Payer: Self-pay | Admitting: *Deleted

## 2013-01-24 NOTE — Telephone Encounter (Signed)
[facility] called requesting order for PRN O2.  States pt is on continious O2, after walk w/o O2 pulse Ox 94%; at rest with O2 pulse ox 99%.  Please advise

## 2013-01-25 NOTE — Telephone Encounter (Signed)
Change to prn use of oxygen for dyspnea. If does well for 2 weeks can d/c oxygen

## 2013-01-25 NOTE — Telephone Encounter (Signed)
Spoke with Teress advised of MDs order

## 2013-02-04 ENCOUNTER — Telehealth: Payer: Self-pay | Admitting: Internal Medicine

## 2013-02-04 MED ORDER — HYDROCODONE-ACETAMINOPHEN 5-325 MG PO TABS: 1.0000 | ORAL_TABLET | Freq: Two times a day (BID) | ORAL | Status: DC | PRN

## 2013-02-04 MED ORDER — METOPROLOL SUCCINATE ER 100 MG PO TB24: 100.0000 mg | ORAL_TABLET | Freq: Every day | ORAL | Status: AC

## 2013-02-04 NOTE — Telephone Encounter (Signed)
Theres, nurse from home health called request refill for hydrocodone (6 left) and metoprolol (4 left). Please advise.

## 2013-02-04 NOTE — Telephone Encounter (Signed)
Yes it is ok. Will need to mail - they do not have transportation.

## 2013-02-04 NOTE — Telephone Encounter (Signed)
Metoprolol script sent to pharmacy. Okay to refill Hydrocodone?

## 2013-02-04 NOTE — Telephone Encounter (Signed)
SCRIPTS PRINTED WAITING FOR SIG AND WILL BE MAILED

## 2013-02-05 ENCOUNTER — Telehealth: Payer: Self-pay

## 2013-02-05 MED ORDER — HYDROCODONE-ACETAMINOPHEN 5-325 MG PO TABS: 1.0000 | ORAL_TABLET | Freq: Two times a day (BID) | ORAL | Status: DC | PRN

## 2013-02-05 NOTE — Telephone Encounter (Signed)
Phone call from Joylene Igo [phone] nursing director states patient will be running out of her Hydrocodone soon. I let her know 3 scripts were mailed yesterday to be taken to the pharmacy. She states son is concerned that no one will be able to get to the pharmacy for her. Per Dr Debby Bud Sherlyn Lick [phone] ( who goes out to see the patient) is able to take these scripts to the pharmacy. Dr Debby Bud states to fax a script for Hydrocodone for 1 weeks worth to the pharmacy since scripts have already been mailed.

## 2013-02-08 ENCOUNTER — Emergency Department (HOSPITAL_COMMUNITY)

## 2013-02-08 ENCOUNTER — Inpatient Hospital Stay (HOSPITAL_COMMUNITY)
Admission: EM | Admit: 2013-02-08 | Discharge: 2013-02-12 | DRG: 481 | Disposition: A | Discharge status: Hospice | Attending: Internal Medicine | Admitting: Internal Medicine

## 2013-02-08 ENCOUNTER — Telehealth: Payer: Self-pay | Admitting: Internal Medicine

## 2013-02-08 ENCOUNTER — Encounter (HOSPITAL_COMMUNITY): Payer: Self-pay | Admitting: Emergency Medicine

## 2013-02-08 ENCOUNTER — Inpatient Hospital Stay (HOSPITAL_COMMUNITY): Admit: 2013-02-08 | Source: Other Acute Inpatient Hospital | Admitting: Internal Medicine

## 2013-02-08 DIAGNOSIS — N179 Acute kidney failure, unspecified: Secondary | ICD-10-CM | POA: Diagnosis present

## 2013-02-08 DIAGNOSIS — I509 Heart failure, unspecified: Secondary | ICD-10-CM | POA: Diagnosis present

## 2013-02-08 DIAGNOSIS — R5381 Other malaise: Secondary | ICD-10-CM | POA: Diagnosis present

## 2013-02-08 DIAGNOSIS — I10 Essential (primary) hypertension: Secondary | ICD-10-CM | POA: Diagnosis present

## 2013-02-08 DIAGNOSIS — H548 Legal blindness, as defined in USA: Secondary | ICD-10-CM | POA: Diagnosis present

## 2013-02-08 DIAGNOSIS — Z85038 Personal history of other malignant neoplasm of large intestine: Secondary | ICD-10-CM

## 2013-02-08 DIAGNOSIS — E785 Hyperlipidemia, unspecified: Secondary | ICD-10-CM | POA: Diagnosis present

## 2013-02-08 DIAGNOSIS — I739 Peripheral vascular disease, unspecified: Secondary | ICD-10-CM | POA: Diagnosis present

## 2013-02-08 DIAGNOSIS — J309 Allergic rhinitis, unspecified: Secondary | ICD-10-CM

## 2013-02-08 DIAGNOSIS — D638 Anemia in other chronic diseases classified elsewhere: Secondary | ICD-10-CM | POA: Diagnosis present

## 2013-02-08 DIAGNOSIS — Q279 Congenital malformation of peripheral vascular system, unspecified: Secondary | ICD-10-CM

## 2013-02-08 DIAGNOSIS — S72143A Displaced intertrochanteric fracture of unspecified femur, initial encounter for closed fracture: Principal | ICD-10-CM

## 2013-02-08 DIAGNOSIS — I2510 Atherosclerotic heart disease of native coronary artery without angina pectoris: Secondary | ICD-10-CM | POA: Diagnosis present

## 2013-02-08 DIAGNOSIS — W19XXXA Unspecified fall, initial encounter: Secondary | ICD-10-CM | POA: Diagnosis present

## 2013-02-08 DIAGNOSIS — Z79899 Other long term (current) drug therapy: Secondary | ICD-10-CM

## 2013-02-08 DIAGNOSIS — I5032 Chronic diastolic (congestive) heart failure: Secondary | ICD-10-CM | POA: Diagnosis present

## 2013-02-08 DIAGNOSIS — I5030 Unspecified diastolic (congestive) heart failure: Secondary | ICD-10-CM | POA: Diagnosis present

## 2013-02-08 DIAGNOSIS — D649 Anemia, unspecified: Secondary | ICD-10-CM

## 2013-02-08 DIAGNOSIS — Z7982 Long term (current) use of aspirin: Secondary | ICD-10-CM

## 2013-02-08 DIAGNOSIS — F411 Generalized anxiety disorder: Secondary | ICD-10-CM | POA: Diagnosis present

## 2013-02-08 DIAGNOSIS — C44111 Basal cell carcinoma of skin of unspecified eyelid, including canthus: Secondary | ICD-10-CM

## 2013-02-08 DIAGNOSIS — Z515 Encounter for palliative care: Secondary | ICD-10-CM

## 2013-02-08 DIAGNOSIS — I252 Old myocardial infarction: Secondary | ICD-10-CM

## 2013-02-08 DIAGNOSIS — K219 Gastro-esophageal reflux disease without esophagitis: Secondary | ICD-10-CM | POA: Diagnosis present

## 2013-02-08 DIAGNOSIS — R627 Adult failure to thrive: Secondary | ICD-10-CM | POA: Diagnosis present

## 2013-02-08 DIAGNOSIS — Z66 Do not resuscitate: Secondary | ICD-10-CM | POA: Diagnosis present

## 2013-02-08 LAB — CBC WITH DIFFERENTIAL/PLATELET
Basophils Absolute: 0 10*3/uL (ref 0.0–0.1)
Basophils Relative: 0 % (ref 0–1)
Eosinophils Absolute: 0 10*3/uL (ref 0.0–0.7)
HCT: 27.4 % — ABNORMAL LOW (ref 36.0–46.0)
Hemoglobin: 8.9 g/dL — ABNORMAL LOW (ref 12.0–15.0)
Lymphs Abs: 1.1 10*3/uL (ref 0.7–4.0)
MCH: 27.5 pg (ref 26.0–34.0)
MCV: 84.6 fL (ref 78.0–100.0)
Monocytes Relative: 6 % (ref 3–12)
Neutro Abs: 5.7 10*3/uL (ref 1.7–7.7)
Neutrophils Relative %: 78 % — ABNORMAL HIGH (ref 43–77)
RBC: 3.24 MIL/uL — ABNORMAL LOW (ref 3.87–5.11)
RDW: 19.5 % — ABNORMAL HIGH (ref 11.5–15.5)

## 2013-02-08 LAB — COMPREHENSIVE METABOLIC PANEL
ALT: 9 U/L (ref 0–35)
AST: 20 U/L (ref 0–37)
Albumin: 3.5 g/dL (ref 3.5–5.2)
Alkaline Phosphatase: 76 U/L (ref 39–117)
Calcium: 8.8 mg/dL (ref 8.4–10.5)
Chloride: 98 mEq/L (ref 96–112)
Glucose, Bld: 173 mg/dL — ABNORMAL HIGH (ref 70–99)
Potassium: 3.9 mEq/L (ref 3.5–5.1)
Sodium: 135 mEq/L (ref 135–145)
Total Protein: 6.7 g/dL (ref 6.0–8.3)

## 2013-02-08 LAB — URINE MICROSCOPIC-ADD ON

## 2013-02-08 LAB — URINALYSIS, ROUTINE W REFLEX MICROSCOPIC
Glucose, UA: NEGATIVE mg/dL
Specific Gravity, Urine: 1.01 (ref 1.005–1.030)
pH: 5 (ref 5.0–8.0)

## 2013-02-08 MED ORDER — LORAZEPAM 0.5 MG PO TABS
0.5000 mg | ORAL_TABLET | Freq: Two times a day (BID) | ORAL | Status: DC | PRN
  Administered 2013-02-09 – 2013-02-12 (×3): 0.5 mg via ORAL
  Filled 2013-02-08 (×3): qty 1

## 2013-02-08 MED ORDER — ENOXAPARIN SODIUM 30 MG/0.3ML ~~LOC~~ SOLN
30.0000 mg | SUBCUTANEOUS | Status: DC
  Administered 2013-02-10 – 2013-02-12 (×2): 30 mg via SUBCUTANEOUS
  Filled 2013-02-08 (×5): qty 0.3

## 2013-02-08 MED ORDER — FENTANYL CITRATE 0.05 MG/ML IJ SOLN
25.0000 ug | Freq: Once | INTRAMUSCULAR | Status: AC
  Administered 2013-02-08: 25 ug via INTRAVENOUS
  Filled 2013-02-08: qty 2

## 2013-02-08 MED ORDER — TERAZOSIN HCL 1 MG PO CAPS
1.0000 mg | ORAL_CAPSULE | Freq: Every day | ORAL | Status: DC
  Administered 2013-02-09 – 2013-02-11 (×3): 1 mg via ORAL
  Filled 2013-02-08 (×5): qty 1

## 2013-02-08 MED ORDER — LORATADINE 10 MG PO TABS
10.0000 mg | ORAL_TABLET | Freq: Every day | ORAL | Status: DC
  Administered 2013-02-10 – 2013-02-12 (×3): 10 mg via ORAL
  Filled 2013-02-08 (×4): qty 1

## 2013-02-08 MED ORDER — HYDROCODONE-ACETAMINOPHEN 5-325 MG PO TABS
1.0000 | ORAL_TABLET | Freq: Four times a day (QID) | ORAL | Status: DC | PRN
  Administered 2013-02-09 – 2013-02-10 (×3): 1 via ORAL
  Filled 2013-02-08 (×3): qty 1

## 2013-02-08 MED ORDER — MORPHINE SULFATE 2 MG/ML IJ SOLN
0.5000 mg | INTRAMUSCULAR | Status: DC | PRN
  Administered 2013-02-09 (×2): 0.5 mg via INTRAVENOUS
  Filled 2013-02-08 (×2): qty 1

## 2013-02-08 MED ORDER — SENNOSIDES-DOCUSATE SODIUM 8.6-50 MG PO TABS
2.0000 | ORAL_TABLET | Freq: Every day | ORAL | Status: DC
  Administered 2013-02-09 – 2013-02-10 (×2): 2 via ORAL
  Filled 2013-02-08 (×2): qty 2

## 2013-02-08 MED ORDER — ASPIRIN 81 MG PO CHEW
81.0000 mg | CHEWABLE_TABLET | Freq: Every day | ORAL | Status: DC
  Administered 2013-02-10 – 2013-02-12 (×3): 81 mg via ORAL
  Filled 2013-02-08 (×4): qty 1

## 2013-02-08 MED ORDER — METOPROLOL SUCCINATE ER 100 MG PO TB24
100.0000 mg | ORAL_TABLET | Freq: Every day | ORAL | Status: DC
  Filled 2013-02-08 (×2): qty 1

## 2013-02-08 MED ORDER — ROPINIROLE HCL 0.5 MG PO TABS
0.5000 mg | ORAL_TABLET | Freq: Every day | ORAL | Status: DC
  Administered 2013-02-09 – 2013-02-11 (×3): 0.5 mg via ORAL
  Filled 2013-02-08 (×5): qty 1

## 2013-02-08 MED ORDER — AMLODIPINE BESYLATE 10 MG PO TABS
10.0000 mg | ORAL_TABLET | Freq: Every day | ORAL | Status: DC
  Administered 2013-02-10 – 2013-02-12 (×3): 10 mg via ORAL
  Filled 2013-02-08 (×4): qty 1

## 2013-02-08 MED ORDER — SERTRALINE HCL 50 MG PO TABS
50.0000 mg | ORAL_TABLET | Freq: Every day | ORAL | Status: DC
  Administered 2013-02-10 – 2013-02-12 (×3): 50 mg via ORAL
  Filled 2013-02-08 (×4): qty 1

## 2013-02-08 MED ORDER — PANTOPRAZOLE SODIUM 40 MG PO TBEC
40.0000 mg | DELAYED_RELEASE_TABLET | Freq: Every day | ORAL | Status: DC
  Administered 2013-02-10 – 2013-02-12 (×3): 40 mg via ORAL
  Filled 2013-02-08 (×2): qty 1

## 2013-02-08 NOTE — ED Notes (Signed)
Per [facility] pt from home, pt fell backwards from a standing position, pt landed on a carpeted floor, pt denies dizziness, LOC, or neck pain. Pt states she thinks she stepped on something. Pt c/o Left hip pain, shortening, rotation, and swelling noted to Left hip, pt has a hx of Right hip replacement. 22g Left hand, Fentanyl 150 mcg given en route, EMS applied a sheet around her hips for stability. VSS BP 140/91, HR 70, RR 16, 92 % RA applied 2 L O2 via [place]

## 2013-02-08 NOTE — ED Provider Notes (Signed)
CSN: [id number]     Arrival date & time 02/08/13  1815 History   First MD Initiated Contact with Patient 02/08/13 1855     Chief Complaint  Patient presents with  . Fall   (Consider location/radiation/quality/duration/timing/severity/associated sxs/prior Treatment) Patient is a 79 y.o. female presenting with fall. The history is provided by the patient.  Fall This is a new problem. The current episode started 1 to 2 hours ago. Episode frequency: once. The problem has not changed since onset.Pertinent negatives include no chest pain, no abdominal pain, no headaches and no shortness of breath. Nothing aggravates the symptoms. Nothing relieves the symptoms. She has tried nothing for the symptoms. The treatment provided no relief.    Past Medical History  Diagnosis Date  . UTI (urinary tract infection)   . IBS (irritable bowel syndrome)   . Abdominal pain, left lower quadrant   . CAD (coronary artery disease)   . Pneumonia   . HTN (hypertension)   . Hyperlipidemia   . Diverticulosis of colon   . History of colon cancer   . Allergic rhinitis   . Angina   . Cancer   . Arthritis   . Shortness of breath   . Anemia   . Blood transfusion   . Anxiety   . CHF (congestive heart failure)   . GERD (gastroesophageal reflux disease)   . Peripheral vascular disease    Past Surgical History  Procedure Laterality Date  . Hemicolectomy  1991    Right for mgt of colon cancer.  . Angioplasty  1989    Stent x 2  . Appendectomy    . Bilateral salpingoophorectomy    . Breast surgery      benign  . Orif hip fracture  12/2009    Dr Charlann Boxer  . Esophagogastroduodenoscopy  08/09/2011    Procedure: ESOPHAGOGASTRODUODENOSCOPY (EGD);  Surgeon: Hart Carwin, MD;  Location: [facility] ENDOSCOPY;  Service: Endoscopy;  Laterality: N/A;  . Colonoscopy  10/24/2011    Procedure: COLONOSCOPY;  Surgeon: Beverley Fiedler, MD;  Location: [facility] ENDOSCOPY;  Service: Gastroenterology;  Laterality: N/A;  . Colon surgery      Family History  Problem Relation Age of Onset  . Colon cancer Neg Hx   . Breast cancer Neg Hx    History  Substance Use Topics  . Smoking status: Never Smoker   . Smokeless tobacco: Never Used  . Alcohol Use: No   OB History   Grav Para Term Preterm Abortions TAB SAB Ect Mult Living                 Review of Systems  Constitutional: Negative for fever and fatigue.  HENT: Negative for congestion and drooling.   Eyes: Negative for pain.  Respiratory: Negative for cough and shortness of breath.   Cardiovascular: Negative for chest pain.  Gastrointestinal: Negative for nausea, vomiting, abdominal pain and diarrhea.  Genitourinary: Negative for dysuria and hematuria.  Musculoskeletal: Negative for back pain, gait problem and neck pain.  Skin: Negative for color change.  Neurological: Negative for dizziness and headaches.  Hematological: Negative for adenopathy.  Psychiatric/Behavioral: Negative for behavioral problems.  All other systems reviewed and are negative.    Allergies  Antihistamines, chlorpheniramine-type and Chocolate  Home Medications   Current Outpatient Rx  Name  Route  Sig  Dispense  Refill  . amLODipine (NORVASC) 10 MG tablet   Oral   Take 1 tablet (10 mg total) by mouth daily.   30 tablet  5   . aspirin 81 MG chewable tablet   Oral   Chew 1 tablet (81 mg total) by mouth daily.   30 tablet   5   . esomeprazole (NEXIUM) 40 MG capsule   Oral   Take 40 mg by mouth daily before breakfast.         . ezetimibe (ZETIA) 10 MG tablet   Oral   Take 10 mg by mouth at bedtime.         . furosemide (LASIX) 40 MG tablet   Oral   Take 1 tablet (40 mg total) by mouth daily.   30 tablet   5   . HYDROcodone-acetaminophen (NORCO/VICODIN) 5-325 MG per tablet   Oral   Take 1 tablet by mouth 2 (two) times daily as needed for pain.         . hydrocortisone cream (PREPARATION H HYDROCORTISONE) 1 %   Topical   Apply 1 application topically 4 (four)  times daily as needed (bleeding hemorrhoids).         . loratadine (CLARITIN) 10 MG tablet   Oral   Take 1 tablet (10 mg total) by mouth daily.   30 tablet   5   . LORazepam (ATIVAN) 0.5 MG tablet   Oral   Take 0.5 mg by mouth 2 (two) times daily as needed for anxiety.          . metoprolol succinate (TOPROL-XL) 100 MG 24 hr tablet   Oral   Take 1 tablet (100 mg total) by mouth daily.   30 tablet   5   . Multiple Vitamin (MULTIVITAMIN WITH MINERALS) TABS tablet   Oral   Take 1 tablet by mouth daily. Centrum Silver         . nitroGLYCERIN (NITROSTAT) 0.4 MG SL tablet   Sublingual   Place 0.4 mg under the tongue every 5 (five) minutes as needed for chest pain.         . pravastatin (PRAVACHOL) 80 MG tablet   Oral   Take 80 mg by mouth at bedtime.         . ramipril (ALTACE) 10 MG tablet   Oral   Take 1 tablet (10 mg total) by mouth daily.   30 tablet   5   . rOPINIRole (REQUIP) 0.5 MG tablet   Oral   Take 0.5 mg by mouth at bedtime.         . senna-docusate (SENOKOT-S) 8.6-50 MG per tablet   Oral   Take 2 tablets by mouth at bedtime.   60 tablet   11   . sertraline (ZOLOFT) 50 MG tablet   Oral   Take 50 mg by mouth daily.         Marland Kitchen terazosin (HYTRIN) 1 MG capsule   Oral   Take 1 capsule (1 mg total) by mouth at bedtime.   30 capsule   5    BP 147/64  Pulse 69  Temp(Src) 98.2 F (36.8 C) (Oral)  Resp 16  Ht 5\' 5"  (1.651 m)  Wt 114 lb (51.71 kg)  BMI 18.97 kg/m2  SpO2 93% Physical Exam  Nursing note and vitals reviewed. Constitutional: She is oriented to person, place, and time. She appears well-developed and well-nourished.  HENT:  Head: Normocephalic.  Mouth/Throat: Oropharynx is clear and moist. No oropharyngeal exudate.  Eyes: Conjunctivae and EOM are normal. Pupils are equal, round, and reactive to light.  Neck: Normal range of motion. Neck supple.  Cardiovascular:  Normal rate, regular rhythm, normal heart sounds and intact  distal pulses.  Exam reveals no gallop and no friction rub.   No murmur heard. Pulmonary/Chest: Effort normal and breath sounds normal. No respiratory distress. She has no wheezes.  Abdominal: Soft. Bowel sounds are normal. There is no tenderness. There is no rebound and no guarding.  Musculoskeletal: Normal range of motion. She exhibits no edema and no tenderness.  Left lateral hip pain. Left leg is shortened and externally rotated slightly on exam.   2+ distal pulses.   No vertebral ttp.   Normal strength in all extremities. LLE was not tested for strength d/t pain.   Neurological: She is alert and oriented to person, place, and time.  Skin: Skin is warm and dry.  Psychiatric: She has a normal mood and affect. Her behavior is normal.    ED Course  Procedures (including critical care time) Labs Review Labs Reviewed  CBC WITH DIFFERENTIAL - Abnormal; Notable for the following:    RBC 3.24 (*)    Hemoglobin 8.9 (*)    HCT 27.4 (*)    RDW 19.5 (*)    Platelets 146 (*)    Neutrophils Relative % 78 (*)    All other components within normal limits  COMPREHENSIVE METABOLIC PANEL - Abnormal; Notable for the following:    Glucose, Bld 173 (*)    Creatinine, Ser 1.30 (*)    GFR calc non Af Amer 35 (*)    GFR calc Af Amer 41 (*)    All other components within normal limits  PROTIME-INR  URINALYSIS W MICROSCOPIC + REFLEX CULTURE   Imaging Review Ct Head Wo Contrast  02/08/2013   CLINICAL DATA:  Fall  EXAM: CT HEAD WITHOUT CONTRAST  CT CERVICAL SPINE WITHOUT CONTRAST  TECHNIQUE: Multidetector CT imaging of the head and cervical spine was performed following the standard protocol without intravenous contrast. Multiplanar CT image reconstructions of the cervical spine were also generated.  COMPARISON:  06/11/2010  FINDINGS: CT HEAD FINDINGS  Mild cortical volume loss noted with proportional ventricular prominence. Areas of periventricular white matter hypodensity are most compatible with  small vessel ischemic change. Bilateral remote external capsule lacunar infarcts are reidentified. No midline shift. No skull fracture. Orbits and paranasal sinuses are intact.  CT CERVICAL SPINE FINDINGS  C1 through the cervicothoracic junction is visualized in its entirety. No precervical soft tissue widening. Inferior endplate compression deformity at C5 again noted, not significantly changed. C6-C7 disk degenerative change noted with minimal neural foraminal narrowing reidentified. No fracture suspect no acute fracture or dislocation. Mild motion artifact noted at the mid cervical spine.  IMPRESSION: No acute intracranial finding.  No acute cervical spine abnormality.   Electronically Signed   By: Christiana Pellant M.D.   On: 02/08/2013 20:09   Ct Cervical Spine Wo Contrast  02/08/2013   CLINICAL DATA:  Fall  EXAM: CT HEAD WITHOUT CONTRAST  CT CERVICAL SPINE WITHOUT CONTRAST  TECHNIQUE: Multidetector CT imaging of the head and cervical spine was performed following the standard protocol without intravenous contrast. Multiplanar CT image reconstructions of the cervical spine were also generated.  COMPARISON:  06/11/2010  FINDINGS: CT HEAD FINDINGS  Mild cortical volume loss noted with proportional ventricular prominence. Areas of periventricular white matter hypodensity are most compatible with small vessel ischemic change. Bilateral remote external capsule lacunar infarcts are reidentified. No midline shift. No skull fracture. Orbits and paranasal sinuses are intact.  CT CERVICAL SPINE FINDINGS  C1 through the cervicothoracic junction  is visualized in its entirety. No precervical soft tissue widening. Inferior endplate compression deformity at C5 again noted, not significantly changed. C6-C7 disk degenerative change noted with minimal neural foraminal narrowing reidentified. No fracture suspect no acute fracture or dislocation. Mild motion artifact noted at the mid cervical spine.  IMPRESSION: No acute  intracranial finding.  No acute cervical spine abnormality.   Electronically Signed   By: Christiana Pellant M.D.   On: 02/08/2013 20:09    EKG Interpretation     Ventricular Rate:  71 PR Interval:  154 QRS Duration: 132 QT Interval:  477 QTC Calculation: 518 R Axis:   96 Text Interpretation:  Sinus rhythm Nonspecific intraventricular conduction delay Borderline repolarization abnormality ST abnormalities in inferior leads  and V5 V6 no longer seen            MDM   1. Intertrochanteric fracture, left, closed, initial encounter   2. Allergic rhinitis, cause unspecified   3. Anemia    8:14 PM 79 y.o. female and who presents with a fall which occurred prior to arrival. The patient landed on carpeted floor, denies hitting her head, denies loss of consciousness. She describes a mechanical fall but is not sure what she fell on and is reporting falling backwards while in the standing position. She is afebrile and vital signs are unremarkable here. Complaining of left hip pain. Will get imaging, fentanyl for pain, screening labwork.  Found to have left hip fx. Son states pt had right hip repair by [facility] several years ago. I consulted them for eval, they will see pt tomorrow. Will admit to hospitalist.    Junius Argyle, MD 02/09/13 1104

## 2013-02-08 NOTE — Telephone Encounter (Signed)
02/08/2013  Teresa from [facility] left VM regarding pt's hydrocodone rx.  Rosey Bath wanted [place] to know that pharmacy would only fill 1 script at a time, so Rosey Bath left 2 hard copies with pt's son.   Rosey Bath also stated that in the future, the pharmacy would accept a faxed copy of hydrocodone if you write "[facility]" on the script before faxing.

## 2013-02-08 NOTE — H&P (Signed)
[facility] History and Physical  Darlene [name] NWG:[id number] DOB: 1920-10-23 DOA: 02/08/2013  Referring physician: ED physician PCP: [name], MD   Chief Complaint: Fall  HPI:  Pt is 79 yo female who [facility] ED after the fall at home and has subsequently suffered left hip fracture. Pt is somewhat poor historian but denies chest pain or shortness of breath, no abdominal or urinary concerns, no fevers, chills, no specific focal neurological symptoms. Pt reports pain in the left hip area, constant and 10/10 in severity, non radiating and worse with movement, no specific alleviating factors.   Assessment and Plan: Active Problems:   Intertrochanteric fracture - left femur, ortho consulted and will follow upon recommendations  - analgesia as needed  - bed rest for now but if tolerating may need PT  Fall  - likely mechanical and secondary to progressive failure to thrive and deconditioning - will admit to medical floor - please note that pt is under hospice care and is DNR - comfort care will be ensured   Anemia of chronic disease  - no signs of acute bleeding - monitor H/H closely , CBC in AM  Acute renal failure - will hold ACEI and Lasix for now as pt is clinically dry - may resume if BP tolerates and if renal function improved, will let primary team decide   Code Status: DNR, under hospice care  Family Communication: Pt at bedside Disposition Plan: PT evaluation    Review of Systems:  Constitutional: Negative for fever, chills and malaise/fatigue. Negative for diaphoresis.  HENT: Negative for hearing loss, ear pain, nosebleeds, congestion, sore throat, neck pain, tinnitus and ear discharge.   Eyes: Negative for blurred vision, double vision, photophobia, pain, discharge and redness.  Respiratory: Negative for cough, hemoptysis, wheezing and stridor.   Cardiovascular: Negative for chest pain, palpitations, orthopnea, claudication and leg swelling.   Gastrointestinal: Negative for nausea, vomiting and abdominal pain. Genitourinary: Negative for dysuria, urgency, frequency, hematuria and flank pain.  Musculoskeletal: Negative for myalgias, back pain, joint pain.  Skin: Negative for itching and rash.  Neurological: Negative for tingling, tremors, sensory change, speech change, and headaches.  Endo/Heme/Allergies: Negative for environmental allergies and polydipsia. Does not bruise/bleed easily.  Psychiatric/Behavioral: Negative for suicidal ideas. The patient is not nervous/anxious.      Past Medical History  Diagnosis Date  . UTI (urinary tract infection)   . IBS (irritable bowel syndrome)   . Abdominal pain, left lower quadrant   . CAD (coronary artery disease)   . Pneumonia   . HTN (hypertension)   . Hyperlipidemia   . Diverticulosis of colon   . History of colon cancer   . Allergic rhinitis   . Angina   . Cancer   . Arthritis   . Shortness of breath   . Anemia   . Blood transfusion   . Anxiety   . CHF (congestive heart failure)   . GERD (gastroesophageal reflux disease)   . Peripheral vascular disease     Past Surgical History  Procedure Laterality Date  . Hemicolectomy  1991    Right for mgt of colon cancer.  . Angioplasty  1989    Stent x 2  . Appendectomy    . Bilateral salpingoophorectomy    . Breast surgery      benign  . Orif hip fracture  12/2009    Dr Charlann Boxer  . Esophagogastroduodenoscopy  08/09/2011    Procedure: ESOPHAGOGASTRODUODENOSCOPY (EGD);  Surgeon: Hart Carwin, MD;  Location: [facility] ENDOSCOPY;  Service: Endoscopy;  Laterality: N/A;  . Colonoscopy  10/24/2011    Procedure: COLONOSCOPY;  Surgeon: Beverley Fiedler, MD;  Location: [facility] ENDOSCOPY;  Service: Gastroenterology;  Laterality: N/A;  . Colon surgery      Social History:  reports that she has never smoked. She has never used smokeless tobacco. She reports that she does not drink alcohol or use illicit drugs.  Allergies  Allergen Reactions  .  Antihistamines, Chlorpheniramine-Type Other (See Comments)    unknown  . Chocolate Other (See Comments)    Pt states "makes her sick" and she starts coughing    Family History  Problem Relation Age of Onset  . Colon cancer Neg Hx   . Breast cancer Neg Hx     Prior to Admission medications   Medication Sig Start Date End Date Taking? Authorizing Provider  amLODipine (NORVASC) 10 MG tablet Take 1 tablet (10 mg total) by mouth daily. 07/19/12  Yes Jacques Navy, MD  aspirin 81 MG chewable tablet Chew 1 tablet (81 mg total) by mouth daily. 07/19/12  Yes Jacques Navy, MD  esomeprazole (NEXIUM) 40 MG capsule Take 40 mg by mouth daily before breakfast.   Yes Historical Provider, MD  ezetimibe (ZETIA) 10 MG tablet Take 10 mg by mouth at bedtime.   Yes Historical Provider, MD  furosemide (LASIX) 40 MG tablet Take 1 tablet (40 mg total) by mouth daily. 07/19/12  Yes Jacques Navy, MD  HYDROcodone-acetaminophen (NORCO/VICODIN) 5-325 MG per tablet Take 1 tablet by mouth 2 (two) times daily as needed for pain.   Yes Historical Provider, MD  hydrocortisone cream (PREPARATION H HYDROCORTISONE) 1 % Apply 1 application topically 4 (four) times daily as needed (bleeding hemorrhoids).   Yes Historical Provider, MD  loratadine (CLARITIN) 10 MG tablet Take 1 tablet (10 mg total) by mouth daily. 01/04/13  Yes Jacques Navy, MD  LORazepam (ATIVAN) 0.5 MG tablet Take 0.5 mg by mouth 2 (two) times daily as needed for anxiety.  01/10/13  Yes Jacques Navy, MD  metoprolol succinate (TOPROL-XL) 100 MG 24 hr tablet Take 1 tablet (100 mg total) by mouth daily. 02/04/13  Yes Jacques Navy, MD  Multiple Vitamin (MULTIVITAMIN WITH MINERALS) TABS tablet Take 1 tablet by mouth daily. Centrum Silver   Yes Historical Provider, MD  nitroGLYCERIN (NITROSTAT) 0.4 MG SL tablet Place 0.4 mg under the tongue every 5 (five) minutes as needed for chest pain.   Yes Historical Provider, MD  pravastatin (PRAVACHOL) 80 MG  tablet Take 80 mg by mouth at bedtime.   Yes Historical Provider, MD  ramipril (ALTACE) 10 MG tablet Take 1 tablet (10 mg total) by mouth daily. 07/19/12  Yes Jacques Navy, MD  rOPINIRole (REQUIP) 0.5 MG tablet Take 0.5 mg by mouth at bedtime.   Yes Historical Provider, MD  senna-docusate (SENOKOT-S) 8.6-50 MG per tablet Take 2 tablets by mouth at bedtime. 10/13/12  Yes Jacques Navy, MD  sertraline (ZOLOFT) 50 MG tablet Take 50 mg by mouth daily.   Yes Historical Provider, MD  terazosin (HYTRIN) 1 MG capsule Take 1 capsule (1 mg total) by mouth at bedtime. 07/19/12  Yes Jacques Navy, MD    Physical Exam: Filed Vitals:   02/08/13 2100 02/08/13 2115 02/08/13 2130 02/08/13 2145  BP: 145/94 129/60 153/91 130/57  Pulse: 77 73 73 66  Temp:      TempSrc:      Resp:  Height:      Weight:      SpO2: 100% 99% 100% 100%    Physical Exam  Constitutional: Appears stable and in no acute distress  HENT: Normocephalic. External right and left ear normal. Dry MM Eyes: Conjunctivae and EOM are normal. PERRLA, no scleral icterus.  Neck: Normal ROM. Neck supple. No JVD. No tracheal deviation. No thyromegaly.  CVS: RRR, S1/S2 +, no murmurs, no gallops, no carotid bruit.  Pulmonary: Effort and breath sounds normal, diminished air movement at bases  Abdominal: Soft. BS +,  no distension, tenderness, rebound or guarding.  Musculoskeletal: Normal range of motion. Tenderness at the left hip area Lymphadenopathy: No lymphadenopathy noted, cervical, inguinal. Neuro: Normal reflexes, muscle tone coordination. No cranial nerve deficit. Skin: Skin is warm and dry. No rash noted. Not diaphoretic. No erythema. No pallor.  Psychiatric: Normal mood and affect.   Labs on Admission:  Basic Metabolic Panel:  Recent Labs Lab 02/08/13 1928  NA 135  K 3.9  CL 98  CO2 27  GLUCOSE 173*  BUN 23  CREATININE 1.30*  CALCIUM 8.8   Liver Function Tests:  Recent Labs Lab 02/08/13 1928  AST 20  ALT  9  ALKPHOS 76  BILITOT 0.3  PROT 6.7  ALBUMIN 3.5   CBC:  Recent Labs Lab 02/08/13 1928  WBC 7.3  NEUTROABS 5.7  HGB 8.9*  HCT 27.4*  MCV 84.6  PLT 146*   Radiological Exams on Admission: Dg Chest 1 View  02/08/2013   CLINICAL DATA:  Proximal left femoral pain and deformity post fall today  EXAM: CHEST - 1 VIEW  COMPARISON:  01/13/2013  FINDINGS: Enlargement of cardiac silhouette.  Atherosclerotic calcification aorta.  Mediastinal contours and pulmonary vascularity normal.  Subsegmental atelectasis at right upper lobe and at right base.  No acute infiltrate, pleural effusion or pneumothorax.  Mild central peribronchial thickening.  Bones demineralized.  IMPRESSION: Enlargement of cardiac silhouette.  Bronchitic changes with scattered atelectasis.   Electronically Signed   By: Ulyses Southward M.D.   On: 02/08/2013 21:18   Dg Pelvis 1-2 Views  02/08/2013   CLINICAL DATA:  Left proximal femoral pain and deformity post fall today  EXAM: PELVIS - 1-2 VIEW  COMPARISON:  12/10/2008  FINDINGS: Diffuse osseous demineralization.  Right hip prosthesis new since previous exam.  A wall foreign body protuberance is identified along the lateral margin of the proximal right femur, this not appear related to acute injury.  Displaced intertrochanteric fracture left femur with varus angulation.  Narrowing of left hip joint.  Visualized pelvis intact.  Rotated to the left.  Scattered vascular calcification.  IMPRESSION: Displaced angulated intertrochanteric fracture left femur.  Osseous demineralization with degenerative changes of the left hip joint.  Post right hip replacement.   Electronically Signed   By: Ulyses Southward M.D.   On: 02/08/2013 21:17   Dg Femur Left  02/08/2013   CLINICAL DATA:  Larey Seat today, proximal left femoral pain and deformity  EXAM: LEFT FEMUR - 2 VIEW  COMPARISON:  None  FINDINGS: Displaced intertrochanteric fracture left femur with varus angulation.  No dislocation.  Narrowing of left hip  joint.  Remainder of left femur appears intact with knee joint alignment normal.  Visualized left pelvis intact.  IMPRESSION: Displaced angulated intertrochanteric fracture left femur.  Osseous demineralization.  Degenerative changes left hip joint.   Electronically Signed   By: Ulyses Southward M.D.   On: 02/08/2013 21:02   Ct Head Wo Contrast  02/08/2013  CLINICAL DATA:  Fall  EXAM: CT HEAD WITHOUT CONTRAST  CT CERVICAL SPINE WITHOUT CONTRAST  TECHNIQUE: Multidetector CT imaging of the head and cervical spine was performed following the standard protocol without intravenous contrast. Multiplanar CT image reconstructions of the cervical spine were also generated.  COMPARISON:  06/11/2010  FINDINGS: CT HEAD FINDINGS  Mild cortical volume loss noted with proportional ventricular prominence. Areas of periventricular white matter hypodensity are most compatible with small vessel ischemic change. Bilateral remote external capsule lacunar infarcts are reidentified. No midline shift. No skull fracture. Orbits and paranasal sinuses are intact.  CT CERVICAL SPINE FINDINGS  C1 through the cervicothoracic junction is visualized in its entirety. No precervical soft tissue widening. Inferior endplate compression deformity at C5 again noted, not significantly changed. C6-C7 disk degenerative change noted with minimal neural foraminal narrowing reidentified. No fracture suspect no acute fracture or dislocation. Mild motion artifact noted at the mid cervical spine.  IMPRESSION: No acute intracranial finding.  No acute cervical spine abnormality.   Electronically Signed   By: Christiana Pellant M.D.   On: 02/08/2013 20:09   Ct Cervical Spine Wo Contrast  02/08/2013   CLINICAL DATA:  Fall  EXAM: CT HEAD WITHOUT CONTRAST  CT CERVICAL SPINE WITHOUT CONTRAST  TECHNIQUE: Multidetector CT imaging of the head and cervical spine was performed following the standard protocol without intravenous contrast. Multiplanar CT image  reconstructions of the cervical spine were also generated.  COMPARISON:  06/11/2010  FINDINGS: CT HEAD FINDINGS  Mild cortical volume loss noted with proportional ventricular prominence. Areas of periventricular white matter hypodensity are most compatible with small vessel ischemic change. Bilateral remote external capsule lacunar infarcts are reidentified. No midline shift. No skull fracture. Orbits and paranasal sinuses are intact.  CT CERVICAL SPINE FINDINGS  C1 through the cervicothoracic junction is visualized in its entirety. No precervical soft tissue widening. Inferior endplate compression deformity at C5 again noted, not significantly changed. C6-C7 disk degenerative change noted with minimal neural foraminal narrowing reidentified. No fracture suspect no acute fracture or dislocation. Mild motion artifact noted at the mid cervical spine.  IMPRESSION: No acute intracranial finding.  No acute cervical spine abnormality.   Electronically Signed   By: Christiana Pellant M.D.   On: 02/08/2013 20:09    EKG: Normal sinus rhythm, no ST/T wave changes  Debbora Presto, MD  [facility] Pager [phone]  If 7PM-7AM, please contact night-coverage [email or web address] Password [email or web address] 02/08/2013, 11:07 PM

## 2013-02-08 NOTE — ED Notes (Signed)
Dr. Romeo Apple at bedside; spine board removed. Lt. Hip stabilized by sheet. Pedal pulses moderate.

## 2013-02-09 ENCOUNTER — Encounter (HOSPITAL_COMMUNITY): Admitting: Anesthesiology

## 2013-02-09 ENCOUNTER — Inpatient Hospital Stay (HOSPITAL_COMMUNITY): Admitting: Anesthesiology

## 2013-02-09 ENCOUNTER — Inpatient Hospital Stay (HOSPITAL_COMMUNITY)

## 2013-02-09 ENCOUNTER — Encounter (HOSPITAL_COMMUNITY)
Admission: EM | Disposition: A | Discharge status: Hospice | Payer: Self-pay | Source: Home / Self Care | Attending: Internal Medicine

## 2013-02-09 HISTORY — PX: INTRAMEDULLARY (IM) NAIL INTERTROCHANTERIC: SHX5875

## 2013-02-09 LAB — BASIC METABOLIC PANEL
BUN: 21 mg/dL (ref 6–23)
CO2: 27 mEq/L (ref 19–32)
Calcium: 9 mg/dL (ref 8.4–10.5)
Chloride: 98 mEq/L (ref 96–112)
Creatinine, Ser: 1.17 mg/dL — ABNORMAL HIGH (ref 0.50–1.10)
GFR calc Af Amer: 46 mL/min — ABNORMAL LOW (ref >90)
GFR calc non Af Amer: 40 mL/min — ABNORMAL LOW (ref >90)
Glucose, Bld: 146 mg/dL — ABNORMAL HIGH (ref 70–99)

## 2013-02-09 LAB — SURGICAL PCR SCREEN: MRSA, PCR: NEGATIVE

## 2013-02-09 LAB — CBC
HCT: 25.8 % — ABNORMAL LOW (ref 36.0–46.0)
MCH: 26.4 pg (ref 26.0–34.0)
MCV: 84 fL (ref 78.0–100.0)
RDW: 19.5 % — ABNORMAL HIGH (ref 11.5–15.5)
WBC: 10.2 10*3/uL (ref 4.0–10.5)

## 2013-02-09 LAB — PREPARE RBC (CROSSMATCH)

## 2013-02-09 SURGERY — FIXATION, FRACTURE, INTERTROCHANTERIC, WITH INTRAMEDULLARY ROD
Anesthesia: General | Laterality: Left | Wound class: Clean

## 2013-02-09 MED ORDER — PHENYLEPHRINE HCL 10 MG/ML IJ SOLN
INTRAMUSCULAR | Status: DC | PRN
  Administered 2013-02-09 (×3): 40 ug via INTRAVENOUS

## 2013-02-09 MED ORDER — LIDOCAINE HCL (CARDIAC) 20 MG/ML IV SOLN
INTRAVENOUS | Status: DC | PRN
  Administered 2013-02-09: 80 mg via INTRAVENOUS

## 2013-02-09 MED ORDER — LACTATED RINGERS IV SOLN
INTRAVENOUS | Status: DC | PRN
  Administered 2013-02-09: 09:00:00 via INTRAVENOUS

## 2013-02-09 MED ORDER — PHENOL 1.4 % MT LIQD: 1.0000 | OROMUCOSAL | Status: DC | PRN

## 2013-02-09 MED ORDER — FENTANYL CITRATE 0.05 MG/ML IJ SOLN
INTRAMUSCULAR | Status: DC | PRN
  Administered 2013-02-09 (×2): 50 ug via INTRAVENOUS

## 2013-02-09 MED ORDER — FENTANYL CITRATE 0.05 MG/ML IJ SOLN: 25.0000 ug | INTRAMUSCULAR | Status: DC | PRN

## 2013-02-09 MED ORDER — MORPHINE SULFATE 2 MG/ML IJ SOLN: 0.5000 mg | INTRAMUSCULAR | Status: DC | PRN

## 2013-02-09 MED ORDER — PROPOFOL 10 MG/ML IV BOLUS
INTRAVENOUS | Status: DC | PRN
  Administered 2013-02-09: 90 mg via INTRAVENOUS

## 2013-02-09 MED ORDER — CHLORHEXIDINE GLUCONATE 4 % EX LIQD
60.0000 mL | Freq: Once | CUTANEOUS | Status: DC
  Filled 2013-02-09: qty 60

## 2013-02-09 MED ORDER — ACETAMINOPHEN 650 MG RE SUPP: 650.0000 mg | Freq: Four times a day (QID) | RECTAL | Status: DC | PRN

## 2013-02-09 MED ORDER — HYDROCODONE-ACETAMINOPHEN 5-325 MG PO TABS
1.0000 | ORAL_TABLET | Freq: Four times a day (QID) | ORAL | Status: DC | PRN
  Administered 2013-02-09 – 2013-02-11 (×7): 1 via ORAL
  Filled 2013-02-09 (×8): qty 1

## 2013-02-09 MED ORDER — ESMOLOL HCL 10 MG/ML IV SOLN
INTRAVENOUS | Status: DC | PRN
  Administered 2013-02-09: 10 mg via INTRAVENOUS

## 2013-02-09 MED ORDER — CLINDAMYCIN PHOSPHATE 900 MG/50ML IV SOLN
900.0000 mg | INTRAVENOUS | Status: AC
  Administered 2013-02-10: 900 mg via INTRAVENOUS
  Filled 2013-02-09: qty 50

## 2013-02-09 MED ORDER — CEFAZOLIN SODIUM-DEXTROSE 2-3 GM-% IV SOLR
2.0000 g | Freq: Two times a day (BID) | INTRAVENOUS | Status: AC
  Administered 2013-02-09: 2 g via INTRAVENOUS
  Filled 2013-02-09 (×2): qty 50

## 2013-02-09 MED ORDER — MENTHOL 3 MG MT LOZG: 1.0000 | LOZENGE | OROMUCOSAL | Status: DC | PRN

## 2013-02-09 MED ORDER — CEFAZOLIN SODIUM-DEXTROSE 2-3 GM-% IV SOLR
2.0000 g | Freq: Once | INTRAVENOUS | Status: AC
  Administered 2013-02-09: 2 g via INTRAVENOUS

## 2013-02-09 MED ORDER — 0.9 % SODIUM CHLORIDE (POUR BTL) OPTIME
TOPICAL | Status: DC | PRN
  Administered 2013-02-09: 1000 mL

## 2013-02-09 MED ORDER — ASPIRIN EC 325 MG PO TBEC: 325.0000 mg | DELAYED_RELEASE_TABLET | Freq: Every day | ORAL | Status: DC

## 2013-02-09 MED ORDER — CEFAZOLIN SODIUM-DEXTROSE 2-3 GM-% IV SOLR
INTRAVENOUS | Status: AC
  Filled 2013-02-09: qty 50

## 2013-02-09 MED ORDER — ACETAMINOPHEN 325 MG PO TABS
650.0000 mg | ORAL_TABLET | Freq: Four times a day (QID) | ORAL | Status: DC | PRN
  Administered 2013-02-09: 325 mg via ORAL
  Administered 2013-02-10: 650 mg via ORAL
  Filled 2013-02-09 (×2): qty 2

## 2013-02-09 MED ORDER — METOCLOPRAMIDE HCL 10 MG PO TABS: 5.0000 mg | ORAL_TABLET | Freq: Three times a day (TID) | ORAL | Status: DC | PRN

## 2013-02-09 MED ORDER — BOOST / RESOURCE BREEZE PO LIQD
1.0000 | Freq: Two times a day (BID) | ORAL | Status: DC
  Administered 2013-02-09 – 2013-02-12 (×4): 1 via ORAL

## 2013-02-09 MED ORDER — FUROSEMIDE 10 MG/ML IJ SOLN
20.0000 mg | Freq: Once | INTRAMUSCULAR | Status: AC
  Administered 2013-02-09: 20 mg via INTRAVENOUS
  Filled 2013-02-09: qty 2

## 2013-02-09 MED ORDER — SODIUM CHLORIDE 0.9 % IV SOLN
INTRAVENOUS | Status: DC
  Administered 2013-02-09: 22:00:00 via INTRAVENOUS

## 2013-02-09 MED ORDER — METOCLOPRAMIDE HCL 5 MG/ML IJ SOLN: 5.0000 mg | Freq: Three times a day (TID) | INTRAMUSCULAR | Status: DC | PRN

## 2013-02-09 MED ORDER — ONDANSETRON HCL 4 MG PO TABS: 4.0000 mg | ORAL_TABLET | Freq: Four times a day (QID) | ORAL | Status: DC | PRN

## 2013-02-09 MED ORDER — ASPIRIN EC 325 MG PO TBEC
325.0000 mg | DELAYED_RELEASE_TABLET | Freq: Every day | ORAL | Status: DC
  Administered 2013-02-10 – 2013-02-12 (×3): 325 mg via ORAL
  Filled 2013-02-09 (×4): qty 1

## 2013-02-09 MED ORDER — ONDANSETRON HCL 4 MG/2ML IJ SOLN: 4.0000 mg | Freq: Four times a day (QID) | INTRAMUSCULAR | Status: DC | PRN

## 2013-02-09 SURGICAL SUPPLY — 35 items
BIT DRILL CANN LG 4.3MM (BIT) IMPLANT
BLADE SURG 15 STRL LF DISP TIS (BLADE) IMPLANT
BLADE SURG 15 STRL SS (BLADE)
CLOTH BEACON ORANGE TIMEOUT ST (SAFETY) ×2 IMPLANT
COVER SURGICAL LIGHT HANDLE (MISCELLANEOUS) ×2 IMPLANT
COVER TABLE BACK 60X90 (DRAPES) IMPLANT
DRAPE C-ARM 42X72 X-RAY (DRAPES) ×1 IMPLANT
DRAPE STERI IOBAN 125X83 (DRAPES) ×3 IMPLANT
DRILL BIT CANN LG 4.3MM (BIT) ×2
DRSG ADAPTIC 3X8 NADH LF (GAUZE/BANDAGES/DRESSINGS) ×2 IMPLANT
DRSG MEPILEX BORDER 4X4 (GAUZE/BANDAGES/DRESSINGS) ×2 IMPLANT
DRSG MEPILEX BORDER 4X8 (GAUZE/BANDAGES/DRESSINGS) ×2 IMPLANT
ELECT REM PT RETURN 9FT ADLT (ELECTROSURGICAL) ×2
ELECTRODE REM PT RTRN 9FT ADLT (ELECTROSURGICAL) ×1 IMPLANT
EVACUATOR 1/8 PVC DRAIN (DRAIN) IMPLANT
GLOVE BIOGEL PI IND STRL 9 (GLOVE) ×1 IMPLANT
GLOVE BIOGEL PI INDICATOR 9 (GLOVE) ×1
GLOVE SURG ORTHO 9.0 STRL STRW (GLOVE) ×2 IMPLANT
GOWN PREVENTION PLUS XLARGE (GOWN DISPOSABLE) ×1 IMPLANT
GOWN SRG XL XLNG 56XLVL 4 (GOWN DISPOSABLE) ×2 IMPLANT
GOWN STRL NON-REIN LRG LVL3 (GOWN DISPOSABLE) ×2 IMPLANT
GOWN STRL NON-REIN XL XLG LVL4 (GOWN DISPOSABLE) ×2
KIT BASIN OR (CUSTOM PROCEDURE TRAY) ×2 IMPLANT
KIT ROOM TURNOVER OR (KITS) ×2 IMPLANT
MANIFOLD NEPTUNE II (INSTRUMENTS) ×1 IMPLANT
NAIL HIP FRACT 130D 11X180 (Screw) ×1 IMPLANT
NS IRRIG 1000ML POUR BTL (IV SOLUTION) ×2 IMPLANT
PACK GENERAL/GYN (CUSTOM PROCEDURE TRAY) ×4 IMPLANT
PAD ARMBOARD 7.5X6 YLW CONV (MISCELLANEOUS) ×4 IMPLANT
SCREW BONE CORTICAL 5.0X3 (Screw) ×2 IMPLANT
SCREW LAG 6.5X80X10.5XLRG ST (Screw) IMPLANT
SCREW LAG 80MM (Screw) ×2 IMPLANT
STAPLER VISISTAT 35W (STAPLE) IMPLANT
SUT VIC AB 2-0 CTB1 (SUTURE) IMPLANT
WATER STERILE IRR 1000ML POUR (IV SOLUTION) IMPLANT

## 2013-02-09 NOTE — Anesthesia Procedure Notes (Signed)
Procedure Name: Intubation Date/Time: 02/09/2013 9:13 AM Performed by: Brien Mates D Pre-anesthesia Checklist: Patient identified, Emergency Drugs available, Suction available, Patient being monitored and Timeout performed Patient Re-evaluated:Patient Re-evaluated prior to inductionOxygen Delivery Method: Circle system utilized Preoxygenation: Pre-oxygenation with 100% oxygen Intubation Type: IV induction Ventilation: Mask ventilation without difficulty LMA: LMA inserted LMA Size: 4.0 Number of attempts: 1 Placement Confirmation: positive ETCO2 and breath sounds checked- equal and bilateral Tube secured with: Tape Dental Injury: Teeth and Oropharynx as per pre-operative assessment

## 2013-02-09 NOTE — Progress Notes (Addendum)
INITIAL NUTRITION ASSESSMENT  DOCUMENTATION CODES Per approved criteria  -Not Applicable   INTERVENTION: Add Resource Breeze po BID, each supplement provides 250 kcal and 9 grams of protein. Diet advancement per team to least restrictive diet. RD to continue to follow nutrition care plan.  NUTRITION DIAGNOSIS: Increased nutrient needs related to post-op healing as evidenced by estimated needs.   Goal: Advance diet as tolerated. Maximize oral intake within GOC.  Monitor:  weight trends, lab trends, I/O's, PO intake, supplement tolerance, diet advancement  Reason for Assessment: MD Consult for Assessment  79 y.o. female  Admitting Dx: fall  ASSESSMENT: PMHx significant for CA, CAD, CHF, GERD. Admitted s/p fall. Work-up reveals intertochanteric fx, underwent repair 11/1 in OR.  Per chart review, pt is under hospice care. Pt is currently ordered for Clear Liquids. She has consumed at least half of her meal. She appears thin with signs of muscle/fat wasting 2/2 advanced age. Pt would benefit from oral nutrition supplement to help with oral intake. Pt is at nutrition risk given advanced age, hospice status, and acute medical issues.  Height: Ht Readings from Last 1 Encounters:  02/08/13 5\' 5"  (1.651 m)    Weight: Wt Readings from Last 1 Encounters:  02/08/13 114 lb (51.71 kg)    Ideal Body Weight: 125 lb  % Ideal Body Weight: 91%  Wt Readings from Last 10 Encounters:  02/08/13 114 lb (51.71 kg)  02/08/13 114 lb (51.71 kg)  01/15/13 114 lb 10.2 oz (52 kg)  11/13/12 113 lb (51.256 kg)  10/12/12 121 lb 7.6 oz (55.1 kg)  07/16/12 122 lb (55.339 kg)  05/25/12 105 lb (47.628 kg)  11/23/11 115 lb (52.164 kg)  11/03/11 113 lb 14.4 oz (51.665 kg)  10/23/11 117 lb 1 oz (53.1 kg)    Usual Body Weight: 115 lb  % Usual Body Weight: 100%  BMI:  Body mass index is 18.97 kg/(m^2). WNL  Estimated Nutritional Needs: Kcal: 1500 - 1700 Protein: 50 - 60 g Fluid: 1.5 - 1.7  liters  Skin: L hip incision  Diet Order: Clear Liquid  EDUCATION NEEDS: -No education needs identified at this time   Intake/Output Summary (Last 24 hours) at 02/09/13 1224 Last data filed at 02/09/13 1035  Gross per 24 hour  Intake    200 ml  Output    700 ml  Net   -500 ml    Last BM: 10/31  Labs:   Recent Labs Lab 02/08/13 1928 02/09/13 0135  NA 135 134*  K 3.9 4.4  CL 98 98  CO2 27 27  BUN 23 21  CREATININE 1.30* 1.17*  CALCIUM 8.8 9.0  GLUCOSE 173* 146*    CBG (last 3)  No results found for this basename: GLUCAP,  in the last 72 hours  Scheduled Meds: . amLODipine  10 mg Oral Daily  . aspirin  81 mg Oral Daily  . ceFAZolin      . ceFAZolin      . enoxaparin (LOVENOX) injection  30 mg Subcutaneous Q24H  . furosemide  20 mg Intravenous Once  . loratadine  10 mg Oral Daily  . metoprolol succinate  100 mg Oral Daily  . pantoprazole  40 mg Oral Daily  . rOPINIRole  0.5 mg Oral QHS  . senna-docusate  2 tablet Oral QHS  . sertraline  50 mg Oral Daily  . terazosin  1 mg Oral QHS    Continuous Infusions:   Past Medical History  Diagnosis Date  .  UTI (urinary tract infection)   . IBS (irritable bowel syndrome)   . Abdominal pain, left lower quadrant   . CAD (coronary artery disease)   . Pneumonia   . HTN (hypertension)   . Hyperlipidemia   . Diverticulosis of colon   . History of colon cancer   . Allergic rhinitis   . Angina   . Cancer   . Arthritis   . Shortness of breath   . Anemia   . Blood transfusion   . Anxiety   . CHF (congestive heart failure)   . GERD (gastroesophageal reflux disease)   . Peripheral vascular disease     Past Surgical History  Procedure Laterality Date  . Hemicolectomy  1991    Right for mgt of colon cancer.  . Angioplasty  1989    Stent x 2  . Appendectomy    . Bilateral salpingoophorectomy    . Breast surgery      benign  . Orif hip fracture  12/2009    Dr Charlann Boxer  . Esophagogastroduodenoscopy  08/09/2011     Procedure: ESOPHAGOGASTRODUODENOSCOPY (EGD);  Surgeon: Hart Carwin, MD;  Location: [facility] ENDOSCOPY;  Service: Endoscopy;  Laterality: N/A;  . Colonoscopy  10/24/2011    Procedure: COLONOSCOPY;  Surgeon: Beverley Fiedler, MD;  Location: [facility] ENDOSCOPY;  Service: Gastroenterology;  Laterality: N/A;  . Colon surgery      Jarold Motto MS, RD, LDN Pager: [phone] After-hours pager: [phone]

## 2013-02-09 NOTE — Op Note (Signed)
OPERATIVE REPORT  DATE OF SURGERY: 02/09/2013  PATIENT:  Darlene [name],  79 y.o. female  PRE-OPERATIVE DIAGNOSIS:  Left Intertrochanteric Hip Fracture  POST-OPERATIVE DIAGNOSIS:  same  PROCEDURE:  Procedure(s): INTRAMEDULLARY (IM) NAIL INTERTROCHANTRIC Biomet nail 11 x 180 mm with an 80 mm proximal screw and 34 mm distal screw  SURGEON:  Surgeon(s): Nadara Mustard, MD  ANESTHESIA:   general  EBL:  min ML  SPECIMEN:  No Specimen  TOURNIQUET:  * No tourniquets in log *  PROCEDURE DETAILS: Patient is a 79 year old woman who had a mechanical fall yesterday was admitted medically stabilized and presents at this time for surgical intervention. Risks and benefits of surgery were discussed with the patient and her son including infection neurovascular injury pain DVT inability to ambulate. Patient and her son state they understand and wish to proceed at this time. Description of procedure patient was brought to the operating room and underwent a general anesthetic. After adequate levels of anesthesia were obtained patient's left lower extremity was prepped using DuraPrep draped into a sterile field the shower curtain was used for the draped patient was placed in boot traction with the right lower extremity in the dorsal lithotomy position. Guidewire was inserted into the greater trochanter C-arm fluoroscopy verified alignment this was overreamed the nail was inserted the guidewire was then was placed center center in the femoral head and this was drilled reamed and the 80 mm screw was inserted. The distal interlocking screw was placed a 34 mm using the alignment jig. C-arm fluoroscopy verified alignment. The wounds were irrigated with normal saline. Subcutaneous is closed using 0 Vicryl skin was closed using staples. A Mepilex dressing was applied. Patient was extubated taken to the PACU in stable condition.  PLAN OF CARE: Admit to inpatient   PATIENT DISPOSITION:  PACU - hemodynamically  stable.   Nadara Mustard, MD 02/09/2013 9:38 AM

## 2013-02-09 NOTE — Progress Notes (Signed)
Subjective: Patient alert, pleasant, no apparent distress. Currently she is postop, she is receiving one unit of packed red blood cells. Estimated blood loss was documented as minimal, last documented hemoglobin 8. Patient had a mechanical fall leading to left hip fracture. Her UA was unremarkable, no sign of UTI. There were no other complaints of chest pain shortness of breath.  Objective: Vital signs in last 24 hours: Temp:  [98 F (36.7 C)-99.1 F (37.3 C)] 98.6 F (37 C) (11/01 1057) Pulse Rate:  [66-79] 73 (11/01 1057) Resp:  [12-20] 12 (11/01 1057) BP: (108-153)/(40-94) 121/45 mmHg (11/01 1057) SpO2:  [93 %-100 %] 97 % (11/01 1057) Weight:  [51.71 kg (114 lb)] 51.71 kg (114 lb) (10/31 1837) Weight change:  Last BM Date: 02/08/13  Intake/Output from previous day: 10/31 0701 - 11/01 0700 In: -  Out: 375 [Urine:375] Intake/Output this shift: Total I/O In: 200 [I.V.:200] Out: 325 [Urine:250; Blood:75]  General appearance: alert and cooperative Resp: clear to auscultation bilaterally Cardio: regular rate and rhythm, S1, S2 normal, no murmur, click, rub or gallop Extremities: extremities normal, atraumatic, no cyanosis or edema and Left hip dressing in place  Lab Results:  Results for orders placed during the hospital encounter of 02/08/13 (from the past 24 hour(s))  CBC WITH DIFFERENTIAL     Status: Abnormal   Collection Time    02/08/13  7:28 PM      Result Value Range   WBC 7.3  4.0 - 10.5 K/uL   RBC 3.24 (*) 3.87 - 5.11 MIL/uL   Hemoglobin 8.9 (*) 12.0 - 15.0 g/dL   HCT 60.4 (*) 54.0 - 98.1 %   MCV 84.6  78.0 - 100.0 fL   MCH 27.5  26.0 - 34.0 pg   MCHC 32.5  30.0 - 36.0 g/dL   RDW 19.1 (*) 47.8 - 29.5 %   Platelets 146 (*) 150 - 400 K/uL   Neutrophils Relative % 78 (*) 43 - 77 %   Neutro Abs 5.7  1.7 - 7.7 K/uL   Lymphocytes Relative 16  12 - 46 %   Lymphs Abs 1.1  0.7 - 4.0 K/uL   Monocytes Relative 6  3 - 12 %   Monocytes Absolute 0.4  0.1 - 1.0 K/uL    Eosinophils Relative 0  0 - 5 %   Eosinophils Absolute 0.0  0.0 - 0.7 K/uL   Basophils Relative 0  0 - 1 %   Basophils Absolute 0.0  0.0 - 0.1 K/uL  COMPREHENSIVE METABOLIC PANEL     Status: Abnormal   Collection Time    02/08/13  7:28 PM      Result Value Range   Sodium 135  135 - 145 mEq/L   Potassium 3.9  3.5 - 5.1 mEq/L   Chloride 98  96 - 112 mEq/L   CO2 27  19 - 32 mEq/L   Glucose, Bld 173 (*) 70 - 99 mg/dL   BUN 23  6 - 23 mg/dL   Creatinine, Ser 6.21 (*) 0.50 - 1.10 mg/dL   Calcium 8.8  8.4 - 30.8 mg/dL   Total Protein 6.7  6.0 - 8.3 g/dL   Albumin 3.5  3.5 - 5.2 g/dL   AST 20  0 - 37 U/L   ALT 9  0 - 35 U/L   Alkaline Phosphatase 76  39 - 117 U/L   Total Bilirubin 0.3  0.3 - 1.2 mg/dL   GFR calc non Af Amer 35 (*) >90 mL/min  GFR calc Af Amer 41 (*) >90 mL/min  PROTIME-INR     Status: None   Collection Time    02/08/13  7:28 PM      Result Value Range   Prothrombin Time 12.9  11.6 - 15.2 seconds   INR 0.99  0.00 - 1.49  URINALYSIS, ROUTINE W REFLEX MICROSCOPIC     Status: Abnormal   Collection Time    02/08/13  9:58 PM      Result Value Range   Color, Urine YELLOW  YELLOW   APPearance CLEAR  CLEAR   Specific Gravity, Urine 1.010  1.005 - 1.030   pH 5.0  5.0 - 8.0   Glucose, UA NEGATIVE  NEGATIVE mg/dL   Hgb urine dipstick TRACE (*) NEGATIVE   Bilirubin Urine NEGATIVE  NEGATIVE   Ketones, ur NEGATIVE  NEGATIVE mg/dL   Protein, ur NEGATIVE  NEGATIVE mg/dL   Urobilinogen, UA 0.2  0.0 - 1.0 mg/dL   Nitrite NEGATIVE  NEGATIVE   Leukocytes, UA NEGATIVE  NEGATIVE  URINE MICROSCOPIC-ADD ON     Status: Abnormal   Collection Time    02/08/13  9:58 PM      Result Value Range   Squamous Epithelial / LPF RARE  RARE   RBC / HPF 0-2  <3 RBC/hpf   Bacteria, UA RARE  RARE   Casts HYALINE CASTS (*) NEGATIVE  CBC     Status: Abnormal   Collection Time    02/09/13  1:35 AM      Result Value Range   WBC 10.2  4.0 - 10.5 K/uL   RBC 3.07 (*) 3.87 - 5.11 MIL/uL    Hemoglobin 8.1 (*) 12.0 - 15.0 g/dL   HCT 96.0 (*) 45.4 - 09.8 %   MCV 84.0  78.0 - 100.0 fL   MCH 26.4  26.0 - 34.0 pg   MCHC 31.4  30.0 - 36.0 g/dL   RDW 11.9 (*) 14.7 - 82.9 %   Platelets 134 (*) 150 - 400 K/uL  BASIC METABOLIC PANEL     Status: Abnormal   Collection Time    02/09/13  1:35 AM      Result Value Range   Sodium 134 (*) 135 - 145 mEq/L   Potassium 4.4  3.5 - 5.1 mEq/L   Chloride 98  96 - 112 mEq/L   CO2 27  19 - 32 mEq/L   Glucose, Bld 146 (*) 70 - 99 mg/dL   BUN 21  6 - 23 mg/dL   Creatinine, Ser 5.62 (*) 0.50 - 1.10 mg/dL   Calcium 9.0  8.4 - 13.0 mg/dL   GFR calc non Af Amer 40 (*) >90 mL/min   GFR calc Af Amer 46 (*) >90 mL/min  SURGICAL PCR SCREEN     Status: None   Collection Time    02/09/13  2:48 AM      Result Value Range   MRSA, PCR NEGATIVE  NEGATIVE   Staphylococcus aureus NEGATIVE  NEGATIVE  PREPARE RBC (CROSSMATCH)     Status: None   Collection Time    02/09/13  8:42 AM      Result Value Range   Order Confirmation ORDER PROCESSED BY BLOOD BANK        Studies/Results: Dg Chest 1 View  02/08/2013   CLINICAL DATA:  Proximal left femoral pain and deformity post fall today  EXAM: CHEST - 1 VIEW  COMPARISON:  01/13/2013  FINDINGS: Enlargement of cardiac silhouette.  Atherosclerotic calcification aorta.  Mediastinal contours and pulmonary vascularity normal.  Subsegmental atelectasis at right upper lobe and at right base.  No acute infiltrate, pleural effusion or pneumothorax.  Mild central peribronchial thickening.  Bones demineralized.  IMPRESSION: Enlargement of cardiac silhouette.  Bronchitic changes with scattered atelectasis.   Electronically Signed   By: Ulyses Southward M.D.   On: 02/08/2013 21:18   Dg Pelvis 1-2 Views  02/08/2013   CLINICAL DATA:  Left proximal femoral pain and deformity post fall today  EXAM: PELVIS - 1-2 VIEW  COMPARISON:  12/10/2008  FINDINGS: Diffuse osseous demineralization.  Right hip prosthesis new since previous exam.  A  wall foreign body protuberance is identified along the lateral margin of the proximal right femur, this not appear related to acute injury.  Displaced intertrochanteric fracture left femur with varus angulation.  Narrowing of left hip joint.  Visualized pelvis intact.  Rotated to the left.  Scattered vascular calcification.  IMPRESSION: Displaced angulated intertrochanteric fracture left femur.  Osseous demineralization with degenerative changes of the left hip joint.  Post right hip replacement.   Electronically Signed   By: Ulyses Southward M.D.   On: 02/08/2013 21:17   Dg Hip Operative Left  02/09/2013   CLINICAL DATA:  Intraoperative imaging, left femoral fracture  EXAM: OPERATIVE LEFT HIP  COMPARISON:  02/08/2013  FINDINGS: Two intraoperative fluoroscopic images demonstrate intraoperative dynamic nail fixation of the previously seen left femoral intertrochanteric fracture. No evidence for hardware failure. Fracture fragments are in near anatomic alignment.  IMPRESSION: Expected intraoperative appearance of left dynamic femoral fixation.   Electronically Signed   By: Christiana Pellant M.D.   On: 02/09/2013 10:06   Dg Femur Left  02/08/2013   CLINICAL DATA:  Larey Seat today, proximal left femoral pain and deformity  EXAM: LEFT FEMUR - 2 VIEW  COMPARISON:  None  FINDINGS: Displaced intertrochanteric fracture left femur with varus angulation.  No dislocation.  Narrowing of left hip joint.  Remainder of left femur appears intact with knee joint alignment normal.  Visualized left pelvis intact.  IMPRESSION: Displaced angulated intertrochanteric fracture left femur.  Osseous demineralization.  Degenerative changes left hip joint.   Electronically Signed   By: Ulyses Southward M.D.   On: 02/08/2013 21:02   Ct Head Wo Contrast  02/08/2013   CLINICAL DATA:  Fall  EXAM: CT HEAD WITHOUT CONTRAST  CT CERVICAL SPINE WITHOUT CONTRAST  TECHNIQUE: Multidetector CT imaging of the head and cervical spine was performed following the  standard protocol without intravenous contrast. Multiplanar CT image reconstructions of the cervical spine were also generated.  COMPARISON:  06/11/2010  FINDINGS: CT HEAD FINDINGS  Mild cortical volume loss noted with proportional ventricular prominence. Areas of periventricular white matter hypodensity are most compatible with small vessel ischemic change. Bilateral remote external capsule lacunar infarcts are reidentified. No midline shift. No skull fracture. Orbits and paranasal sinuses are intact.  CT CERVICAL SPINE FINDINGS  C1 through the cervicothoracic junction is visualized in its entirety. No precervical soft tissue widening. Inferior endplate compression deformity at C5 again noted, not significantly changed. C6-C7 disk degenerative change noted with minimal neural foraminal narrowing reidentified. No fracture suspect no acute fracture or dislocation. Mild motion artifact noted at the mid cervical spine.  IMPRESSION: No acute intracranial finding.  No acute cervical spine abnormality.   Electronically Signed   By: Christiana Pellant M.D.   On: 02/08/2013 20:09   Ct Cervical Spine Wo Contrast  02/08/2013   CLINICAL DATA:  Fall  EXAM: CT  HEAD WITHOUT CONTRAST  CT CERVICAL SPINE WITHOUT CONTRAST  TECHNIQUE: Multidetector CT imaging of the head and cervical spine was performed following the standard protocol without intravenous contrast. Multiplanar CT image reconstructions of the cervical spine were also generated.  COMPARISON:  06/11/2010  FINDINGS: CT HEAD FINDINGS  Mild cortical volume loss noted with proportional ventricular prominence. Areas of periventricular white matter hypodensity are most compatible with small vessel ischemic change. Bilateral remote external capsule lacunar infarcts are reidentified. No midline shift. No skull fracture. Orbits and paranasal sinuses are intact.  CT CERVICAL SPINE FINDINGS  C1 through the cervicothoracic junction is visualized in its entirety. No precervical soft  tissue widening. Inferior endplate compression deformity at C5 again noted, not significantly changed. C6-C7 disk degenerative change noted with minimal neural foraminal narrowing reidentified. No fracture suspect no acute fracture or dislocation. Mild motion artifact noted at the mid cervical spine.  IMPRESSION: No acute intracranial finding.  No acute cervical spine abnormality.   Electronically Signed   By: Christiana Pellant M.D.   On: 02/08/2013 20:09    Medications:  Prior to Admission:  Prescriptions prior to admission  Medication Sig Dispense Refill  . amLODipine (NORVASC) 10 MG tablet Take 1 tablet (10 mg total) by mouth daily.  30 tablet  5  . aspirin 81 MG chewable tablet Chew 1 tablet (81 mg total) by mouth daily.  30 tablet  5  . esomeprazole (NEXIUM) 40 MG capsule Take 40 mg by mouth daily before breakfast.      . ezetimibe (ZETIA) 10 MG tablet Take 10 mg by mouth at bedtime.      . furosemide (LASIX) 40 MG tablet Take 1 tablet (40 mg total) by mouth daily.  30 tablet  5  . HYDROcodone-acetaminophen (NORCO/VICODIN) 5-325 MG per tablet Take 1 tablet by mouth 2 (two) times daily as needed for pain.      . hydrocortisone cream (PREPARATION H HYDROCORTISONE) 1 % Apply 1 application topically 4 (four) times daily as needed (bleeding hemorrhoids).      . loratadine (CLARITIN) 10 MG tablet Take 1 tablet (10 mg total) by mouth daily.  30 tablet  5  . LORazepam (ATIVAN) 0.5 MG tablet Take 0.5 mg by mouth 2 (two) times daily as needed for anxiety.       . metoprolol succinate (TOPROL-XL) 100 MG 24 hr tablet Take 1 tablet (100 mg total) by mouth daily.  30 tablet  5  . Multiple Vitamin (MULTIVITAMIN WITH MINERALS) TABS tablet Take 1 tablet by mouth daily. Centrum Silver      . nitroGLYCERIN (NITROSTAT) 0.4 MG SL tablet Place 0.4 mg under the tongue every 5 (five) minutes as needed for chest pain.      . pravastatin (PRAVACHOL) 80 MG tablet Take 80 mg by mouth at bedtime.      . ramipril (ALTACE)  10 MG tablet Take 1 tablet (10 mg total) by mouth daily.  30 tablet  5  . rOPINIRole (REQUIP) 0.5 MG tablet Take 0.5 mg by mouth at bedtime.      . senna-docusate (SENOKOT-S) 8.6-50 MG per tablet Take 2 tablets by mouth at bedtime.  60 tablet  11  . sertraline (ZOLOFT) 50 MG tablet Take 50 mg by mouth daily.      Marland Kitchen terazosin (HYTRIN) 1 MG capsule Take 1 capsule (1 mg total) by mouth at bedtime.  30 capsule  5   Scheduled: . amLODipine  10 mg Oral Daily  . aspirin  81  mg Oral Daily  . ceFAZolin      . ceFAZolin      . enoxaparin (LOVENOX) injection  30 mg Subcutaneous Q24H  . loratadine  10 mg Oral Daily  . metoprolol succinate  100 mg Oral Daily  . pantoprazole  40 mg Oral Daily  . rOPINIRole  0.5 mg Oral QHS  . senna-docusate  2 tablet Oral QHS  . sertraline  50 mg Oral Daily  . terazosin  1 mg Oral QHS   Continuous:   Assessment/Plan: Mechanical fall with left hip fracture, patient is status post OR, continue postop management per orthopedics History of anemia of chronic disease Hypertension, currently hemodynamically stable. As patient is receiving low we will resume her diuretics, Previous note shows the patient was DO NOT RESUSCITATE, under hospice care History of CHF, coronary artery disease, hyperlipidemia, anxiety  LOS: 1 day   London Tarnowski D 02/09/2013, 12:08 PM

## 2013-02-09 NOTE — Anesthesia Postprocedure Evaluation (Signed)
Anesthesia Post Note  Patient: Darlene [name]  Procedure(s) Performed: Procedure(s) (LRB): INTRAMEDULLARY (IM) NAIL INTERTROCHANTRIC (Left)  Anesthesia type: general  Patient location: PACU  Post pain: Pain level controlled  Post assessment: Patient's Cardiovascular Status Stable  Last Vitals:  Filed Vitals:   02/09/13 1020  BP: 124/48  Pulse: 72  Temp: 36.8 C  Resp: 14    Post vital signs: Reviewed and stable  Level of consciousness: sedated  Complications: No apparent anesthesia complications

## 2013-02-09 NOTE — Evaluation (Signed)
Clinical/Bedside Swallow Evaluation Patient Details  Name: SERENITEE FUERTES MRN: [id number] Date of Birth: 1920-11-04  Today's Date: 02/09/2013 Time: 4696-2952 SLP Time Calculation (min): 20 min  Past Medical History:  Past Medical History  Diagnosis Date  . UTI (urinary tract infection)   . IBS (irritable bowel syndrome)   . Abdominal pain, left lower quadrant   . CAD (coronary artery disease)   . Pneumonia   . HTN (hypertension)   . Hyperlipidemia   . Diverticulosis of colon   . History of colon cancer   . Allergic rhinitis   . Angina   . Cancer   . Arthritis   . Shortness of breath   . Anemia   . Blood transfusion   . Anxiety   . CHF (congestive heart failure)   . GERD (gastroesophageal reflux disease)   . Peripheral vascular disease    Past Surgical History:  Past Surgical History  Procedure Laterality Date  . Hemicolectomy  1991    Right for mgt of colon cancer.  . Angioplasty  1989    Stent x 2  . Appendectomy    . Bilateral salpingoophorectomy    . Breast surgery      benign  . Orif hip fracture  12/2009    Dr Charlann Boxer  . Esophagogastroduodenoscopy  08/09/2011    Procedure: ESOPHAGOGASTRODUODENOSCOPY (EGD);  Surgeon: Hart Carwin, MD;  Location[facility] ENDOSCOPY;  Service: Endoscopy;  Laterality: N/A;  . Colonoscopy  10/24/2011    Procedure: COLONOSCOPY;  Surgeon: Beverley Fiedler, MD;  Location: [facility] ENDOSCOPY;  Service: Gastroenterology;  Laterality: N/A;  . Colon surgery     HPI:  Pt is 79 yo female who presented to [facility] ED after the fall at home and has subsequently suffered left hip fracture. Pt is somewhat poor historian but denies chest pain or shortness of breath, no abdominal or urinary concerns, no fevers, chills, no specific focal neurological symptoms. Pt reports pain in the left hip area, constant and 10/10 in severity, non radiating and worse with movement, no specific alleviating factors.    Assessment / Plan / Recommendation Clinical Impression  Pt did not  demonstrate any overt s/s of aspiration at bedside. Mildly prolonged oral phase with solid. Has hx of esophageal issues and reported that she gets choked if she eats too fast.  Given this, risk of aspiration is none-mild. Rx intermittent supervision to cue pt to eat and drink slowly; continue regular diet/ thin liquids. Speech will sign off at this time. Please re-consult if issues arise.    Aspiration Risk  Mild    Diet Recommendation Regular;Thin liquid   Liquid Administration via: Straw;Cup Medication Administration: Whole meds with liquid Supervision: Patient able to self feed Compensations: Slow rate;Follow solids with liquid Postural Changes and/or Swallow Maneuvers: Seated upright 90 degrees    Other  Recommendations Oral Care Recommendations: Oral care BID   Follow Up Recommendations       Frequency and Duration        Pertinent Vitals/Pain n/a    SLP Swallow Goals     Swallow Study Prior Functional Status       General HPI: Pt is 79 yo female who presented to [facility] ED after the fall at home and has subsequently suffered left hip fracture. Pt is somewhat poor historian but denies chest pain or shortness of breath, no abdominal or urinary concerns, no fevers, chills, no specific focal neurological symptoms. Pt reports pain in the left hip area, constant and  10/10 in severity, non radiating and worse with movement, no specific alleviating factors.  Type of Study: Bedside swallow evaluation Diet Prior to this Study: Regular;Thin liquids Temperature Spikes Noted: No Respiratory Status: Nasal cannula Behavior/Cognition: Alert;Cooperative;Pleasant mood Oral Cavity - Dentition: Adequate natural dentition Self-Feeding Abilities: Able to feed self Patient Positioning: Upright in bed Baseline Vocal Quality: Clear    Oral/Motor/Sensory Function Overall Oral Motor/Sensory Function: Appears within functional limits for tasks assessed Labial ROM: Within Functional Limits Labial  Symmetry: Within Functional Limits Labial Strength: Within Functional Limits Lingual ROM: Within Functional Limits Lingual Symmetry: Within Functional Limits Lingual Strength: Within Functional Limits   Ice Chips Ice chips: Not tested   Thin Liquid Thin Liquid: Within functional limits Presentation: Straw;Self Fed    Nectar Thick Nectar Thick Liquid: Not tested   Honey Thick Honey Thick Liquid: Not tested   Puree Puree: Not tested   Solid   GO    Solid: Within functional limits Presentation: Self Krystal Clark, Jarrius Huaracha K, MA, CCC-SLP 02/09/2013,3:12 PM

## 2013-02-09 NOTE — Progress Notes (Signed)
First unit PRBC's infusing started in or patient tolerating well. Will continue to monitor

## 2013-02-09 NOTE — Anesthesia Preprocedure Evaluation (Addendum)
Anesthesia Evaluation  Patient identified by MRN, date of birth, ID band Patient awake    Reviewed: Allergy & Precautions, H&P , NPO status , Patient's Chart, lab work & pertinent test results, reviewed documented beta blocker date and time   History of Anesthesia Complications Negative for: history of anesthetic complications  Airway Mallampati: II TM Distance: >3 FB Neck ROM: Full    Dental  (+) Poor Dentition and Dental Advisory Given   Pulmonary shortness of breath and with exertion,    Pulmonary exam normal       Cardiovascular hypertension, Pt. on medications and Pt. on home beta blockers + angina + CAD, + Peripheral Vascular Disease and +CHF  Echo 05/2012:  Left ventricle: The cavity size was mildly dilated.   Systolic function was moderately reduced. The estimated   ejection fraction was in the range of 35% to 40%. Diffuse   hypokinesis. Severe hypokinesis of the entirelateral and   inferolateral myocardium; consistent with infarction.   Aneurysmal deformity of the basalinferior myocardium.    Neuro/Psych Anxiety    GI/Hepatic Neg liver ROS, GERD-  Medicated,  Endo/Other  negative endocrine ROS  Renal/GU Renal InsufficiencyRenal disease     Musculoskeletal   Abdominal   Peds  Hematology   Anesthesia Other Findings   Reproductive/Obstetrics                        Anesthesia Physical Anesthesia Plan  ASA: III  Anesthesia Plan: General   Post-op Pain Management:    Induction: Intravenous  Airway Management Planned: LMA  Additional Equipment:   Intra-op Plan:   Post-operative Plan: Extubation in OR  Informed Consent: I have reviewed the patients History and Physical, chart, labs and discussed the procedure including the risks, benefits and alternatives for the proposed anesthesia with the patient or authorized representative who has indicated his/her understanding and acceptance.    Dental advisory given  Plan Discussed with: CRNA, Anesthesiologist and Surgeon  Anesthesia Plan Comments:         Anesthesia Quick Evaluation

## 2013-02-09 NOTE — Transfer of Care (Signed)
Immediate Anesthesia Transfer of Care Note  Patient: Darlene [name]  Procedure(s) Performed: Procedure(s): INTRAMEDULLARY (IM) NAIL INTERTROCHANTRIC (Left)  Patient Location: PACU  Anesthesia Type:General  Level of Consciousness: awake  Airway & Oxygen Therapy: Patient Spontanous Breathing and Patient connected to face mask oxygen  Post-op Assessment: Report given to PACU RN and Post -op Vital signs reviewed and stable  Post vital signs: Reviewed and stable  Complications: No apparent anesthesia complications

## 2013-02-09 NOTE — Consult Note (Signed)
Reason for Consult: Left intertrochanteric hip fracture Referring Physician: Dr. Augustina Mood is an 79 y.o. female.  HPI: Patient is a 79 year old woman who is a hospice patient. She lives with her son. She had a mechanical fall sustaining an intertrochanteric hip fracture on the left.  Past Medical History  Diagnosis Date  . UTI (urinary tract infection)   . IBS (irritable bowel syndrome)   . Abdominal pain, left lower quadrant   . CAD (coronary artery disease)   . Pneumonia   . HTN (hypertension)   . Hyperlipidemia   . Diverticulosis of colon   . History of colon cancer   . Allergic rhinitis   . Angina   . Cancer   . Arthritis   . Shortness of breath   . Anemia   . Blood transfusion   . Anxiety   . CHF (congestive heart failure)   . GERD (gastroesophageal reflux disease)   . Peripheral vascular disease     Past Surgical History  Procedure Laterality Date  . Hemicolectomy  1991    Right for mgt of colon cancer.  . Angioplasty  1989    Stent x 2  . Appendectomy    . Bilateral salpingoophorectomy    . Breast surgery      benign  . Orif hip fracture  12/2009    Dr Charlann Boxer  . Esophagogastroduodenoscopy  08/09/2011    Procedure: ESOPHAGOGASTRODUODENOSCOPY (EGD);  Surgeon: Hart Carwin, MD;  Location: [facility] ENDOSCOPY;  Service: Endoscopy;  Laterality: N/A;  . Colonoscopy  10/24/2011    Procedure: COLONOSCOPY;  Surgeon: Beverley Fiedler, MD;  Location: [facility] ENDOSCOPY;  Service: Gastroenterology;  Laterality: N/A;  . Colon surgery      Family History  Problem Relation Age of Onset  . Colon cancer Neg Hx   . Breast cancer Neg Hx     Social History:  reports that she has never smoked. She has never used smokeless tobacco. She reports that she does not drink alcohol or use illicit drugs.  Allergies:  Allergies  Allergen Reactions  . Antihistamines, Chlorpheniramine-Type Other (See Comments)    unknown  . Chocolate Other (See Comments)    Pt states "makes her sick"  and she starts coughing    Medications: I have reviewed the patient's current medications.  Results for orders placed during the hospital encounter of 02/08/13 (from the past 48 hour(s))  CBC WITH DIFFERENTIAL     Status: Abnormal   Collection Time    02/08/13  7:28 PM      Result Value Range   WBC 7.3  4.0 - 10.5 K/uL   RBC 3.24 (*) 3.87 - 5.11 MIL/uL   Hemoglobin 8.9 (*) 12.0 - 15.0 g/dL   HCT 65.7 (*) 84.6 - 96.2 %   MCV 84.6  78.0 - 100.0 fL   MCH 27.5  26.0 - 34.0 pg   MCHC 32.5  30.0 - 36.0 g/dL   RDW 95.2 (*) 84.1 - 32.4 %   Platelets 146 (*) 150 - 400 K/uL   Neutrophils Relative % 78 (*) 43 - 77 %   Neutro Abs 5.7  1.7 - 7.7 K/uL   Lymphocytes Relative 16  12 - 46 %   Lymphs Abs 1.1  0.7 - 4.0 K/uL   Monocytes Relative 6  3 - 12 %   Monocytes Absolute 0.4  0.1 - 1.0 K/uL   Eosinophils Relative 0  0 - 5 %   Eosinophils Absolute 0.0  0.0 - 0.7 K/uL   Basophils Relative 0  0 - 1 %   Basophils Absolute 0.0  0.0 - 0.1 K/uL  COMPREHENSIVE METABOLIC PANEL     Status: Abnormal   Collection Time    02/08/13  7:28 PM      Result Value Range   Sodium 135  135 - 145 mEq/L   Potassium 3.9  3.5 - 5.1 mEq/L   Chloride 98  96 - 112 mEq/L   CO2 27  19 - 32 mEq/L   Glucose, Bld 173 (*) 70 - 99 mg/dL   BUN 23  6 - 23 mg/dL   Creatinine, Ser 1.61 (*) 0.50 - 1.10 mg/dL   Calcium 8.8  8.4 - 09.6 mg/dL   Total Protein 6.7  6.0 - 8.3 g/dL   Albumin 3.5  3.5 - 5.2 g/dL   AST 20  0 - 37 U/L   ALT 9  0 - 35 U/L   Alkaline Phosphatase 76  39 - 117 U/L   Total Bilirubin 0.3  0.3 - 1.2 mg/dL   GFR calc non Af Amer 35 (*) >90 mL/min   GFR calc Af Amer 41 (*) >90 mL/min   Comment: (NOTE)     The eGFR has been calculated using the CKD EPI equation.     This calculation has not been validated in all clinical situations.     eGFR's persistently <90 mL/min signify possible Chronic Kidney     Disease.  PROTIME-INR     Status: None   Collection Time    02/08/13  7:28 PM      Result Value  Range   Prothrombin Time 12.9  11.6 - 15.2 seconds   INR 0.99  0.00 - 1.49  URINALYSIS, ROUTINE W REFLEX MICROSCOPIC     Status: Abnormal   Collection Time    02/08/13  9:58 PM      Result Value Range   Color, Urine YELLOW  YELLOW   APPearance CLEAR  CLEAR   Specific Gravity, Urine 1.010  1.005 - 1.030   pH 5.0  5.0 - 8.0   Glucose, UA NEGATIVE  NEGATIVE mg/dL   Hgb urine dipstick TRACE (*) NEGATIVE   Bilirubin Urine NEGATIVE  NEGATIVE   Ketones, ur NEGATIVE  NEGATIVE mg/dL   Protein, ur NEGATIVE  NEGATIVE mg/dL   Urobilinogen, UA 0.2  0.0 - 1.0 mg/dL   Nitrite NEGATIVE  NEGATIVE   Leukocytes, UA NEGATIVE  NEGATIVE  URINE MICROSCOPIC-ADD ON     Status: Abnormal   Collection Time    02/08/13  9:58 PM      Result Value Range   Squamous Epithelial / LPF RARE  RARE   RBC / HPF 0-2  <3 RBC/hpf   Bacteria, UA RARE  RARE   Casts HYALINE CASTS (*) NEGATIVE  CBC     Status: Abnormal   Collection Time    02/09/13  1:35 AM      Result Value Range   WBC 10.2  4.0 - 10.5 K/uL   RBC 3.07 (*) 3.87 - 5.11 MIL/uL   Hemoglobin 8.1 (*) 12.0 - 15.0 g/dL   HCT 04.5 (*) 40.9 - 81.1 %   MCV 84.0  78.0 - 100.0 fL   MCH 26.4  26.0 - 34.0 pg   MCHC 31.4  30.0 - 36.0 g/dL   RDW 91.4 (*) 78.2 - 95.6 %   Platelets 134 (*) 150 - 400 K/uL  BASIC METABOLIC PANEL  Status: Abnormal   Collection Time    02/09/13  1:35 AM      Result Value Range   Sodium 134 (*) 135 - 145 mEq/L   Potassium 4.4  3.5 - 5.1 mEq/L   Chloride 98  96 - 112 mEq/L   CO2 27  19 - 32 mEq/L   Glucose, Bld 146 (*) 70 - 99 mg/dL   BUN 21  6 - 23 mg/dL   Creatinine, Ser 8.11 (*) 0.50 - 1.10 mg/dL   Calcium 9.0  8.4 - 91.4 mg/dL   GFR calc non Af Amer 40 (*) >90 mL/min   GFR calc Af Amer 46 (*) >90 mL/min   Comment: (NOTE)     The eGFR has been calculated using the CKD EPI equation.     This calculation has not been validated in all clinical situations.     eGFR's persistently <90 mL/min signify possible Chronic Kidney      Disease.  SURGICAL PCR SCREEN     Status: None   Collection Time    02/09/13  2:48 AM      Result Value Range   MRSA, PCR NEGATIVE  NEGATIVE   Staphylococcus aureus NEGATIVE  NEGATIVE   Comment:            The Xpert SA Assay (FDA     approved for NASAL specimens     in patients over 63 years of age),     is one component of     a comprehensive surveillance     program.  Test performance has     been validated by [employer] for patients greater     than or equal to 65 year old.     It is not intended     to diagnose infection nor to     guide or monitor treatment.    Dg Chest 1 View  02/08/2013   CLINICAL DATA:  Proximal left femoral pain and deformity post fall today  EXAM: CHEST - 1 VIEW  COMPARISON:  01/13/2013  FINDINGS: Enlargement of cardiac silhouette.  Atherosclerotic calcification aorta.  Mediastinal contours and pulmonary vascularity normal.  Subsegmental atelectasis at right upper lobe and at right base.  No acute infiltrate, pleural effusion or pneumothorax.  Mild central peribronchial thickening.  Bones demineralized.  IMPRESSION: Enlargement of cardiac silhouette.  Bronchitic changes with scattered atelectasis.   Electronically Signed   By: Ulyses Southward M.D.   On: 02/08/2013 21:18   Dg Pelvis 1-2 Views  02/08/2013   CLINICAL DATA:  Left proximal femoral pain and deformity post fall today  EXAM: PELVIS - 1-2 VIEW  COMPARISON:  12/10/2008  FINDINGS: Diffuse osseous demineralization.  Right hip prosthesis new since previous exam.  A wall foreign body protuberance is identified along the lateral margin of the proximal right femur, this not appear related to acute injury.  Displaced intertrochanteric fracture left femur with varus angulation.  Narrowing of left hip joint.  Visualized pelvis intact.  Rotated to the left.  Scattered vascular calcification.  IMPRESSION: Displaced angulated intertrochanteric fracture left femur.  Osseous demineralization with degenerative changes  of the left hip joint.  Post right hip replacement.   Electronically Signed   By: Ulyses Southward M.D.   On: 02/08/2013 21:17   Dg Femur Left  02/08/2013   CLINICAL DATA:  Larey Seat today, proximal left femoral pain and deformity  EXAM: LEFT FEMUR - 2 VIEW  COMPARISON:  None  FINDINGS: Displaced  intertrochanteric fracture left femur with varus angulation.  No dislocation.  Narrowing of left hip joint.  Remainder of left femur appears intact with knee joint alignment normal.  Visualized left pelvis intact.  IMPRESSION: Displaced angulated intertrochanteric fracture left femur.  Osseous demineralization.  Degenerative changes left hip joint.   Electronically Signed   By: Ulyses Southward M.D.   On: 02/08/2013 21:02   Ct Head Wo Contrast  02/08/2013   CLINICAL DATA:  Fall  EXAM: CT HEAD WITHOUT CONTRAST  CT CERVICAL SPINE WITHOUT CONTRAST  TECHNIQUE: Multidetector CT imaging of the head and cervical spine was performed following the standard protocol without intravenous contrast. Multiplanar CT image reconstructions of the cervical spine were also generated.  COMPARISON:  06/11/2010  FINDINGS: CT HEAD FINDINGS  Mild cortical volume loss noted with proportional ventricular prominence. Areas of periventricular white matter hypodensity are most compatible with small vessel ischemic change. Bilateral remote external capsule lacunar infarcts are reidentified. No midline shift. No skull fracture. Orbits and paranasal sinuses are intact.  CT CERVICAL SPINE FINDINGS  C1 through the cervicothoracic junction is visualized in its entirety. No precervical soft tissue widening. Inferior endplate compression deformity at C5 again noted, not significantly changed. C6-C7 disk degenerative change noted with minimal neural foraminal narrowing reidentified. No fracture suspect no acute fracture or dislocation. Mild motion artifact noted at the mid cervical spine.  IMPRESSION: No acute intracranial finding.  No acute cervical spine abnormality.    Electronically Signed   By: Christiana Pellant M.D.   On: 02/08/2013 20:09   Ct Cervical Spine Wo Contrast  02/08/2013   CLINICAL DATA:  Fall  EXAM: CT HEAD WITHOUT CONTRAST  CT CERVICAL SPINE WITHOUT CONTRAST  TECHNIQUE: Multidetector CT imaging of the head and cervical spine was performed following the standard protocol without intravenous contrast. Multiplanar CT image reconstructions of the cervical spine were also generated.  COMPARISON:  06/11/2010  FINDINGS: CT HEAD FINDINGS  Mild cortical volume loss noted with proportional ventricular prominence. Areas of periventricular white matter hypodensity are most compatible with small vessel ischemic change. Bilateral remote external capsule lacunar infarcts are reidentified. No midline shift. No skull fracture. Orbits and paranasal sinuses are intact.  CT CERVICAL SPINE FINDINGS  C1 through the cervicothoracic junction is visualized in its entirety. No precervical soft tissue widening. Inferior endplate compression deformity at C5 again noted, not significantly changed. C6-C7 disk degenerative change noted with minimal neural foraminal narrowing reidentified. No fracture suspect no acute fracture or dislocation. Mild motion artifact noted at the mid cervical spine.  IMPRESSION: No acute intracranial finding.  No acute cervical spine abnormality.   Electronically Signed   By: Christiana Pellant M.D.   On: 02/08/2013 20:09    Review of Systems  All other systems reviewed and are negative.   Blood pressure 139/57, pulse 72, temperature 98.4 F (36.9 C), temperature source Oral, resp. rate 16, height 5\' 5"  (1.651 m), weight 51.71 kg (114 lb), SpO2 100.00%. Physical Exam On examination patient is a very frail cachectic woman. She has pain with attempted range of motion of the left hip. 2 the radiographs of the hip shows a comminuted left intertrochanteric hip fracture Assessment/Plan: Assessment comminuted left intertrochanteric hip fracture.  Plan:  Patient has increased pain and will require internal fixation for the fracture. Risks and benefits are discussed with the patient and her son including infection neurovascular injury DVT pulmonary embolus risk of mortality. Patient and her son state they understand and wish to proceed at this  time.  Ellon Marasco V 02/09/2013, 7:17 AM

## 2013-02-09 NOTE — Preoperative (Signed)
Beta Blockers   Reason not to administer Beta Blockers:Not Applicable 

## 2013-02-10 LAB — TYPE AND SCREEN
ABO/RH(D): A POS
Antibody Screen: NEGATIVE
Unit division: 0
Unit division: 0

## 2013-02-10 LAB — CBC
HCT: 27.5 % — ABNORMAL LOW (ref 36.0–46.0)
Hemoglobin: 9.2 g/dL — ABNORMAL LOW (ref 12.0–15.0)
MCH: 28.5 pg (ref 26.0–34.0)
MCHC: 33.5 g/dL (ref 30.0–36.0)
MCV: 85.1 fL (ref 78.0–100.0)
RDW: 17.6 % — ABNORMAL HIGH (ref 11.5–15.5)

## 2013-02-10 LAB — BASIC METABOLIC PANEL
BUN: 21 mg/dL (ref 6–23)
Calcium: 8.6 mg/dL (ref 8.4–10.5)
Creatinine, Ser: 1.08 mg/dL (ref 0.50–1.10)
GFR calc non Af Amer: 44 mL/min — ABNORMAL LOW (ref >90)
Glucose, Bld: 124 mg/dL — ABNORMAL HIGH (ref 70–99)
Sodium: 133 mEq/L — ABNORMAL LOW (ref 135–145)

## 2013-02-10 MED ORDER — METOPROLOL SUCCINATE ER 50 MG PO TB24
50.0000 mg | ORAL_TABLET | Freq: Every day | ORAL | Status: DC
  Administered 2013-02-10 – 2013-02-12 (×3): 50 mg via ORAL
  Filled 2013-02-10 (×3): qty 1

## 2013-02-10 MED ORDER — INFLUENZA VAC SPLIT QUAD 0.5 ML IM SUSP
0.5000 mL | INTRAMUSCULAR | Status: AC
  Administered 2013-02-11: 0.5 mL via INTRAMUSCULAR
  Filled 2013-02-10: qty 0.5

## 2013-02-10 NOTE — Evaluation (Signed)
Physical Therapy Evaluation Patient Details Name: Darlene [name] MRN: [id number] DOB: 07-Jan-1921 Today's Date: 02/10/2013 Time: 7829-5621 PT Time Calculation (min): 34 min  PT Assessment / Plan / Recommendation History of Present Illness  Pt s/p mechanical fall sustaining L hip fx. pt now s/p L IM nail  Clinical Impression  Pt was indep with cane PTA but now unable to tolerate L LE WBing to amb. Pt currently requires maxA for all transfers and ambulation with RW. Pt to strongly benefit from ST-SNF placement upon d/c however pt adamant about going home. Spoke with son, son feels comfortable taking pt home in this condition if hospice can provide services. Son also reports he can not take her home Monday 11/3 due to he doesn't have a car. Educated pt and son on benefits for ST-SNF stay to achieve therapy daily to progress to supervision level of function for safe transition home however pt adamant about returning home and son reports he can care for her. Pt with need HHPT however unsure if pt will be able to due to already receiving hospice services. Will speak with CSW.    PT Assessment  Patient needs continued PT services    Follow Up Recommendations  SNF;pt will need Home health PT;Supervision/Assistance - 24 hour if pt con't to defer SNF    Does the patient have the potential to tolerate intense rehabilitation      Barriers to Discharge Inaccessible home environment (4 steps to enter without handrail) pt's son unable to take patient home on monday 11/3.    Equipment Recommendations  Rolling walker with 5" wheels;Wheelchair (measurements PT);Wheelchair cushion (measurements PT)    Recommendations for Other Services     Frequency Min 5X/week    Precautions / Restrictions Precautions Precautions: Fall Restrictions Weight Bearing Restrictions: Yes LLE Weight Bearing: Weight bearing as tolerated   Pertinent Vitals/Pain Did not rate but reports L hip pain when in standing       Mobility  Bed Mobility Bed Mobility: Supine to Sit;Sitting - Scoot to Edge of Bed Supine to Sit: 2: Max assist Sitting - Scoot to [place] of Bed: 2: Max assist Details for Bed Mobility Assistance: assist for trunk elevation and LE management Transfers Transfers: Sit to Stand;Stand to Sit Sit to Stand: 2: Max assist;With upper extremity assist;From bed Stand to Sit: 2: Max assist;With upper extremity assist;To chair/3-in-1 Details for Transfer Assistance: max directional v/c's for hand placement. maxA to raise to standing due to limited L LE WBing Ambulation/Gait Ambulation/Gait Assistance: 2: Max assist Ambulation Distance (Feet):  (5 steps) Assistive device: Rolling walker Ambulation/Gait Assistance Details: minimal L LE WBing, maxA to maintain upright position during R LE advancement. maxA for walker management Gait Pattern: Step-to pattern;Narrow base of support;Ataxic Gait velocity: slow General Gait Details: significant falls risk Stairs: No    Exercises     PT Diagnosis: Difficulty walking;Acute pain  PT Problem List: Decreased strength;Decreased activity tolerance;Decreased balance;Decreased mobility PT Treatment Interventions: DME instruction;Gait training;Stair training;Functional mobility training;Therapeutic activities;Therapeutic exercise     PT Goals(Current goals can be found in the care plan section) Acute Rehab PT Goals Patient Stated Goal: pt adamently states "I'm going straight home. I'm not going no where else." PT Goal Formulation: With patient/family Time For Goal Achievement: 02/17/13 Potential to Achieve Goals: Good  Visit Information  Last PT Received On: 02/10/13 Assistance Needed: +1 History of Present Illness: Pt s/p mechanical fall sustaining L hip fx. pt now s/p L IM nail  Prior Functioning  Home Living Family/patient expects to be discharged to:: Private residence Living Arrangements: Children Available Help at Discharge: Available  24 hours/day;Family Type of Home: House Home Access: Stairs to enter [employer] of Steps: 4 uses wall and assistance of another. Entrance Stairs-Rails: None Home Layout: One level Home Equipment: Cane - single point;Bedside commode;Grab bars - toilet;Grab bars - tub/shower;Shower seat Prior Function Level of Independence: Independent with assistive device(s) Comments: pt reports she stand in shower. neither pt or son cooks. Pt reports they go out to eat or son brings something back. son does not have car Communication Communication: HOH (extremely) Dominant Hand: Right    Cognition  Cognition Arousal/Alertness: Awake/alert Behavior During Therapy: WFL for tasks assessed/performed Overall Cognitive Status: Within Functional Limits for tasks assessed (however determined to return home despite level of function)    Extremity/Trunk Assessment Upper Extremity Assessment Upper Extremity Assessment: Generalized weakness Lower Extremity Assessment Lower Extremity Assessment: Generalized weakness;LLE deficits/detail LLE Deficits / Details: able to complete LAQ, pain with hip mvmt, limited L LE WBing Cervical / Trunk Assessment Cervical / Trunk Assessment: Normal   Balance    End of Session PT - End of Session Equipment Utilized During Treatment: Gait belt Activity Tolerance: Patient limited by pain Patient left: in chair;with call bell/phone within reach Nurse Communication: Mobility status  GP     Marcene Brawn 02/10/2013, 11:47 AM  Lewis Shock, PT, DPT Pager #: [phone] Office #: [phone]

## 2013-02-10 NOTE — Progress Notes (Signed)
Subjective: Patient without any problems overnight. Patient tolerated transfusion well. No problems per nursing. Her blood pressure is low salt this morning however she's asymptomatic. Patient is pleasant, no apparent distress. Orthopedic note reviewed  Objective: Vital signs in last 24 hours: Temp:  [97.8 F (36.6 C)-100.8 F (38.2 C)] 98.1 F (36.7 C) (11/02 0630) Pulse Rate:  [68-88] 78 (11/02 0630) Resp:  [14-20] 18 (11/02 0630) BP: (93-132)/(31-80) 93/43 mmHg (11/02 0630) SpO2:  [95 %-100 %] 98 % (11/02 0630) Weight change:  Last BM Date: 02/08/13  Intake/Output from previous day: 11/01 0701 - 11/02 0700 In: 885 [P.O.:360; I.V.:200; Blood:325] Out: 1425 [Urine:1350; Blood:75] Intake/Output this shift:    General appearance: alert and cooperative Resp: clear to auscultation bilaterally Cardio: regular rate and rhythm, S1, S2 normal, no murmur, click, rub or gallop Extremities: No edema neurovascularly intact  Lab Results:  Results for orders placed during the hospital encounter of 02/08/13 (from the past 24 hour(s))  PREPARE RBC (CROSSMATCH)     Status: None   Collection Time    02/09/13  8:42 AM      Result Value Range   Order Confirmation ORDER PROCESSED BY BLOOD BANK    CBC     Status: Abnormal   Collection Time    02/10/13  5:34 AM      Result Value Range   WBC 6.9  4.0 - 10.5 K/uL   RBC 3.23 (*) 3.87 - 5.11 MIL/uL   Hemoglobin 9.2 (*) 12.0 - 15.0 g/dL   HCT 16.1 (*) 09.6 - 04.5 %   MCV 85.1  78.0 - 100.0 fL   MCH 28.5  26.0 - 34.0 pg   MCHC 33.5  30.0 - 36.0 g/dL   RDW 40.9 (*) 81.1 - 91.4 %   Platelets 97 (*) 150 - 400 K/uL  BASIC METABOLIC PANEL     Status: Abnormal   Collection Time    02/10/13  5:34 AM      Result Value Range   Sodium 133 (*) 135 - 145 mEq/L   Potassium 4.1  3.5 - 5.1 mEq/L   Chloride 97  96 - 112 mEq/L   CO2 28  19 - 32 mEq/L   Glucose, Bld 124 (*) 70 - 99 mg/dL   BUN 21  6 - 23 mg/dL   Creatinine, Ser 7.82  0.50 - 1.10 mg/dL    Calcium 8.6  8.4 - 95.6 mg/dL   GFR calc non Af Amer 44 (*) >90 mL/min   GFR calc Af Amer 51 (*) >90 mL/min      Studies/Results: Dg Chest 1 View  02/08/2013   CLINICAL DATA:  Proximal left femoral pain and deformity post fall today  EXAM: CHEST - 1 VIEW  COMPARISON:  01/13/2013  FINDINGS: Enlargement of cardiac silhouette.  Atherosclerotic calcification aorta.  Mediastinal contours and pulmonary vascularity normal.  Subsegmental atelectasis at right upper lobe and at right base.  No acute infiltrate, pleural effusion or pneumothorax.  Mild central peribronchial thickening.  Bones demineralized.  IMPRESSION: Enlargement of cardiac silhouette.  Bronchitic changes with scattered atelectasis.   Electronically Signed   By: Ulyses Southward M.D.   On: 02/08/2013 21:18   Dg Pelvis 1-2 Views  02/08/2013   CLINICAL DATA:  Left proximal femoral pain and deformity post fall today  EXAM: PELVIS - 1-2 VIEW  COMPARISON:  12/10/2008  FINDINGS: Diffuse osseous demineralization.  Right hip prosthesis new since previous exam.  A wall foreign body protuberance is identified along  the lateral margin of the proximal right femur, this not appear related to acute injury.  Displaced intertrochanteric fracture left femur with varus angulation.  Narrowing of left hip joint.  Visualized pelvis intact.  Rotated to the left.  Scattered vascular calcification.  IMPRESSION: Displaced angulated intertrochanteric fracture left femur.  Osseous demineralization with degenerative changes of the left hip joint.  Post right hip replacement.   Electronically Signed   By: Ulyses Southward M.D.   On: 02/08/2013 21:17   Dg Hip Operative Left  02/09/2013   CLINICAL DATA:  Intraoperative imaging, left femoral fracture  EXAM: OPERATIVE LEFT HIP  COMPARISON:  02/08/2013  FINDINGS: Two intraoperative fluoroscopic images demonstrate intraoperative dynamic nail fixation of the previously seen left femoral intertrochanteric fracture. No evidence for  hardware failure. Fracture fragments are in near anatomic alignment.  IMPRESSION: Expected intraoperative appearance of left dynamic femoral fixation.   Electronically Signed   By: Christiana Pellant M.D.   On: 02/09/2013 10:06   Dg Femur Left  02/08/2013   CLINICAL DATA:  Larey Seat today, proximal left femoral pain and deformity  EXAM: LEFT FEMUR - 2 VIEW  COMPARISON:  None  FINDINGS: Displaced intertrochanteric fracture left femur with varus angulation.  No dislocation.  Narrowing of left hip joint.  Remainder of left femur appears intact with knee joint alignment normal.  Visualized left pelvis intact.  IMPRESSION: Displaced angulated intertrochanteric fracture left femur.  Osseous demineralization.  Degenerative changes left hip joint.   Electronically Signed   By: Ulyses Southward M.D.   On: 02/08/2013 21:02   Ct Head Wo Contrast  02/08/2013   CLINICAL DATA:  Fall  EXAM: CT HEAD WITHOUT CONTRAST  CT CERVICAL SPINE WITHOUT CONTRAST  TECHNIQUE: Multidetector CT imaging of the head and cervical spine was performed following the standard protocol without intravenous contrast. Multiplanar CT image reconstructions of the cervical spine were also generated.  COMPARISON:  06/11/2010  FINDINGS: CT HEAD FINDINGS  Mild cortical volume loss noted with proportional ventricular prominence. Areas of periventricular white matter hypodensity are most compatible with small vessel ischemic change. Bilateral remote external capsule lacunar infarcts are reidentified. No midline shift. No skull fracture. Orbits and paranasal sinuses are intact.  CT CERVICAL SPINE FINDINGS  C1 through the cervicothoracic junction is visualized in its entirety. No precervical soft tissue widening. Inferior endplate compression deformity at C5 again noted, not significantly changed. C6-C7 disk degenerative change noted with minimal neural foraminal narrowing reidentified. No fracture suspect no acute fracture or dislocation. Mild motion artifact noted at  the mid cervical spine.  IMPRESSION: No acute intracranial finding.  No acute cervical spine abnormality.   Electronically Signed   By: Christiana Pellant M.D.   On: 02/08/2013 20:09   Ct Cervical Spine Wo Contrast  02/08/2013   CLINICAL DATA:  Fall  EXAM: CT HEAD WITHOUT CONTRAST  CT CERVICAL SPINE WITHOUT CONTRAST  TECHNIQUE: Multidetector CT imaging of the head and cervical spine was performed following the standard protocol without intravenous contrast. Multiplanar CT image reconstructions of the cervical spine were also generated.  COMPARISON:  06/11/2010  FINDINGS: CT HEAD FINDINGS  Mild cortical volume loss noted with proportional ventricular prominence. Areas of periventricular white matter hypodensity are most compatible with small vessel ischemic change. Bilateral remote external capsule lacunar infarcts are reidentified. No midline shift. No skull fracture. Orbits and paranasal sinuses are intact.  CT CERVICAL SPINE FINDINGS  C1 through the cervicothoracic junction is visualized in its entirety. No precervical soft tissue widening. Inferior  endplate compression deformity at C5 again noted, not significantly changed. C6-C7 disk degenerative change noted with minimal neural foraminal narrowing reidentified. No fracture suspect no acute fracture or dislocation. Mild motion artifact noted at the mid cervical spine.  IMPRESSION: No acute intracranial finding.  No acute cervical spine abnormality.   Electronically Signed   By: Christiana Pellant M.D.   On: 02/08/2013 20:09    Medications:  Prior to Admission:  Prescriptions prior to admission  Medication Sig Dispense Refill  . amLODipine (NORVASC) 10 MG tablet Take 1 tablet (10 mg total) by mouth daily.  30 tablet  5  . aspirin 81 MG chewable tablet Chew 1 tablet (81 mg total) by mouth daily.  30 tablet  5  . esomeprazole (NEXIUM) 40 MG capsule Take 40 mg by mouth daily before breakfast.      . ezetimibe (ZETIA) 10 MG tablet Take 10 mg by mouth at  bedtime.      . furosemide (LASIX) 40 MG tablet Take 1 tablet (40 mg total) by mouth daily.  30 tablet  5  . HYDROcodone-acetaminophen (NORCO/VICODIN) 5-325 MG per tablet Take 1 tablet by mouth 2 (two) times daily as needed for pain.      . hydrocortisone cream (PREPARATION H HYDROCORTISONE) 1 % Apply 1 application topically 4 (four) times daily as needed (bleeding hemorrhoids).      . loratadine (CLARITIN) 10 MG tablet Take 1 tablet (10 mg total) by mouth daily.  30 tablet  5  . LORazepam (ATIVAN) 0.5 MG tablet Take 0.5 mg by mouth 2 (two) times daily as needed for anxiety.       . metoprolol succinate (TOPROL-XL) 100 MG 24 hr tablet Take 1 tablet (100 mg total) by mouth daily.  30 tablet  5  . Multiple Vitamin (MULTIVITAMIN WITH MINERALS) TABS tablet Take 1 tablet by mouth daily. Centrum Silver      . nitroGLYCERIN (NITROSTAT) 0.4 MG SL tablet Place 0.4 mg under the tongue every 5 (five) minutes as needed for chest pain.      . pravastatin (PRAVACHOL) 80 MG tablet Take 80 mg by mouth at bedtime.      . ramipril (ALTACE) 10 MG tablet Take 1 tablet (10 mg total) by mouth daily.  30 tablet  5  . rOPINIRole (REQUIP) 0.5 MG tablet Take 0.5 mg by mouth at bedtime.      . senna-docusate (SENOKOT-S) 8.6-50 MG per tablet Take 2 tablets by mouth at bedtime.  60 tablet  11  . sertraline (ZOLOFT) 50 MG tablet Take 50 mg by mouth daily.      Marland Kitchen terazosin (HYTRIN) 1 MG capsule Take 1 capsule (1 mg total) by mouth at bedtime.  30 capsule  5   Scheduled: . amLODipine  10 mg Oral Daily  . aspirin  81 mg Oral Daily  . aspirin EC  325 mg Oral Q breakfast  . chlorhexidine  60 mL Topical Once  . enoxaparin (LOVENOX) injection  30 mg Subcutaneous Q24H  . feeding supplement (RESOURCE BREEZE)  1 Container Oral BID BM  . loratadine  10 mg Oral Daily  . metoprolol succinate  100 mg Oral Daily  . pantoprazole  40 mg Oral Daily  . rOPINIRole  0.5 mg Oral QHS  . senna-docusate  2 tablet Oral QHS  . sertraline  50 mg  Oral Daily  . terazosin  1 mg Oral QHS   Continuous: . sodium chloride 20 mL/hr at 02/09/13 2300    Assessment/Plan:  Mechanical fall with left hip fracture, patient is status post OR, continue postop management per orthopedics  History of anemia of chronic disease , patient transfuse one unit packed red blood cell, H&H of 9.2. Hypertension, BP lower lobe this morning however she's asymptomatic. Meds will be adjusted appropriately Previous note shows the patient was DO NOT RESUSCITATE, under hospice care  History of CHF, coronary artery disease, hyperlipidemia, anxiety   LOS: 2 days   Arber Wiemers D 02/10/2013, 8:28 AM

## 2013-02-10 NOTE — Progress Notes (Signed)
Patient ID: Darlene [name], female   DOB: [birth year], 79 y.o.   MRN: [id number] Postoperative day 1 left hip internal fixation for subtrochanteric hip fracture. Patient was comfortable this morning plan for physical therapy progressive ambulation weightbearing as tolerated. Patient will require discharge to skilled nursing facility.

## 2013-02-11 DIAGNOSIS — Z515 Encounter for palliative care: Secondary | ICD-10-CM

## 2013-02-11 DIAGNOSIS — Z66 Do not resuscitate: Secondary | ICD-10-CM

## 2013-02-11 DIAGNOSIS — S72143A Displaced intertrochanteric fracture of unspecified femur, initial encounter for closed fracture: Principal | ICD-10-CM

## 2013-02-11 DIAGNOSIS — I10 Essential (primary) hypertension: Secondary | ICD-10-CM

## 2013-02-11 DIAGNOSIS — D649 Anemia, unspecified: Secondary | ICD-10-CM

## 2013-02-11 LAB — BASIC METABOLIC PANEL
BUN: 25 mg/dL — ABNORMAL HIGH (ref 6–23)
CO2: 27 mEq/L (ref 19–32)
Calcium: 8.5 mg/dL (ref 8.4–10.5)
Chloride: 99 mEq/L (ref 96–112)
Creatinine, Ser: 1.29 mg/dL — ABNORMAL HIGH (ref 0.50–1.10)
Glucose, Bld: 110 mg/dL — ABNORMAL HIGH (ref 70–99)

## 2013-02-11 LAB — CBC
HCT: 24.3 % — ABNORMAL LOW (ref 36.0–46.0)
Hemoglobin: 8.2 g/dL — ABNORMAL LOW (ref 12.0–15.0)
MCV: 85.3 fL (ref 78.0–100.0)
Platelets: 92 10*3/uL — ABNORMAL LOW (ref 150–400)
RBC: 2.85 MIL/uL — ABNORMAL LOW (ref 3.87–5.11)
WBC: 6.2 10*3/uL (ref 4.0–10.5)

## 2013-02-11 NOTE — Progress Notes (Signed)
Subjective: Darlene [name] was admitted with intertrochanteric fracture left hip - s/p ORIF with nail. PT eval reviewed - no surprise that patient insists on going home. Home Health PT should not be a conflict with hospice status.  Patient is awake and alert but very HOH. She is not in pain. She is anxious to go home  Objective: Lab:  Recent Labs  02/08/13 1928 02/09/13 0135 02/10/13 0534 02/11/13 0405  WBC 7.3 10.2 6.9 6.2  NEUTROABS 5.7  --   --   --   HGB 8.9* 8.1* 9.2* 8.2*  HCT 27.4* 25.8* 27.5* 24.3*  MCV 84.6 84.0 85.1 85.3  PLT 146* 134* 97* 92*    Recent Labs  02/09/13 0135 02/10/13 0534 02/11/13 0405  NA 134* 133* 134*  K 4.4 4.1 3.6  CL 98 97 99  GLUCOSE 146* 124* 110*  BUN 21 21 25*  CREATININE 1.17* 1.08 1.29*  CALCIUM 9.0 8.6 8.5    Imaging:  Scheduled Meds: . amLODipine  10 mg Oral Daily  . aspirin  81 mg Oral Daily  . aspirin EC  325 mg Oral Q breakfast  . enoxaparin (LOVENOX) injection  30 mg Subcutaneous Q24H  . feeding supplement (RESOURCE BREEZE)  1 Container Oral BID BM  . influenza vac split quadrivalent PF  0.5 mL Intramuscular Tomorrow-1000  . loratadine  10 mg Oral Daily  . metoprolol succinate  50 mg Oral Daily  . pantoprazole  40 mg Oral Daily  . rOPINIRole  0.5 mg Oral QHS  . senna-docusate  2 tablet Oral QHS  . sertraline  50 mg Oral Daily  . terazosin  1 mg Oral QHS   Continuous Infusions: . sodium chloride 20 mL/hr at 02/09/13 2300   PRN Meds:.acetaminophen, acetaminophen, HYDROcodone-acetaminophen, LORazepam, menthol-cetylpyridinium, metoCLOPramide (REGLAN) injection, metoCLOPramide, morphine injection, morphine injection, ondansetron (ZOFRAN) IV, ondansetron, phenol   Physical Exam: Filed Vitals:   02/11/13 0500  BP: 134/90  Pulse: 76  Temp: 98.2 F (36.8 C)  Resp: 18   Gen'l - very elderly woman in no distress HEENT- persistent lesion left lower eye lid. Cor- 2+ radial, RRR, II/VI mm best at RSB Pulm - normal  respirations, no rales or wheezes, no increased WOB  Abd- BS+ positive Ext - wound site left hip not examined - defer to ortho Neuro - at her baseline - reasonable, stubborn and HOH     Assessment/Plan: 1. Ortho - POD # 2. PT notes reviewed. Patient still cannot bear weight. Insists on going home. Plan [facility] PT/OT ordered  Nursing services per [facility]  2. Anemia - chronic anemia secondary to chronic disease and AVMs - currently stable  3. Diastolic heart failure - had transfusion 1 unit PRBCs - did well w/o fluid overload.  Dispo - plan for d/c home tomorrow. Will need ambulance transport - she or son have no car. Will need home PT, BSC if not already present. Will continue as [facility] patient    Doroteo Glassman IM (o) [phone]; (c) [phone] Call-grp - Patsi Sears IM  Tele: [phone]  02/11/2013, 7:30 AM

## 2013-02-11 NOTE — Progress Notes (Signed)
Room 5N 02 - Darlene [name] - [facility] RN Visit-R.Allessandra Bernardi RN  Related admission to [facility] diagnosis of CHF.   Pt is DNR code with OOF DNR on shadow chart.   Pt alert & oriented, sitting up in lounge chair, without complaints of pain or discomfort unless she weight bears on LLE.    No family present. Patient's home medication list is on shadow chart.   Pt states she was hanging a pair of pants in the closet and thinks she stepped on the leg of the pants and fell.  Pt has a L/intertrochanteric femur fracture and ORIF was completed on 11/1.   Son did not contact [facility] and advise that pt had the fall/injury and was admitted on Friday 10/31 until Sunday 11/2.    Per CM RN, pt's attending ordered light weight wheel chair which pt does not have in home - [facility] will order and have delivered to home.  Other orders were for rolling walker and BSC which pt does have in home.    Attending ordered: Order for home health PT/OT - will relay to [facility] RN Mgr.  Please call [facility] @ [phone]- ask for RN Liaison or after hours,ask for on-call RN with any hospice needs.   Thank you.  [name], RN  [facility]  Hospice Liaison  [phone])

## 2013-02-11 NOTE — Care Management Utilization Note (Signed)
Utilization review completed. Jayvier Burgher, RN BSN 

## 2013-02-11 NOTE — Progress Notes (Signed)
02/11/13 Spoke with Chalmers Cater RN with [facility], patient will be returning home with her son and [facility] care. [facility] will have PT/OT see patient. Ms Lipsky has a rolling walker and a 3N1, [facility] will get her a lightweight wheelchair. Plan is for d/c 02/12/13 via ambulance.I will continue to follow for any other d/c needs. Jacquelynn Cree RN, BSN, CCM

## 2013-02-11 NOTE — Evaluation (Signed)
Occupational Therapy Evaluation Patient Details Name: Darlene [name] MRN: [id number] DOB: 1920-07-26 Today's Date: 02/11/2013 Time: 4098-1191 OT Time Calculation (min): 26 min  OT Assessment / Plan / Recommendation History of present illness Pt s/p mechanical fall sustaining L hip fx. pt now s/p L IM nail   Clinical Impression   This 79 yo female s/p above presents to acute OT with deficits below, is under Hospice Care, is legally blind, and HOH all affecting her PLOF at an Independent level in her own environment for BADLs. Will benefit from acute OT with follow up HHOT.    OT Assessment  Patient needs continued OT Services    Follow Up Recommendations  Home health OT [facility])       Equipment Recommendations  3 in 1 bedside comode       Frequency  Min 2X/week    Precautions / Restrictions Precautions Precautions: Fall Restrictions LLE Weight Bearing: Weight bearing as tolerated       ADL  Eating/Feeding: Set up Where Assessed - Eating/Feeding: Chair Grooming: Supervision/safety;Set up Where Assessed - Grooming: Supported sitting Upper Body Bathing: Set up;Supervision/safety Where Assessed - Upper Body Bathing: Supported sitting Lower Body Bathing: Maximal assistance Where Assessed - Lower Body Bathing: Supported sit to stand Upper Body Dressing: Minimal assistance Where Assessed - Upper Body Dressing: Supported sitting Lower Body Dressing: Maximal assistance Where Assessed - Lower Body Dressing: Supported sit to Pharmacist, hospital: Minimal Dentist Method: Sit to Barista: Comfort height toilet;Grab bars Toileting - Architect and Hygiene: Minimal assistance Where Assessed - Engineer, mining and Hygiene: Sit to stand from 3-in-1 or toilet Equipment Used: Gait belt;Rolling walker Transfers/Ambulation Related to ADLs: Min A for all with RW    OT Diagnosis: Generalized weakness;Acute pain  OT  Problem List: Decreased strength;Decreased range of motion;Decreased activity tolerance;Impaired balance (sitting and/or standing);Pain OT Treatment Interventions: Self-care/ADL training;Balance training;DME and/or AE instruction;Patient/family education   OT Goals(Current goals can be found in the care plan section) Acute Rehab OT Goals Patient Stated Goal: To go home OT Goal Formulation: With patient Time For Goal Achievement: 02/18/13 Potential to Achieve Goals: Good  Visit Information  Last OT Received On: 02/11/13 Assistance Needed: +1 History of Present Illness: Pt s/p mechanical fall sustaining L hip fx. pt now s/p L IM nail       Prior Functioning     Home Living Family/patient expects to be discharged to:: Private residence Living Arrangements: Children Available Help at Discharge: Family;Available 24 hours/day Type of Home: House Home Access: Stairs to enter [employer] of Steps: 4 uses wall and assistance of another. Entrance Stairs-Rails: None Home Layout: One level Home Equipment: None (per son) Prior Function Level of Independence: Independent Comments: pt reports she stand in shower. neither pt or son cooks. Pt reports they go out to eat or son brings something back. son does not have car Communication Communication: HOH Dominant Hand: Right         Vision/Perception Vision - History Baseline Vision: Legally blind Patient Visual Report: No change from baseline      Extremity/Trunk Assessment Upper Extremity Assessment Upper Extremity Assessment: Overall WFL for tasks assessed     Mobility Bed Mobility Bed Mobility: Sit to Supine Sit to Supine: 4: Min guard;HOB flat Transfers Transfers: Sit to Stand;Stand to Sit Sit to Stand: 4: Min assist;With upper extremity assist;With armrests;From chair/3-in-1 Stand to Sit: 4: Min assist;With upper extremity assist;To bed Details for Transfer Assistance: VCs for  safe hand placement            End of Session OT - End of Session Equipment Utilized During Treatment: Gait belt;Rolling walker Activity Tolerance: Patient tolerated treatment well Patient left: in chair;with call bell/phone within reach       Evette Georges [phone] 02/11/2013, 2:39 PM

## 2013-02-11 NOTE — Progress Notes (Signed)
Physical Therapy Treatment Patient Details Name: Darlene [name] MRN: [id number] DOB: 30-Sep-1920 Today's Date: 02/11/2013 Time: 0800-0827 PT Time Calculation (min): 27 min  PT Assessment / Plan / Recommendation  History of Present Illness Pt s/p mechanical fall sustaining L hip fx. pt now s/p L IM nail   PT Comments   Pt cont's to be limited by pain but was able to make progress with mobility today.  Ambulated ~10' with RW + mod (A) for balance & safety.  Cont to strongly feel pt would best benefit from ST-SNF but pt still adamant about going home.     Follow Up Recommendations  SNF;Home health PT;Supervision/Assistance - 24 hour     Does the patient have the potential to tolerate intense rehabilitation     Barriers to Discharge        Equipment Recommendations  Rolling walker with 5" wheels;Wheelchair (measurements PT);Wheelchair cushion (measurements PT)    Recommendations for Other Services    Frequency Min 5X/week   Progress towards PT Goals Progress towards PT goals: Progressing toward goals  Plan Current plan remains appropriate    Precautions / Restrictions Precautions Precautions: Fall Restrictions LLE Weight Bearing: Weight bearing as tolerated   Pertinent Vitals/Pain "It's full force" when asking to rate pain.      Mobility  Bed Mobility Bed Mobility: Rolling Right;Supine to Sit;Sitting - Scoot to Edge of Bed Rolling Right: 4: Min guard;With rail Supine to Sit: 3: Mod assist;HOB flat;With rails Sitting - Scoot to Edge of Bed: 4: Min assist Details for Bed Mobility Assistance: Cues for technique.  Very HOH which required repeatitiion of cues.  (A) to move LLE & to bring shoulders/trunk to sitting upright.  Pt did well with using UE's to bring hips closer to EOB.   Transfers Transfers: Sit to Stand;Stand to Sit Sit to Stand: 2: Max assist;With upper extremity assist;From bed Stand to Sit: With upper extremity assist;With armrests;4: Min assist;To  chair/3-in-1 Details for Transfer Assistance: Cues for hand placement & technique.  (A) to achieve standing, balance, & controlled descent.   Ambulation/Gait Ambulation/Gait Assistance: 3: Mod assist Ambulation Distance (Feet): 10 Feet Assistive device: Rolling walker Ambulation/Gait Assistance Details: Multi-modal cues for sequencing & body positioning inside RW.  (A) for balance & advancement of RW.  Pt tends to stay towards Lt side of walker & with very narrow BOS.   Gait Pattern: Step-to pattern;Decreased step length - right;Decreased step length - left;Decreased weight shift to left Gait velocity: slow General Gait Details: significant falls risk Stairs: No      PT Goals (current goals can now be found in the care plan section) Acute Rehab PT Goals PT Goal Formulation: With patient/family Time For Goal Achievement: 02/17/13 Potential to Achieve Goals: Good  Visit Information  Last PT Received On: 02/11/13 Assistance Needed: +1 History of Present Illness: Pt s/p mechanical fall sustaining L hip fx. pt now s/p L IM nail    Subjective Data  Subjective: "will you be the one coming out to the house?"   Cognition  Cognition Arousal/Alertness: Awake/alert Behavior During Therapy: [facility] for tasks assessed/performed Overall Cognitive Status: Within Functional Limits for tasks assessed    Balance     End of Session PT - End of Session Equipment Utilized During Treatment: Gait belt Activity Tolerance: Patient tolerated treatment well;Patient limited by pain Patient left: in chair;with call bell/phone within reach;with nursing/sitter in room Nurse Communication: Mobility status   GP     Lara Mulch 02/11/2013, 8:38  AM  Verdell Face, PTA [phone] 02/11/2013

## 2013-02-11 NOTE — Progress Notes (Signed)
[facility] Social Work note Patient is currently receiving services from hospice, yet family did not notify of fall, admission to hospital until 11/2. LCSW met with patient this am as she sat up in the chair. She was alert, oriented and recounted events of the fall. She reports that she had surgery and is ready to go home tomorrow. She voiced that she sees no problems at home with her care and the only issue being need for [facility] assistance.  Patient voiced no pain, concerns at present. Support offered and ongoing f/u at home. Melanie A. Temple Pacini  [phone]

## 2013-02-12 ENCOUNTER — Encounter (HOSPITAL_COMMUNITY): Payer: Self-pay | Admitting: Orthopedic Surgery

## 2013-02-12 DIAGNOSIS — C44111 Basal cell carcinoma of skin of unspecified eyelid, including canthus: Secondary | ICD-10-CM

## 2013-02-12 MED ORDER — ASPIRIN 325 MG PO TBEC: 325.0000 mg | DELAYED_RELEASE_TABLET | Freq: Every day | ORAL | Status: DC

## 2013-02-12 MED ORDER — BOOST / RESOURCE BREEZE PO LIQD: 1.0000 | Freq: Two times a day (BID) | ORAL | Status: AC

## 2013-02-12 NOTE — Progress Notes (Signed)
Patient ID: Darlene [name], female   DOB: April 22, 1920, 79 y.o.   MRN: [id number] Anticipate discharge to home today with hospice care. Orders written for aspirin for DVT prophylaxis to be used for 1 month and weightbearing as tolerated on the left lower extremity. Patient may shower and get incision wet with dry dressing change as needed.

## 2013-02-12 NOTE — Progress Notes (Signed)
Subjective: Reviewed notes from hospice and care manager. All is in place for d/c to home w/ continue hopsice care, PT/OT and DME.  Objective: Lab:  Recent Labs  02/10/13 0534 02/11/13 0405  WBC 6.9 6.2  HGB 9.2* 8.2*  HCT 27.5* 24.3*  MCV 85.1 85.3  PLT 97* 92*    Recent Labs  02/10/13 0534 02/11/13 0405  NA 133* 134*  K 4.1 3.6  CL 97 99  GLUCOSE 124* 110*  BUN 21 25*  CREATININE 1.08 1.29*  CALCIUM 8.6 8.5    Imaging:  Scheduled Meds: . amLODipine  10 mg Oral Daily  . aspirin  81 mg Oral Daily  . aspirin EC  325 mg Oral Q breakfast  . enoxaparin (LOVENOX) injection  30 mg Subcutaneous Q24H  . feeding supplement (RESOURCE BREEZE)  1 Container Oral BID BM  . loratadine  10 mg Oral Daily  . metoprolol succinate  50 mg Oral Daily  . pantoprazole  40 mg Oral Daily  . rOPINIRole  0.5 mg Oral QHS  . senna-docusate  2 tablet Oral QHS  . sertraline  50 mg Oral Daily  . terazosin  1 mg Oral QHS   Continuous Infusions: . sodium chloride 20 mL/hr at 02/09/13 2300   PRN Meds:.acetaminophen, acetaminophen, HYDROcodone-acetaminophen, LORazepam, menthol-cetylpyridinium, ondansetron (ZOFRAN) IV, ondansetron, phenol   Physical Exam: Filed Vitals:   02/12/13 0546  BP: 136/62  Pulse: 84  Temp: 98.7 F (37.1 C)  Resp: 16  see d/c summary      Assessment/Plan: For d/c home with hospice care, PT, OT. Priority dictation [phone]   [name] ([phone]; (c) [phone] Call-grp - [facility]  Tele: [phone]  02/12/2013, 7:41 AM

## 2013-02-12 NOTE — Progress Notes (Signed)
Occupational Therapy Treatment Patient Details Name: Darlene [name] MRN: [id number] DOB: April 18, 1920 Today's Date: 02/12/2013 Time: 4098-1191 OT Time Calculation (min): 32 min  OT Assessment / Plan / Recommendation  History of present illness Pt s/p mechanical fall sustaining L hip fx. pt now s/p L IM nail   OT comments  Pt progressing with therapy. She was initially at min guard level at beginning of tx session but once on her feet w/RW she was only requiring close supervision during amb w/rw and standing at sink for bathing & grooming tasks. Pt continues to insist that son can provide 24/7 assist. RW, 3n1 and w/c ordered and to be delivered to pt's home. Pt is able to reach to L ankle during simulated LB ADLs. AE would not be appropriate due to pt's decreased vision. Pt again states son can assist as needed w/LB ADLs.    Follow Up Recommendations  Supervision/Assistance - 24 hour (pt d/n qualify for OT services 2/2 hospice status & assist)    Barriers to Discharge       Equipment Recommendations  3 in 1 bedside comode    Recommendations for Other Services    Frequency     Progress towards OT Goals Progress towards OT goals: Goals met/education completed, patient discharged from OT (pending d/c home w/son)  Plan Discharge plan remains appropriate (pt to d/c home w/son 24/7 assist; 3n1, RW, w/c ordered)    Precautions / Restrictions Precautions Precautions: Fall Restrictions LLE Weight Bearing: Weight bearing as tolerated   Pertinent Vitals/Pain Pt states 10/10 pain but no facial grimacing noted. Pt tolerated tx well.    ADL  Grooming: Supervision/safety;Set up Where Assessed - Grooming: Unsupported standing Upper Body Bathing: Set up;Supervision/safety Where Assessed - Upper Body Bathing: Unsupported standing Upper Body Dressing: Supervision/safety Where Assessed - Upper Body Dressing: Supported sitting Lower Body Dressing: Minimal assistance Where Assessed - Lower Body  Dressing: Unsupported sitting Toilet Transfer: Supervision/safety;Min guard Toilet Transfer Method: Sit to stand Toilet Transfer Equipment: Raised toilet seat with arms (or 3-in-1 over toilet) Toileting - Clothing Manipulation and Hygiene: Supervision/safety Where Assessed - Toileting Clothing Manipulation and Hygiene: Sit to stand from 3-in-1 or toilet Equipment Used: Rolling walker Transfers/Ambulation Related to ADLs: Close supervision/stand by assist w/RW ADL Comments: Min A for LLE clothing over feet. Son can assist per pt    OT Diagnosis:    OT Problem List:   OT Treatment Interventions:     OT Goals(current goals can now be found in the care plan section)    Visit Information  Last OT Received On: 02/12/13 Assistance Needed: +1 History of Present Illness: Pt s/p mechanical fall sustaining L hip fx. pt now s/p L IM nail    Subjective Data      Prior Functioning       Cognition  Cognition Arousal/Alertness: Awake/alert Behavior During Therapy: WFL for tasks assessed/performed Overall Cognitive Status: Within Functional Limits for tasks assessed    Mobility  Bed Mobility Bed Mobility: Not assessed Details for Bed Mobility Assistance:  (Pt sitting in recliner upon entering room.) Transfers Transfers: Sit to Stand;Stand to Sit Sit to Stand: 5: Supervision Stand to Sit: 5: Supervision Details for Transfer Assistance:  (progressing; cues due to dec vision; more steady on feet)    Exercises      Balance     End of Session OT - End of Session Activity Tolerance: Patient tolerated treatment well Patient left: in chair;with call bell/phone within reach Nurse Communication: Mobility status;Need for  lift equipment  GO     Jaquala Fuller, Deidre Ala 02/12/2013, 11:48 AM

## 2013-02-12 NOTE — Discharge Summary (Signed)
NAMETAJAI, SUDER             ACCOUNT NO.:  [id number]  MEDICAL RECORD NO.:  [id number]  LOCATION:  5N02C                        FACILITY:  [facility]  PHYSICIAN:  Rosalyn Gess. Adhira Jamil, MD  DATE OF BIRTH:  1920-06-16  DATE OF ADMISSION:  02/08/2013 DATE OF DISCHARGE:  02/12/2013                              DISCHARGE SUMMARY   ADMITTING DIAGNOSES: 1. Intertrochanteric fracture of the left hip. 2. 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 3. Diastolic heart failure, compensated. 4. Hard of hearing.  DISCHARGE DIAGNOSES: 1. Intertrochanteric fracture of the left hip. 2. 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 3. Diastolic heart failure, compensated. 4. Hard of hearing.  CONSULTANTS:  Nadara Mustard, MD for Orthopedics.  PROCEDURES/IMAGING: 1. CT cervical spine on the day of admission with no fracture.  No     dislocation.  Mild motion artifact is noted.  The patient has     degenerative disk disease, C6-7 is unchanged, inferior endplate     compression fracture, C5 is old. 2. CT of the head without contrast at admission, which showed mild     cortical volume loss.  Bilateral remote external capsule lacunar     infarcts are re-identified.  There is no skull fracture.  There is     no acute change. 3. Chest x-ray, one-view on day of admission, which showed enlargement     of the cardiac silhouette with no active disease. 4. Diagnostic femur left on the day of admission, which showed     displaced angulated intertrochanteric fracture, left femur.     Osseous demineralization.  Degenerative changes of the left hip. 5. Diagnostic views pelvis day of admission, which showed displaced     angulated intertrochanteric fracture.  Osseous demineralization.     Post right hip replacement. 6. Operative x-ray of the hip, which showed expected intraoperative     appearance of left dynamic femoral  fixation.  SURGICAL PROCEDURE:  Nailing of the intertrochanteric fracture, left.  HISTORY OF PRESENT ILLNESS:  Ms. Mcginty is a 79 year old woman followed for multiple medical problems including chronic recurrent anemia, history of non-STEMI, history of diastolic heart failure with easy decompensation with fluid overload.  The patient was recently hospitalized for symptomatic anemia.  Please see that prior dictation.  The patient was at home doing well.  She was reaching into the closet for clothing item, lost her balance, fell, landing on her left side sustaining injury to her left hip.  She was unable to bear weight, and had severe pain.  She was brought to the emergency department for evaluation and was found to have a left intertrochanteric fracture and was subsequently admitted.  Please see the H and P for past medical history, family history, social history, as well as physical exam. Please see recent epic records, including last hospitalization.  HOSPITAL COURSE: 1. Orthopedics.  The patient was taken by Dr. Lajoyce Corners to the operating     theater and had nailing of her left hip performed without     complications.  The patient did well in the postop period.  She was     seen  by PT and OT, initially recommending short-term skilled care,     however, the patient was adamant about returning home.  Her     assessment does reflect her ability to manage at home with OT, PT,     and continuing hospice support.  Appropriate durable medical     equipment has been ordered including a lightweight wheelchair.  At     this point, from a surgical/orthopedic perspective, the patient is     ready for discharge home.  She will have to be careful with     weightbearing and balance. 2. Anemia.  The patient at the time of admission had a hemoglobin of     8.1 g.  She was given 1 unit of blood with a rise in hemoglobin to     9.2 g on February 10, 2013.  On February 11, 2013, her hemoglobin has      settled back down to 8.2 g, which is her baseline.  There was no     sign of active bleeding.  She was asymptomatic.  Plan will be     follow up based on symptoms. 3. Cardiac.  The patient with a history of diastolic heart failure and     easy fluid overload.  During this hospitalization even with     transfusion, she remained stable from a cardiac perspective with no     evidence of fluid overload.  No signs of active congestive heart     Failure.  With the patient being stable postop after intertrochanteric     fracture repair and her other medical problems being stable, she is     ready for discharge to home.  Arrangements have been made with     [facility] to resume her care who     will also see to her getting PT and OT in the home.  Appropriate     durable medical equipment is ordered.  DISCHARGE PHYSICAL EXAMINATION:  VITAL SIGNS:  Temperature was 98.7 blood pressure 136/62, heart rate 84, respirations 16, oxygen saturations 93%. GENERAL APPEARANCE:  This is a very elderly woman who looks frail, but is in no acute distress. HEENT:  Normocephalic and atraumatic.  Conjunctiva and sclerae were clear.  The patient has a stable growth at medial aspect of lower eye lid, left.  Conjunctiva and sclerae were clear. NECK:  Supple. CHEST:  No deformities noted. LUNGS:  The patient is moving air well.  There are no rales, no wheezes, no rhonchi.  No increased work of breathing. CARDIOVASCULAR:  2+ radial pulse.  Her precordium is quiet.  Her heart sounds are regular. BREAST:  Deferred. ABDOMEN:  Scaphoid.  Bowel sounds are positive.  No guarding or rebound is noted. EXTREMITIES:  The patient has a surgical dressing at the left lateral hip, which is dry. NEURO:  The patient is hard of hearing.  She is otherwise at her baseline, awake and oriented.  FINAL LABORATORY DATA:  From February 11, 2013, white count is 6200, hemoglobin 8.2 g, hematocrit 24.3%,  platelet count 92,000.  Final chemistry from February 11, 2013, sodium of 134, potassium 3.6, chloride 99, CO2 of 27, BUN 25, creatinine 1.29, glucose of 110.  The patient had liver functions checked at the time of admission, which were normal. The patient had a vitamin D level checked on February 09, 2013, which was in normal range at 43.  DISCHARGE MEDICATIONS: 1. Amlodipine 10 mg daily, to be resumed.  2. Aspirin 325 mg daily for 1 month for DVT prophylaxis after surgery. 3. Nexium 40 mg q.a.m. before breakfast. 4. Zetia 10 mg once daily. 5. Resource or Equivalent 1 can b.i.d. 6. Lasix 40 mg daily. 7. Norco 5/325 one to two tablets q.6 p.r.n. pain. 8. Preparation H with hydrocortisone 1% to use p.r.n. 9. Claritin 10 mg once daily. 10.Lorazepam 0.5 mg b.i.d. p.r.n. anxiety. 11.Metoprolol XL 100 mg 1 p.o. daily. 12.Multivitamin daily. 13.Nitroglycerin 0.4 mg sublingual, one under tongue every 5 minutes     p.r.n. 14.Pravachol 80 mg daily. 15.Altace 10 mg daily. 16.Requip 0.5 mg at bedtime. 17.Senokot-S 2 tablets at bedtime. 18.Sertraline 50 mg daily. 19.Hytrin 1 mg at bedtime.  DISPOSITION:  The patient to be discharged home.  She will be followed up by [facility].  Home Health, PT, and OT are ordered.  Durable medical equipment is ordered.  The patient will required ambulance transport home.  CODE STATUS:  The patient remains a DNR and a hospice patient.  FOLLOWUP:  The patient will be seen at home in 7-10 days for medical follow up.  She will see Dr. Lajoyce Corners for followup as instructed.  The patient's condition at the time of discharge dictation is orthopedically and medically stable with a guarded prognosis.     Rosalyn Gess Karo Rog, MD     MEN/MEDQ  D:  02/12/2013  T:  02/12/2013  Job:  [id number]  cc:   Nadara Mustard, MD [facility]

## 2013-02-12 NOTE — Progress Notes (Signed)
CSW set up [facility] transportation. Clinical Social Worker will sign off for now as social work intervention is no longer needed. Please consult us again if new need arises.   Krystan Northrop, MSW, LCSWA [phone]  

## 2013-02-12 NOTE — Progress Notes (Addendum)
Physical Therapy Treatment Patient Details Name: Darlene [name] MRN: [id number] DOB: 1920-07-09 Today's Date: 02/12/2013 Time: 1191-4782 PT Time Calculation (min): 24 min  PT Assessment / Plan / Recommendation  History of Present Illness Pt s/p mechanical fall sustaining L hip fx. pt now s/p L IM nail   PT Comments   Pt making progress with mobility.  Tolerated increased WBing through LLE with ambulation today.  Pt asking if HHPT will be able to fix her breakfast & when told her that her son will be the one that needs to do that she responds with "He can't do that, he's sick himself" & when questioned if he will be able to help her stand up & walk she states "I don't know".  Evaluating PT, ashly, spoke with pt's son via phone on 02/10/13 & per their discussion he will be able to assist pt as needed.      Follow Up Recommendations  SNF;Home health PT;Supervision/Assistance - 24 hour     Does the patient have the potential to tolerate intense rehabilitation     Barriers to Discharge        Equipment Recommendations  Rolling walker with 5" wheels;Wheelchair (measurements PT);Wheelchair cushion (measurements PT)    Recommendations for Other Services    Frequency Min 5X/week   Progress towards PT Goals Progress towards PT goals: Progressing toward goals  Plan Current plan remains appropriate    Precautions / Restrictions Precautions Precautions: Fall Restrictions LLE Weight Bearing: Weight bearing as tolerated   Pertinent Vitals/Pain C/o LLE pain but did not rate however appeared to tolerate increased WBing compared to yesterday.   Appeared to be comfortable sitting in recliner at end of session.      Mobility  Bed Mobility Bed Mobility: Not assessed Details for Bed Mobility Assistance: Pt sitting on EOB with NT present upon arrival Transfers Transfers: Sit to Stand;Stand to Sit Sit to Stand: 4: Min assist;With upper extremity assist;From bed;From toilet Stand to Sit: 4: Min  assist;With upper extremity assist;With armrests;To chair/3-in-1;To toilet Details for Transfer Assistance: cues for hand placement & body positioning before sitting Ambulation/Gait Ambulation/Gait Assistance: 4: Min assist Ambulation Distance (Feet): 25 Feet (15' + 10' ) Assistive device: Rolling walker Ambulation/Gait Assistance Details: (A) for balance & safety.  Pt appeared to tolerate increased WBing LLE today.   Gait Pattern: Step-to pattern;Decreased weight shift to left;Decreased step length - right Gait velocity: slow Stairs: No Wheelchair Mobility Wheelchair Mobility: No      PT Goals (current goals can now be found in the care plan section) Acute Rehab PT Goals PT Goal Formulation: With patient/family Time For Goal Achievement: 02/17/13 Potential to Achieve Goals: Good  Visit Information  Last PT Received On: 02/12/13 Assistance Needed: +1 History of Present Illness: Pt s/p mechanical fall sustaining L hip fx. pt now s/p L IM nail    Subjective Data      Cognition  Cognition Arousal/Alertness: Awake/alert Behavior During Therapy: WFL for tasks assessed/performed Overall Cognitive Status: Within Functional Limits for tasks assessed    Balance     End of Session PT - End of Session Equipment Utilized During Treatment: Gait belt Activity Tolerance: Patient tolerated treatment well Patient left: in chair;with call bell/phone within reach Nurse Communication: Mobility status   GP     Lara Mulch 02/12/2013, 8:46 AM  Verdell Face, PTA [phone] 02/12/2013

## 2013-02-12 NOTE — Progress Notes (Signed)
Room [facility] 5N 02 - Rumaisa Keeter - [facility] RN Visit-R.Kullen Tomasetti RN  Related admission to [facility] diagnosis of CHF.  Pt is DNR code.    Pt alert & oriented, HOH, visually impaired, walking with OT walking with rolling walker in bathroom to brush teeth and practice sitting on 3&1 over the toilet.  Pt had some complaints of pain while weight bearing.   Staff RN present - discussed going over discharge instructions in detail with son via phone IF he is unable to get to hospital to review in person.   No family present. Patient's home medication list is on shadow chart.   Attending MD ordered BSC, rolling walker and light weight wheelchair in the home along with OT, PT.  Per pt yesterday stated she had a walker and BSC and per CM RN today, son stated he had let [facility] pick these items up.  RE-Ordered on urgent basis with [facility] DME specialist (task msg) as pt discharging today.  Pt will also need 3&1 over the toilet due to fx hip and safety with using BR.  Staff RN questioned if anyone helping pt with her meds as she was asking for narcotics more often than ordered - advised pt have pill box filled by staff RN as it is questionable what services son can assist pt with at this time.  [facility] RN, SW and RN mgr advised of above and discharge today.   Please call [facility] @ [phone]- ask for RN Liaison or after hours,ask for on-call RN with any hospice needs.   Thank you.  [name], RN  [facility]  Hospice Liaison  [phone])

## 2013-02-13 ENCOUNTER — Other Ambulatory Visit: Payer: Self-pay | Admitting: Internal Medicine

## 2013-02-13 ENCOUNTER — Telehealth: Payer: Self-pay | Admitting: *Deleted

## 2013-02-13 NOTE — Telephone Encounter (Signed)
Teress from Hospice called states pts son is not prepared to take of patient at home.  He has requested Hospice to find a Rehab facility for pt.  Teress also requests clarification on Sertraline 50mg  daily Rx states pt has been taking Sertraline 100mg  daily.  Please advise

## 2013-02-13 NOTE — Telephone Encounter (Signed)
02/13/2013  [facility] called with Rosey Bath on th line to add to previous message.  Son of pt is stating that pt is in a lot of pain due to decrease in pain medications.  Please advise.

## 2013-02-13 NOTE — Telephone Encounter (Signed)
1. Ok to resume sertraline 100 mg daily 2. Norco 5/325 1 or 2 every 6 hours as needed for pain, # 180 3. Isn't she at home now since hospital discharge? She was offered SNF but declined. OK for social work consult to help with Short Term SNF placement.

## 2013-02-14 NOTE — Telephone Encounter (Signed)
Teress notified of MD message. She states she got a call from patient's son this morning stating she can not get up or move around.

## 2013-02-15 ENCOUNTER — Telehealth: Payer: Self-pay | Admitting: *Deleted

## 2013-02-15 NOTE — Telephone Encounter (Signed)
Darlene [name] called to report pt has revoked her Hospice Service and is going to [facility] for rehab.  If pt leaves rehab and returns home order will need to be placed for Hospice eval.

## 2013-02-18 ENCOUNTER — Non-Acute Institutional Stay (SKILLED_NURSING_FACILITY): Payer: Medicare Other | Admitting: Internal Medicine

## 2013-02-18 DIAGNOSIS — F329 Major depressive disorder, single episode, unspecified: Secondary | ICD-10-CM

## 2013-02-18 DIAGNOSIS — D649 Anemia, unspecified: Secondary | ICD-10-CM

## 2013-02-18 DIAGNOSIS — R531 Weakness: Secondary | ICD-10-CM

## 2013-02-18 DIAGNOSIS — F341 Dysthymic disorder: Secondary | ICD-10-CM

## 2013-02-18 DIAGNOSIS — IMO0002 Reserved for concepts with insufficient information to code with codable children: Secondary | ICD-10-CM

## 2013-02-18 DIAGNOSIS — R5381 Other malaise: Secondary | ICD-10-CM

## 2013-02-18 DIAGNOSIS — E785 Hyperlipidemia, unspecified: Secondary | ICD-10-CM

## 2013-02-18 DIAGNOSIS — I10 Essential (primary) hypertension: Secondary | ICD-10-CM

## 2013-02-18 DIAGNOSIS — I509 Heart failure, unspecified: Secondary | ICD-10-CM

## 2013-02-18 NOTE — Progress Notes (Signed)
Patient ID: Darlene [name], female   DOB: June 29, 1920, 79 y.o.   MRN: [id number]  Darlene [name] and rehab   PCP: Darlene Regulus, MD  Code status- dnr  Allergies  Allergen Reactions  . Antihistamines, Chlorpheniramine-Type Other (See Comments)    unknown  . Chocolate Other (See Comments)    Pt states "makes her sick" and she starts coughing    Chief Complaint: new admit  HPI:  79 y/o female patient is here for rehabilitation after hospital admission with a fall. She had left hip fracture and underwent nailing of left intertrochanteric fracture. She also had anemia and received 1 u prbc transfusion in the hospital. She is here for rehab and has been working with therapy team.  Review of Systems  Constitutional: Negative for fever, chills, weight loss, malaise/fatigue and diaphoresis.  HENT: Negative for congestion, hearing loss and sore throat.   Eyes: Negative for blurred vision, double vision and discharge.  Respiratory: Negative for cough, sputum production, shortness of breath and wheezing.   Cardiovascular: Negative for chest pain, palpitations, orthopnea and leg swelling.  Gastrointestinal: Negative for heartburn, nausea, vomiting, abdominal pain, diarrhea and constipation.  Genitourinary: Negative for dysuria, urgency, frequency and flank pain.  Musculoskeletal: Negative for back pain, falls Skin: Negative for itching and rash.  Neurological: Positive for weakness. Negative for dizziness, tingling, focal weakness and headaches.  Psychiatric/Behavioral: Negative for depression   Past Medical History  Diagnosis Date  . UTI (urinary tract infection)   . IBS (irritable bowel syndrome)   . Abdominal pain, left lower quadrant   . CAD (coronary artery disease)   . Pneumonia   . HTN (hypertension)   . Hyperlipidemia   . Diverticulosis of colon   . History of colon cancer   . Allergic rhinitis   . Angina   . Cancer   . Arthritis   . Shortness of breath   . Anemia   .  Blood transfusion   . Anxiety   . CHF (congestive heart failure)   . GERD (gastroesophageal reflux disease)   . Peripheral vascular disease    Past Surgical History  Procedure Laterality Date  . Hemicolectomy  1991    Right for mgt of colon cancer.  . Angioplasty  1989    Stent x 2  . Appendectomy    . Bilateral salpingoophorectomy    . Breast surgery      benign  . Orif hip fracture  12/2009    Dr Darlene [name]  . Esophagogastroduodenoscopy  08/09/2011    Procedure: ESOPHAGOGASTRODUODENOSCOPY (EGD);  Surgeon: Darlene Carwin, MD;  Location: [facility] ENDOSCOPY;  Service: Endoscopy;  Laterality: N/A;  . Colonoscopy  10/24/2011    Procedure: COLONOSCOPY;  Surgeon: Darlene Fiedler, MD;  Location: [facility] ENDOSCOPY;  Service: Gastroenterology;  Laterality: N/A;  . Colon surgery    . Intramedullary (im) nail intertrochanteric Left 02/09/2013    Procedure: INTRAMEDULLARY (IM) NAIL INTERTROCHANTRIC;  Surgeon: Darlene Mustard, MD;  Location: [facility] OR;  Service: Orthopedics;  Laterality: Left;   Social History:   reports that she has never smoked. She has never used smokeless tobacco. She reports that she does not drink alcohol or use illicit drugs.  Family History  Problem Relation Age of Onset  . Colon cancer Neg Hx   . Breast cancer Neg Hx     Medications: Patient's Medications  New Prescriptions   No medications on file  Previous Medications   AMLODIPINE (NORVASC) 10 MG TABLET    Take 1  tablet (10 mg total) by mouth daily.   ASPIRIN EC 325 MG EC TABLET    Take 1 tablet (325 mg total) by mouth daily with breakfast.   ASPIRIN EC 325 MG TABLET    Take 1 tablet (325 mg total) by mouth daily.   ESOMEPRAZOLE (NEXIUM) 40 MG CAPSULE    Take 40 mg by mouth daily before breakfast.   EZETIMIBE (ZETIA) 10 MG TABLET    Take 10 mg by mouth at bedtime.   FEEDING SUPPLEMENT, RESOURCE BREEZE, (RESOURCE BREEZE) LIQD    Take 1 Container by mouth 2 (two) times daily between meals.   FUROSEMIDE (LASIX) 40 MG TABLET    TAKE 1  TABLET BY MOUTH EVERY DAY   HYDROCODONE-ACETAMINOPHEN (NORCO/VICODIN) 5-325 MG PER TABLET    Take 1 tablet by mouth 2 (two) times daily as needed for pain.   HYDROCORTISONE CREAM (PREPARATION H HYDROCORTISONE) 1 %    Apply 1 application topically 4 (four) times daily as needed (bleeding hemorrhoids).   LORATADINE (CLARITIN) 10 MG TABLET    Take 1 tablet (10 mg total) by mouth daily.   LORAZEPAM (ATIVAN) 0.5 MG TABLET    Take 0.5 mg by mouth 2 (two) times daily as needed for anxiety.    METOPROLOL SUCCINATE (TOPROL-XL) 100 MG 24 HR TABLET    Take 1 tablet (100 mg total) by mouth daily.   MULTIPLE VITAMIN (MULTIVITAMIN WITH MINERALS) TABS TABLET    Take 1 tablet by mouth daily. Centrum Silver   NITROGLYCERIN (NITROSTAT) 0.4 MG SL TABLET    [name] 0.4 mg under the tongue every 5 (five) minutes as needed for chest pain.   PRAVASTATIN (PRAVACHOL) 80 MG TABLET    Take 80 mg by mouth at bedtime.   RAMIPRIL (ALTACE) 10 MG TABLET    Take 1 tablet (10 mg total) by mouth daily.   ROPINIROLE (REQUIP) 0.5 MG TABLET    Take 0.5 mg by mouth at bedtime.   SENNA-DOCUSATE (SENOKOT-S) 8.6-50 MG PER TABLET    Take 2 tablets by mouth at bedtime.   SERTRALINE (ZOLOFT) 50 MG TABLET    Take 50 mg by mouth daily.   TERAZOSIN (HYTRIN) 1 MG CAPSULE    Take 1 capsule (1 mg total) by mouth at bedtime.  Modified Medications   No medications on file  Discontinued Medications   No medications on file     Physical Exam: BP 110/60  Pulse 78  Temp(Src) 97.8 F (36.6 C)  Resp 18  SpO2 93%  General- elderly female in no acute distress Head- atraumatic, normocephalic Neck- no lymphadenopathy, no thyromegaly, no jugular vein distension, no carotid bruit Cardiovascular- normal s1,s2 Respiratory- bilateral clear to auscultation, no wheeze, no rhonchi, no crackles Abdomen- bowel sounds present, soft, non tender Musculoskeletal- able to move all 4 extremities, dressing in [name] on the left hip area Neurological- no focal  deficit Psychiatry- alert and oriented to person, [name] and has normal mood and affect  Labs reviewed: Basic Metabolic Panel:  Recent Labs  65/78/46 0148  02/09/13 0135 02/10/13 0534 02/11/13 0405  NA 131*  < > 134* 133* 134*  K 3.6  < > 4.4 4.1 3.6  CL 96  < > 98 97 99  CO2 23  < > 27 28 27   GLUCOSE 151*  < > 146* 124* 110*  BUN 24*  < > 21 21 25*  CREATININE 1.40*  < > 1.17* 1.08 1.29*  CALCIUM 8.4  < > 9.0 8.6 8.5  MG 1.7  --   --   --   --   < > = values in this interval not displayed. Liver Function Tests:  Recent Labs  11/13/12 1109 01/11/13 0148 02/08/13 1928  AST 28 15 20   ALT 14 6 9   ALKPHOS 69 60 76  BILITOT 0.6 0.3 0.3  PROT 6.8 6.1 6.7  ALBUMIN 3.7 3.3* 3.5    Recent Labs  05/23/12 1103  LIPASE 45   No results found for this basename: AMMONIA,  in the last 8760 hours CBC:  Recent Labs  05/23/12 1103  02/08/13 1928 02/09/13 0135 02/10/13 0534 02/11/13 0405  WBC 6.5  < > 7.3 10.2 6.9 6.2  NEUTROABS 5.2  --  5.7  --   --   --   HGB 8.3*  < > 8.9* 8.1* 9.2* 8.2*  HCT 25.9*  < > 27.4* 25.8* 27.5* 24.3*  MCV 87.5  < > 84.6 84.0 85.1 85.3  PLT 151  < > 146* 134* 97* 92*  < > = values in this interval not displayed. Cardiac Enzymes:  Recent Labs  05/23/12 1733 05/24/12 0252 05/24/12 0916  10/07/12 1147 10/07/12 1549 01/11/13 0149  CKTOTAL 72 74 97  --   --   --   --   CKMB 3.2 3.6 4.2*  --   --   --   --   TROPONINI <0.30 <0.30 <0.30  < > <0.30 0.47* <0.30  < > = values in this interval not displayed.  Assessment/Plan  Left Hip fracture- s/p nailing and repair, continue aspirin for dvt prophylaxis. Pain under control with current pain regimen of norco. To work with therapy team as tolerated. Fall precautions and skin care. To follow with orthopedics.  Generalized weakness- in setting of recent fall with fracture, will provide rehabilitation for gait training, assess and have pain medication provided. Also to continue her nutritional  supplement  gerd- symptom under control. Continue nexium  Hyperlipidemia- continue zetia and pravachol for now,monitor  Anemia-s/p blood loss, no active bleed at present. Monitor clinically  CHF- currently euvolemic, continue b blocker and lasix, monitor clinically  hypertension- bp remains stable. Continue amlodipine 10 mg daily, ramipril 10 mg daily and  metoprolol 100 mg daily  Depression and anxiety- continue sertraline for now with lorazepam prn for anxiety and mood  Her hospice services will currently be on hold as she is undergoing physical therapy

## 2013-02-25 ENCOUNTER — Other Ambulatory Visit: Payer: Self-pay | Admitting: *Deleted

## 2013-02-25 MED ORDER — HYDROCODONE-ACETAMINOPHEN 5-325 MG PO TABS: ORAL_TABLET | ORAL | Status: DC

## 2013-02-27 ENCOUNTER — Encounter: Payer: Self-pay | Admitting: Nurse Practitioner

## 2013-03-01 ENCOUNTER — Other Ambulatory Visit: Payer: Self-pay | Admitting: *Deleted

## 2013-03-01 ENCOUNTER — Non-Acute Institutional Stay (SKILLED_NURSING_FACILITY): Payer: Medicare Other | Admitting: Nurse Practitioner

## 2013-03-01 DIAGNOSIS — M25559 Pain in unspecified hip: Secondary | ICD-10-CM

## 2013-03-01 DIAGNOSIS — S72142A Displaced intertrochanteric fracture of left femur, initial encounter for closed fracture: Secondary | ICD-10-CM

## 2013-03-01 DIAGNOSIS — S72143A Displaced intertrochanteric fracture of unspecified femur, initial encounter for closed fracture: Secondary | ICD-10-CM

## 2013-03-01 DIAGNOSIS — R531 Weakness: Secondary | ICD-10-CM

## 2013-03-01 DIAGNOSIS — R5381 Other malaise: Secondary | ICD-10-CM

## 2013-03-01 MED ORDER — MORPHINE SULFATE (CONCENTRATE) 20 MG/ML PO SOLN: ORAL | Status: DC

## 2013-03-01 NOTE — Telephone Encounter (Signed)
rx filled per protocol  

## 2013-03-02 ENCOUNTER — Encounter (HOSPITAL_COMMUNITY): Payer: Self-pay | Admitting: Emergency Medicine

## 2013-03-02 ENCOUNTER — Inpatient Hospital Stay (HOSPITAL_COMMUNITY)
Admission: EM | Admit: 2013-03-02 | Discharge: 2013-03-07 | DRG: 871 | Disposition: A | Discharge status: Hospice | Payer: Medicare Other | Attending: Internal Medicine | Admitting: Internal Medicine

## 2013-03-02 DIAGNOSIS — Z515 Encounter for palliative care: Secondary | ICD-10-CM

## 2013-03-02 DIAGNOSIS — I509 Heart failure, unspecified: Secondary | ICD-10-CM | POA: Diagnosis present

## 2013-03-02 DIAGNOSIS — F329 Major depressive disorder, single episode, unspecified: Secondary | ICD-10-CM

## 2013-03-02 DIAGNOSIS — K5090 Crohn's disease, unspecified, without complications: Secondary | ICD-10-CM | POA: Diagnosis present

## 2013-03-02 DIAGNOSIS — E860 Dehydration: Secondary | ICD-10-CM | POA: Diagnosis present

## 2013-03-02 DIAGNOSIS — I2510 Atherosclerotic heart disease of native coronary artery without angina pectoris: Secondary | ICD-10-CM | POA: Diagnosis present

## 2013-03-02 DIAGNOSIS — I739 Peripheral vascular disease, unspecified: Secondary | ICD-10-CM | POA: Diagnosis present

## 2013-03-02 DIAGNOSIS — I252 Old myocardial infarction: Secondary | ICD-10-CM

## 2013-03-02 DIAGNOSIS — I5022 Chronic systolic (congestive) heart failure: Secondary | ICD-10-CM | POA: Diagnosis present

## 2013-03-02 DIAGNOSIS — J449 Chronic obstructive pulmonary disease, unspecified: Secondary | ICD-10-CM | POA: Diagnosis present

## 2013-03-02 DIAGNOSIS — I10 Essential (primary) hypertension: Secondary | ICD-10-CM

## 2013-03-02 DIAGNOSIS — R627 Adult failure to thrive: Secondary | ICD-10-CM | POA: Diagnosis present

## 2013-03-02 DIAGNOSIS — K589 Irritable bowel syndrome without diarrhea: Secondary | ICD-10-CM | POA: Diagnosis present

## 2013-03-02 DIAGNOSIS — E2749 Other adrenocortical insufficiency: Secondary | ICD-10-CM | POA: Diagnosis present

## 2013-03-02 DIAGNOSIS — Z85038 Personal history of other malignant neoplasm of large intestine: Secondary | ICD-10-CM

## 2013-03-02 DIAGNOSIS — N189 Chronic kidney disease, unspecified: Secondary | ICD-10-CM | POA: Diagnosis present

## 2013-03-02 DIAGNOSIS — I129 Hypertensive chronic kidney disease with stage 1 through stage 4 chronic kidney disease, or unspecified chronic kidney disease: Secondary | ICD-10-CM | POA: Diagnosis present

## 2013-03-02 DIAGNOSIS — J189 Pneumonia, unspecified organism: Secondary | ICD-10-CM | POA: Diagnosis present

## 2013-03-02 DIAGNOSIS — E875 Hyperkalemia: Secondary | ICD-10-CM

## 2013-03-02 DIAGNOSIS — K219 Gastro-esophageal reflux disease without esophagitis: Secondary | ICD-10-CM | POA: Diagnosis present

## 2013-03-02 DIAGNOSIS — N179 Acute kidney failure, unspecified: Secondary | ICD-10-CM | POA: Diagnosis present

## 2013-03-02 DIAGNOSIS — Z9181 History of falling: Secondary | ICD-10-CM

## 2013-03-02 DIAGNOSIS — C44111 Basal cell carcinoma of skin of unspecified eyelid, including canthus: Secondary | ICD-10-CM

## 2013-03-02 DIAGNOSIS — G9340 Encephalopathy, unspecified: Secondary | ICD-10-CM | POA: Diagnosis present

## 2013-03-02 DIAGNOSIS — E861 Hypovolemia: Secondary | ICD-10-CM | POA: Diagnosis present

## 2013-03-02 DIAGNOSIS — D649 Anemia, unspecified: Secondary | ICD-10-CM | POA: Diagnosis present

## 2013-03-02 DIAGNOSIS — Z66 Do not resuscitate: Secondary | ICD-10-CM | POA: Diagnosis present

## 2013-03-02 DIAGNOSIS — E785 Hyperlipidemia, unspecified: Secondary | ICD-10-CM | POA: Diagnosis present

## 2013-03-02 DIAGNOSIS — J4489 Other specified chronic obstructive pulmonary disease: Secondary | ICD-10-CM | POA: Diagnosis present

## 2013-03-02 DIAGNOSIS — F411 Generalized anxiety disorder: Secondary | ICD-10-CM | POA: Diagnosis present

## 2013-03-02 DIAGNOSIS — N19 Unspecified kidney failure: Secondary | ICD-10-CM

## 2013-03-02 DIAGNOSIS — R579 Shock, unspecified: Secondary | ICD-10-CM

## 2013-03-02 DIAGNOSIS — Z7982 Long term (current) use of aspirin: Secondary | ICD-10-CM

## 2013-03-02 DIAGNOSIS — A419 Sepsis, unspecified organism: Principal | ICD-10-CM | POA: Diagnosis present

## 2013-03-02 DIAGNOSIS — D638 Anemia in other chronic diseases classified elsewhere: Secondary | ICD-10-CM | POA: Diagnosis present

## 2013-03-02 DIAGNOSIS — M129 Arthropathy, unspecified: Secondary | ICD-10-CM | POA: Diagnosis present

## 2013-03-02 DIAGNOSIS — Z79899 Other long term (current) drug therapy: Secondary | ICD-10-CM

## 2013-03-02 DIAGNOSIS — F0390 Unspecified dementia without behavioral disturbance: Secondary | ICD-10-CM | POA: Diagnosis present

## 2013-03-02 DIAGNOSIS — Z87440 Personal history of urinary (tract) infections: Secondary | ICD-10-CM

## 2013-03-02 DIAGNOSIS — F29 Unspecified psychosis not due to a substance or known physiological condition: Secondary | ICD-10-CM | POA: Diagnosis present

## 2013-03-02 NOTE — ED Notes (Signed)
Bed: WA20 Expected date: 03/02/13 Expected time: 11:15 PM Means of arrival: Ambulance Comments: 79 yo F  Hip pain uncontrolled by pain meds

## 2013-03-03 ENCOUNTER — Emergency Department (HOSPITAL_COMMUNITY): Payer: Medicare Other

## 2013-03-03 ENCOUNTER — Encounter (HOSPITAL_COMMUNITY): Payer: Self-pay | Admitting: *Deleted

## 2013-03-03 DIAGNOSIS — R579 Shock, unspecified: Secondary | ICD-10-CM | POA: Diagnosis present

## 2013-03-03 DIAGNOSIS — N179 Acute kidney failure, unspecified: Secondary | ICD-10-CM

## 2013-03-03 DIAGNOSIS — G9340 Encephalopathy, unspecified: Secondary | ICD-10-CM | POA: Diagnosis present

## 2013-03-03 DIAGNOSIS — F329 Major depressive disorder, single episode, unspecified: Secondary | ICD-10-CM | POA: Insufficient documentation

## 2013-03-03 DIAGNOSIS — I509 Heart failure, unspecified: Secondary | ICD-10-CM | POA: Insufficient documentation

## 2013-03-03 LAB — CBC
HCT: 26.7 % — ABNORMAL LOW (ref 36.0–46.0)
Hemoglobin: 8.7 g/dL — ABNORMAL LOW (ref 12.0–15.0)
MCH: 29.5 pg (ref 26.0–34.0)
MCHC: 32.6 g/dL (ref 30.0–36.0)
MCV: 90.5 fL (ref 78.0–100.0)
Platelets: 183 K/uL (ref 150–400)
RBC: 2.95 MIL/uL — ABNORMAL LOW (ref 3.87–5.11)
RDW: 19.4 % — ABNORMAL HIGH (ref 11.5–15.5)
WBC: 13.1 K/uL — ABNORMAL HIGH (ref 4.0–10.5)

## 2013-03-03 LAB — TROPONIN I: Troponin I: <0.3 ng/mL (ref <0.30)

## 2013-03-03 LAB — BLOOD GAS, ARTERIAL
Bicarbonate: 20 mEq/L (ref 20.0–24.0)
Drawn by: 308601
O2 Saturation: 94.4 %
Patient temperature: 98.6
pCO2 arterial: 37.7 mmHg (ref 35.0–45.0)
pH, Arterial: 7.345 — ABNORMAL LOW (ref 7.350–7.450)

## 2013-03-03 LAB — URINALYSIS, ROUTINE W REFLEX MICROSCOPIC
Bilirubin Urine: NEGATIVE
Glucose, UA: NEGATIVE mg/dL
Ketones, ur: NEGATIVE mg/dL
Ketones, ur: NEGATIVE mg/dL
Nitrite: NEGATIVE
Nitrite: NEGATIVE
Protein, ur: NEGATIVE mg/dL
Specific Gravity, Urine: 1.023 (ref 1.005–1.030)
Urobilinogen, UA: 0.2 mg/dL (ref 0.0–1.0)
pH: 5 (ref 5.0–8.0)

## 2013-03-03 LAB — CLOSTRIDIUM DIFFICILE BY PCR: Toxigenic C. Difficile by PCR: NEGATIVE

## 2013-03-03 LAB — AMYLASE: Amylase: 49 U/L (ref 0–105)

## 2013-03-03 LAB — BLOOD GAS, VENOUS
Acid-base deficit: 3.7 mmol/L — ABNORMAL HIGH (ref 0.0–2.0)
Bicarbonate: 21.4 mEq/L (ref 20.0–24.0)
FIO2: 0.21 %
pCO2, Ven: 42.1 mmHg — ABNORMAL LOW (ref 45.0–50.0)
pO2, Ven: 74.1 mmHg — ABNORMAL HIGH (ref 30.0–45.0)

## 2013-03-03 LAB — CBC WITH DIFFERENTIAL/PLATELET
Basophils Absolute: 0 10*3/uL (ref 0.0–0.1)
Basophils Absolute: 0 10*3/uL (ref 0.0–0.1)
Basophils Relative: 0 % (ref 0–1)
Eosinophils Absolute: 0 10*3/uL (ref 0.0–0.7)
Eosinophils Absolute: 0 10*3/uL (ref 0.0–0.7)
HCT: 20.9 % — ABNORMAL LOW (ref 36.0–46.0)
HCT: 26.3 % — ABNORMAL LOW (ref 36.0–46.0)
Hemoglobin: 6.7 g/dL — CL (ref 12.0–15.0)
Hemoglobin: 8.6 g/dL — ABNORMAL LOW (ref 12.0–15.0)
Lymphocytes Relative: 10 % — ABNORMAL LOW (ref 12–46)
Lymphs Abs: 0.9 10*3/uL (ref 0.7–4.0)
MCH: 29.6 pg (ref 26.0–34.0)
MCH: 29.7 pg (ref 26.0–34.0)
MCHC: 32.1 g/dL (ref 30.0–36.0)
MCHC: 32.7 g/dL (ref 30.0–36.0)
Monocytes Absolute: 0.5 10*3/uL (ref 0.1–1.0)
Monocytes Absolute: 1.1 10*3/uL — ABNORMAL HIGH (ref 0.1–1.0)
Monocytes Relative: 7 % (ref 3–12)
Neutro Abs: 13.8 10*3/uL — ABNORMAL HIGH (ref 1.7–7.7)
Neutro Abs: 7.7 10*3/uL (ref 1.7–7.7)
Neutrophils Relative %: 88 % — ABNORMAL HIGH (ref 43–77)
RBC: 2.26 MIL/uL — ABNORMAL LOW (ref 3.87–5.11)
RDW: 19.3 % — ABNORMAL HIGH (ref 11.5–15.5)
RDW: 19.4 % — ABNORMAL HIGH (ref 11.5–15.5)

## 2013-03-03 LAB — URINE MICROSCOPIC-ADD ON

## 2013-03-03 LAB — CARBOXYHEMOGLOBIN
Carboxyhemoglobin: 2 % — ABNORMAL HIGH (ref 0.5–1.5)
Methemoglobin: 1.3 % (ref 0.0–1.5)
O2 Saturation: 69.9 %
Total hemoglobin: 8.7 g/dL — ABNORMAL LOW (ref 12.0–16.0)

## 2013-03-03 LAB — BASIC METABOLIC PANEL
BUN: 89 mg/dL — ABNORMAL HIGH (ref 6–23)
Chloride: 83 mEq/L — ABNORMAL LOW (ref 96–112)
Creatinine, Ser: 6.77 mg/dL — ABNORMAL HIGH (ref 0.50–1.10)
GFR calc Af Amer: 6 mL/min — ABNORMAL LOW (ref >90)
GFR calc non Af Amer: 5 mL/min — ABNORMAL LOW (ref >90)

## 2013-03-03 LAB — CORTISOL: Cortisol, Plasma: 15.8 ug/dL

## 2013-03-03 LAB — COMPREHENSIVE METABOLIC PANEL
ALT: 18 U/L (ref 0–35)
Albumin: 2.1 g/dL — ABNORMAL LOW (ref 3.5–5.2)
Alkaline Phosphatase: 117 U/L (ref 39–117)
BUN: 73 mg/dL — ABNORMAL HIGH (ref 6–23)
Calcium: 7.4 mg/dL — ABNORMAL LOW (ref 8.4–10.5)
Chloride: 104 mEq/L (ref 96–112)
GFR calc Af Amer: 8 mL/min — ABNORMAL LOW (ref >90)
Potassium: 4.4 mEq/L (ref 3.5–5.1)
Sodium: 136 mEq/L (ref 135–145)
Total Protein: 4.8 g/dL — ABNORMAL LOW (ref 6.0–8.3)

## 2013-03-03 LAB — LACTIC ACID, PLASMA: Lactic Acid, Venous: 0.6 mmol/L (ref 0.5–2.2)

## 2013-03-03 LAB — OSMOLALITY, URINE: Osmolality, Ur: 348 mOsm/kg — ABNORMAL LOW (ref 390–1090)

## 2013-03-03 LAB — MRSA PCR SCREENING: MRSA by PCR: NEGATIVE

## 2013-03-03 LAB — LIPASE, BLOOD: Lipase: 69 U/L — ABNORMAL HIGH (ref 11–59)

## 2013-03-03 LAB — OSMOLALITY: Osmolality: 300 mOsm/kg (ref 275–300)

## 2013-03-03 LAB — C-REACTIVE PROTEIN: CRP: 16 mg/dL — ABNORMAL HIGH (ref <0.60)

## 2013-03-03 LAB — PREPARE RBC (CROSSMATCH)

## 2013-03-03 LAB — CG4 I-STAT (LACTIC ACID): Lactic Acid, Venous: 1.18 mmol/L (ref 0.5–2.2)

## 2013-03-03 LAB — CREATININE, URINE, RANDOM: Creatinine, Urine: 92.5 mg/dL

## 2013-03-03 LAB — MAGNESIUM: Magnesium: 2.1 mg/dL (ref 1.5–2.5)

## 2013-03-03 LAB — PROTIME-INR: Prothrombin Time: 17.3 seconds — ABNORMAL HIGH (ref 11.6–15.2)

## 2013-03-03 MED ORDER — HYDROMORPHONE HCL PF 1 MG/ML IJ SOLN
INTRAMUSCULAR | Status: AC
  Filled 2013-03-03: qty 1

## 2013-03-03 MED ORDER — SODIUM CHLORIDE 0.9 % IV SOLN
250.0000 mL | INTRAVENOUS | Status: DC | PRN
  Administered 2013-03-06: 250 mL via INTRAVENOUS

## 2013-03-03 MED ORDER — SODIUM CHLORIDE 0.9 % IV BOLUS (SEPSIS)
1000.0000 mL | Freq: Once | INTRAVENOUS | Status: AC
  Administered 2013-03-03: 1000 mL via INTRAVENOUS

## 2013-03-03 MED ORDER — SODIUM CHLORIDE 0.9 % IV SOLN
3.0000 g | Freq: Once | INTRAVENOUS | Status: AC
  Administered 2013-03-03: 3 g via INTRAVENOUS
  Filled 2013-03-03: qty 3

## 2013-03-03 MED ORDER — PANTOPRAZOLE SODIUM 40 MG IV SOLR
40.0000 mg | Freq: Every day | INTRAVENOUS | Status: DC
  Administered 2013-03-03 – 2013-03-06 (×4): 40 mg via INTRAVENOUS
  Filled 2013-03-03 (×6): qty 40

## 2013-03-03 MED ORDER — FENTANYL CITRATE 0.05 MG/ML IJ SOLN
25.0000 ug | INTRAMUSCULAR | Status: DC | PRN
  Administered 2013-03-03: 50 ug via INTRAVENOUS
  Administered 2013-03-03: 75 ug via INTRAVENOUS
  Administered 2013-03-03: 50 ug via INTRAVENOUS
  Administered 2013-03-03: 100 ug via INTRAVENOUS
  Administered 2013-03-03: 50 ug via INTRAVENOUS
  Administered 2013-03-04 (×2): 100 ug via INTRAVENOUS
  Administered 2013-03-04 (×2): 50 ug via INTRAVENOUS
  Administered 2013-03-04: 100 ug via INTRAVENOUS
  Administered 2013-03-04: 50 ug via INTRAVENOUS
  Administered 2013-03-04 – 2013-03-05 (×4): 100 ug via INTRAVENOUS
  Filled 2013-03-03 (×15): qty 2

## 2013-03-03 MED ORDER — VASOPRESSIN 20 UNIT/ML IJ SOLN
0.0300 [IU]/min | INTRAVENOUS | Status: DC
  Administered 2013-03-03: 0.03 [IU]/min via INTRAVENOUS
  Filled 2013-03-03 (×2): qty 2.5

## 2013-03-03 MED ORDER — DOBUTAMINE IN D5W 4-5 MG/ML-% IV SOLN
2.5000 ug/kg/min | INTRAVENOUS | Status: DC
  Filled 2013-03-03: qty 250

## 2013-03-03 MED ORDER — HALOPERIDOL LACTATE 5 MG/ML IJ SOLN
INTRAMUSCULAR | Status: AC
  Administered 2013-03-03: 5 mg
  Filled 2013-03-03: qty 1

## 2013-03-03 MED ORDER — MIDAZOLAM HCL 2 MG/2ML IJ SOLN
2.0000 mg | Freq: Once | INTRAMUSCULAR | Status: AC
  Administered 2013-03-03: 2 mg via INTRAVENOUS
  Filled 2013-03-03: qty 2

## 2013-03-03 MED ORDER — VITAMINS A & D EX OINT
TOPICAL_OINTMENT | CUTANEOUS | Status: AC
  Filled 2013-03-03: qty 5

## 2013-03-03 MED ORDER — HYDROMORPHONE HCL PF 1 MG/ML IJ SOLN
1.0000 mg | Freq: Once | INTRAMUSCULAR | Status: AC
  Administered 2013-03-03: 1 mg via INTRAVENOUS

## 2013-03-03 MED ORDER — ACETAMINOPHEN 10 MG/ML IV SOLN
1000.0000 mg | Freq: Four times a day (QID) | INTRAVENOUS | Status: AC
  Administered 2013-03-03 – 2013-03-04 (×4): 1000 mg via INTRAVENOUS
  Filled 2013-03-03 (×5): qty 100

## 2013-03-03 MED ORDER — SODIUM CHLORIDE 0.9 % IV BOLUS (SEPSIS): 1000.0000 mL | INTRAVENOUS | Status: DC | PRN

## 2013-03-03 MED ORDER — PHENYLEPHRINE HCL 10 MG/ML IJ SOLN
20.0000 ug/min | INTRAVENOUS | Status: DC
  Administered 2013-03-03: 20 ug/min via INTRAVENOUS
  Filled 2013-03-03: qty 1

## 2013-03-03 MED ORDER — HYDROMORPHONE HCL PF 1 MG/ML IJ SOLN
1.0000 mg | Freq: Once | INTRAMUSCULAR | Status: AC
  Administered 2013-03-03: 1 mg via INTRAVENOUS
  Filled 2013-03-03: qty 1

## 2013-03-03 MED ORDER — PIPERACILLIN-TAZOBACTAM IN DEX 2-0.25 GM/50ML IV SOLN
2.2500 g | Freq: Three times a day (TID) | INTRAVENOUS | Status: DC
  Administered 2013-03-03 – 2013-03-05 (×8): 2.25 g via INTRAVENOUS
  Filled 2013-03-03 (×10): qty 50

## 2013-03-03 MED ORDER — SODIUM POLYSTYRENE SULFONATE 15 GM/60ML PO SUSP
30.0000 g | Freq: Once | ORAL | Status: AC
  Administered 2013-03-03: 30 g via RECTAL
  Filled 2013-03-03: qty 120

## 2013-03-03 MED ORDER — HEPARIN SODIUM (PORCINE) 5000 UNIT/ML IJ SOLN
5000.0000 [IU] | Freq: Two times a day (BID) | INTRAMUSCULAR | Status: DC
  Administered 2013-03-03 – 2013-03-05 (×6): 5000 [IU] via SUBCUTANEOUS
  Filled 2013-03-03 (×10): qty 1

## 2013-03-03 MED ORDER — NOREPINEPHRINE BITARTRATE 1 MG/ML IJ SOLN
5.0000 ug/min | INTRAMUSCULAR | Status: DC
  Administered 2013-03-03: 8 ug/min via INTRAVENOUS
  Administered 2013-03-03: 5 ug/min via INTRAVENOUS
  Administered 2013-03-04: 6 ug/min via INTRAVENOUS
  Filled 2013-03-03 (×3): qty 4

## 2013-03-03 MED ORDER — LORAZEPAM 2 MG/ML IJ SOLN
1.0000 mg | Freq: Once | INTRAMUSCULAR | Status: AC
  Administered 2013-03-03: 1 mg via INTRAVENOUS
  Filled 2013-03-03: qty 1

## 2013-03-03 MED ORDER — SODIUM CHLORIDE 0.9 % IV SOLN
INTRAVENOUS | Status: DC
  Administered 2013-03-03 – 2013-03-05 (×3): via INTRAVENOUS

## 2013-03-03 MED ORDER — VANCOMYCIN HCL IN DEXTROSE 1-5 GM/200ML-% IV SOLN
1000.0000 mg | Freq: Once | INTRAVENOUS | Status: AC
  Administered 2013-03-03: 1000 mg via INTRAVENOUS
  Filled 2013-03-03: qty 200

## 2013-03-03 MED ORDER — HALOPERIDOL LACTATE 5 MG/ML IJ SOLN
5.0000 mg | INTRAMUSCULAR | Status: DC | PRN
  Administered 2013-03-04 – 2013-03-05 (×3): 5 mg via INTRAVENOUS
  Filled 2013-03-03 (×3): qty 1

## 2013-03-03 NOTE — Consult Note (Signed)
Darlene Campbell, MD                  Darlene Code, PA-C   [address] [place], [place], [place]  [address]   ORTHOPAEDIC CONSULTATION  Darlene [name]            MRN:  [id number] DOB/SEX:  1921/02/05/female    REQUESTING PHYSICIAN: hospitalist service   CHIEF COMPLAINT:  Painful left hip  HISTORY: Darlene [name] a 78 y.o. female with painful left hip S/P closed reduction and IF with [facility] on 02/09/13. Sent to ER this am from [place] nursing facility wit delirium and "screaming in pain".Has hx CHF,renal failure and acute encephalopathy and presently admitted with hypotension and anemia.No hx recent fall post op.Films in ER note failure of fixation left hip with loss of reduction and screw penetrating through femoral head abutting acetabulum.Ortho consulted.   PAST MEDICAL HISTORY: Patient Active Problem List   Diagnosis Date Noted  . CHF (congestive heart failure) 03/03/2013  . Anxiety and depression 03/03/2013  . Shock circulatory 03/03/2013  . Acute encephalopathy 03/03/2013  . Intertrochanteric fracture 02/08/2013  . Anemia 01/11/2013  . DNR (do not resuscitate) 01/11/2013  . Hospice care patient 01/11/2013  . End of life care 11/13/2012  . Weakness generalized 10/11/2012  . Crohn disease 05/23/2012  . GI (gastrointestinal bleed) 08/07/2011  . Basal cell carcinoma of eyelid 11/09/2010  . DIVERTICULOSIS, COLON 08/05/2008  . IBS 08/05/2008  . HYPERLIPIDEMIA 05/30/2007  . HYPERTENSION 05/30/2007  . CORONARY ARTERY DISEASE 05/30/2007  . ALLERGIC RHINITIS 05/30/2007  . COLON CANCER, HX OF 05/30/2007   Past Medical History  Diagnosis Date  . UTI (urinary tract infection)   . IBS (irritable bowel syndrome)   . Abdominal pain, left lower quadrant   . CAD (coronary artery disease)   . Pneumonia   . HTN (hypertension)   . Hyperlipidemia   . Diverticulosis of colon   . History of colon cancer   . Allergic rhinitis   . Angina   . Cancer   . Arthritis   .  Shortness of breath   . Anemia   . Blood transfusion   . Anxiety   . CHF (congestive heart failure)   . GERD (gastroesophageal reflux disease)   . Peripheral vascular disease    Past Surgical History  Procedure Laterality Date  . Hemicolectomy  1991    Right for mgt of colon cancer.  . Angioplasty  1989    Stent x 2  . Appendectomy    . Bilateral salpingoophorectomy    . Breast surgery      benign  . Orif hip fracture  12/2009    Dr Charlann Boxer  . Esophagogastroduodenoscopy  08/09/2011    Procedure: ESOPHAGOGASTRODUODENOSCOPY (EGD);  Surgeon: Hart Carwin, MD;  Location: [facility] ENDOSCOPY;  Service: Endoscopy;  Laterality: N/A;  . Colonoscopy  10/24/2011    Procedure: COLONOSCOPY;  Surgeon: Beverley Fiedler, MD;  Location: [facility] ENDOSCOPY;  Service: Gastroenterology;  Laterality: N/A;  . Colon surgery    . Intramedullary (im) nail intertrochanteric Left 02/09/2013    Procedure: INTRAMEDULLARY (IM) NAIL INTERTROCHANTRIC;  Surgeon: Nadara Mustard, MD;  Location: [facility] OR;  Service: Orthopedics;  Laterality: Left;     MEDICATIONS:  Current facility-administered medications:0.9 %  sodium chloride infusion, 250 mL, Intravenous, PRN, Kalman Shan, MD;  DOBUTamine (DOBUTREX) infusion 4000 mcg/mL, 2.5 mcg/kg/min, Intravenous, Titrated, Kalman Shan, MD;  fentaNYL (SUBLIMAZE) injection 25-100 mcg, 25-100 mcg, Intravenous, Q2H PRN, Kalman Shan,  MD, 50 mcg at 03/03/13 0850;  heparin injection 5,000 Units, 5,000 Units, Subcutaneous, Q12H, Kalman Shan, MD norepinephrine (LEVOPHED) 4 mg in dextrose 5 % 250 mL infusion, 5-50 mcg/min, Intravenous, Titrated, Kalman Shan, MD, Last Rate: 26.3 mL/hr at 03/03/13 1038, 7 mcg/min at 03/03/13 1038;  pantoprazole (PROTONIX) injection 40 mg, 40 mg, Intravenous, QHS, Kalman Shan, MD phenylephrine (NEO-SYNEPHRINE) 10 mg in dextrose 5 % 250 mL infusion, 20-200 mcg/min, Intravenous, Titrated, Kalman Shan, MD, Last Rate: 30 mL/hr at 03/03/13 0901, 20 mcg/min  at 03/03/13 0901;  piperacillin-tazobactam (ZOSYN) IVPB 2.25 g, 2.25 g, Intravenous, Q8H, Rollene Fare, RPH, 2.25 g at 03/03/13 0956;  sodium chloride 0.9 % bolus 1,000 mL, 1,000 mL, Intravenous, PRN, Kalman Shan, MD vasopressin (PITRESSIN) 50 Units in sodium chloride 0.9 % 250 mL infusion, 0.03 Units/min, Intravenous, Continuous, Kalman Shan, MD, Last Rate: 9 mL/hr at 03/03/13 0945, 0.03 Units/min at 03/03/13 0945  ALLERGIES:   Allergies  Allergen Reactions  . Antihistamines, Chlorpheniramine-Type Other (See Comments)    unknown  . Chocolate Other (See Comments)    Pt states "makes her sick" and she starts coughing    REVIEW OF SYSTEMS: REVIEWED IN DETAIL IN CHART  FAMILY HISTORY:   Family History  Problem Relation Age of Onset  . Colon cancer Neg Hx   . Breast cancer Neg Hx     SOCIAL HISTORY:   History  Substance Use Topics  . Smoking status: Never Smoker   . Smokeless tobacco: Never Used  . Alcohol Use: No     EXAMINATION: Vital signs in last 24 hours: Temp:  [97.8 F (36.6 C)-99.1 F (37.3 C)] 98.7 F (37.1 C) (11/23 1038) Pulse Rate:  [78-134] 118 (11/23 1038) Resp:  [11-22] 16 (11/23 1038) BP: (62-127)/(27-68) 127/45 mmHg (11/23 1038) SpO2:  [93 %-100 %] 100 % (11/23 1038) Weight:  [52 kg (114 lb 10.2 oz)] 52 kg (114 lb 10.2 oz) (11/23 0800)    Musculoskeletal Exam  :mental status altered and pt unable to provide history. LLE is shortened and ER with areas of ecchymosis. Unable to evaluate n/v status. Has pain with ROM left hip.   DIAGNOSTIC STUDIES: Recent laboratory studies:  Recent Labs  03/03/13 0005 03/03/13 0850  WBC 15.8* 9.1  HGB 8.6* 6.7*  HCT 26.3* 20.9*  PLT 296 170    Recent Labs  03/03/13 0005 03/03/13 0850  NA 126* 136  K 5.9* 4.4  CL 83* 104  CO2 22 19  BUN 89* 73*  CREATININE 6.77* 4.83*  GLUCOSE 111* 99  CALCIUM 9.0 7.4*   Lab Results  Component Value Date   INR 1.45 03/03/2013   INR 0.99 02/08/2013    INR 1.00 05/23/2012     Recent Radiographic Studies :  Dg Chest 1 View  02/08/2013   CLINICAL DATA:  Proximal left femoral pain and deformity post fall today  EXAM: CHEST - 1 VIEW  COMPARISON:  01/13/2013  FINDINGS: Enlargement of cardiac silhouette.  Atherosclerotic calcification aorta.  Mediastinal contours and pulmonary vascularity normal.  Subsegmental atelectasis at right upper lobe and at right base.  No acute infiltrate, pleural effusion or pneumothorax.  Mild central peribronchial thickening.  Bones demineralized.  IMPRESSION: Enlargement of cardiac silhouette.  Bronchitic changes with scattered atelectasis.   Electronically Signed   By: Ulyses Southward M.D.   On: 02/08/2013 21:18   Dg Pelvis 1-2 Views  02/08/2013   CLINICAL DATA:  Left proximal femoral pain and deformity post fall today  EXAM: PELVIS -  1-2 VIEW  COMPARISON:  12/10/2008  FINDINGS: Diffuse osseous demineralization.  Right hip prosthesis new since previous exam.  A wall foreign body protuberance is identified along the lateral margin of the proximal right femur, this not appear related to acute injury.  Displaced intertrochanteric fracture left femur with varus angulation.  Narrowing of left hip joint.  Visualized pelvis intact.  Rotated to the left.  Scattered vascular calcification.  IMPRESSION: Displaced angulated intertrochanteric fracture left femur.  Osseous demineralization with degenerative changes of the left hip joint.  Post right hip replacement.   Electronically Signed   By: Ulyses Southward M.D.   On: 02/08/2013 21:17   Dg Hip Operative Left  02/09/2013   CLINICAL DATA:  Intraoperative imaging, left femoral fracture  EXAM: OPERATIVE LEFT HIP  COMPARISON:  02/08/2013  FINDINGS: Two intraoperative fluoroscopic images demonstrate intraoperative dynamic nail fixation of the previously seen left femoral intertrochanteric fracture. No evidence for hardware failure. Fracture fragments are in near anatomic alignment.  IMPRESSION:  Expected intraoperative appearance of left dynamic femoral fixation.   Electronically Signed   By: Christiana Pellant M.D.   On: 02/09/2013 10:06   Dg Femur Left  02/08/2013   CLINICAL DATA:  Larey Seat today, proximal left femoral pain and deformity  EXAM: LEFT FEMUR - 2 VIEW  COMPARISON:  None  FINDINGS: Displaced intertrochanteric fracture left femur with varus angulation.  No dislocation.  Narrowing of left hip joint.  Remainder of left femur appears intact with knee joint alignment normal.  Visualized left pelvis intact.  IMPRESSION: Displaced angulated intertrochanteric fracture left femur.  Osseous demineralization.  Degenerative changes left hip joint.   Electronically Signed   By: Ulyses Southward M.D.   On: 02/08/2013 21:02   Ct Head Wo Contrast  03/03/2013   CLINICAL DATA:  Stroke  EXAM: CT HEAD WITHOUT CONTRAST  TECHNIQUE: Contiguous axial images were obtained from the base of the skull through the vertex without intravenous contrast.  COMPARISON:  Prior CT from 02/08/2013  FINDINGS: Atrophy with chronic microvascular ischemic changes are stable as compared to the prior exam.  There is no acute intracranial hemorrhage or infarct. No mass lesion or midline shift. Gray-white matter differentiation is well maintained. Ventricles are normal in size without evidence of hydrocephalus. CSF containing spaces are within normal limits. No extra-axial fluid collection.  The calvarium is intact.  Orbital soft tissues are within normal limits.  The paranasal sinuses and mastoid air cells are well pneumatized and free of fluid.  Scalp soft tissues are unremarkable.  IMPRESSION: 1. No acute intracranial infarct or other process identified. 2. Stable atrophy and chronic microvascular ischemic changes.   Electronically Signed   By: Rise Mu M.D.   On: 03/03/2013 03:32   Ct Head Wo Contrast  02/08/2013   CLINICAL DATA:  Fall  EXAM: CT HEAD WITHOUT CONTRAST  CT CERVICAL SPINE WITHOUT CONTRAST  TECHNIQUE:  Multidetector CT imaging of the head and cervical spine was performed following the standard protocol without intravenous contrast. Multiplanar CT image reconstructions of the cervical spine were also generated.  COMPARISON:  06/11/2010  FINDINGS: CT HEAD FINDINGS  Mild cortical volume loss noted with proportional ventricular prominence. Areas of periventricular white matter hypodensity are most compatible with small vessel ischemic change. Bilateral remote external capsule lacunar infarcts are reidentified. No midline shift. No skull fracture. Orbits and paranasal sinuses are intact.  CT CERVICAL SPINE FINDINGS  C1 through the cervicothoracic junction is visualized in its entirety. No precervical soft tissue widening.  Inferior endplate compression deformity at C5 again noted, not significantly changed. C6-C7 disk degenerative change noted with minimal neural foraminal narrowing reidentified. No fracture suspect no acute fracture or dislocation. Mild motion artifact noted at the mid cervical spine.  IMPRESSION: No acute intracranial finding.  No acute cervical spine abnormality.   Electronically Signed   By: Christiana Pellant M.D.   On: 02/08/2013 20:09   Ct Cervical Spine Wo Contrast  02/08/2013   CLINICAL DATA:  Fall  EXAM: CT HEAD WITHOUT CONTRAST  CT CERVICAL SPINE WITHOUT CONTRAST  TECHNIQUE: Multidetector CT imaging of the head and cervical spine was performed following the standard protocol without intravenous contrast. Multiplanar CT image reconstructions of the cervical spine were also generated.  COMPARISON:  06/11/2010  FINDINGS: CT HEAD FINDINGS  Mild cortical volume loss noted with proportional ventricular prominence. Areas of periventricular white matter hypodensity are most compatible with small vessel ischemic change. Bilateral remote external capsule lacunar infarcts are reidentified. No midline shift. No skull fracture. Orbits and paranasal sinuses are intact.  CT CERVICAL SPINE FINDINGS  C1  through the cervicothoracic junction is visualized in its entirety. No precervical soft tissue widening. Inferior endplate compression deformity at C5 again noted, not significantly changed. C6-C7 disk degenerative change noted with minimal neural foraminal narrowing reidentified. No fracture suspect no acute fracture or dislocation. Mild motion artifact noted at the mid cervical spine.  IMPRESSION: No acute intracranial finding.  No acute cervical spine abnormality.   Electronically Signed   By: Christiana Pellant M.D.   On: 02/08/2013 20:09   Dg Pelvis Portable  03/03/2013   CLINICAL DATA:  Left hip fracture  EXAM: PORTABLE PELVIS 1-2 VIEWS  COMPARISON:  February 08, 2013  FINDINGS: There is right femoral hemiarthroplasty in good position. The patient has comminuted marked displaced fracture intertrochanteric left femur which was seen on prior x-ray. This has been fixated with a left femoral rod. However, the proximal component of the left femoral rod projects beyond the left femoral neck. There is lucency surrounding the rod at the lateral intertrochanteric region suggesting loosening.  IMPRESSION: The patient has comminuted marked displaced fracture intertrochanteric left femur which was seen on prior x-ray. This has been fixated with a left femoral rod. However, the proximal component of the left femoral rod projects beyond the left femoral neck. There is lucency surrounding the rod at the lateral intertrochanteric region suggesting loosening.   Electronically Signed   By: Sherian Rein M.D.   On: 03/03/2013 01:18   Dg Chest Portable 1 View  03/03/2013   CLINICAL DATA:  Central line placement.  EXAM: PORTABLE CHEST - 1 VIEW  COMPARISON:  Chest x-ray 03/03/2013.  FINDINGS: There is a right-sided subclavian central venous catheter with tip terminating in the superior aspect of the right atrium. Lung volumes are normal. There is cephalization of the pulmonary vasculature and slight indistinctness of the  interstitial markings suggestive of mild pulmonary edema. Mild cardiomegaly. No definite pleural effusions. Upper mediastinal contours are within limits allowing for slight patient rotation. Atherosclerosis in the thoracic aorta. No pneumothorax. New linear opacity in the right apex presumably an area of subsegmental atelectasis.  IMPRESSION: 1. New right subclavian central venous catheter with tip terminating in the superior aspect of the right atrium. This could be withdrawn approximately 2 cm for more optimal placement at the superior cavoatrial junction. 2. Findings suggest mild congestive heart failure, as above. 3. Atherosclerosis. 4. New area of presumed subsegmental atelectasis in the apex of the right  lung.   Electronically Signed   By: Trudie Reed M.D.   On: 03/03/2013 09:02   Dg Chest Portable 1 View  03/03/2013   CLINICAL DATA:  Left hip fracture  EXAM: PORTABLE CHEST - 1 VIEW  COMPARISON:  February 08, 2013  FINDINGS: The heart size and mediastinal contours are stable. The aorta is tortuous. The heart size is enlarged. Both lungs are clear. There is no focal infiltrate, pulmonary edema, or pleural effusion. The visualized skeletal structures are stable.  IMPRESSION: No active cardiopulmonary disease.   Electronically Signed   By: Sherian Rein M.D.   On: 03/03/2013 01:13    ASSESSMENT: failure of left hip fixation with loss of reduction. Pt being treated in ICU for hypotension and possible sepsis-no evidence of infection about left hip. Definitive Rx for hip is revision to head/neck prosthesis if/when pt stabilizes.Will follow   PLAN:  Valeria Batman 03/03/2013, 10:50 AM

## 2013-03-03 NOTE — Progress Notes (Addendum)
Pt with large loose brown/green stool.  Notified Dr. Vassie Loll of need to perform C diff test.  CVP is 6, IVF added at 50 cc/hr per order.  Levophed increased to 8 mcg due to low BP.  Continue to monitor patient closely. Asked about Coox of 59, if Dobutamine to start.  No Dobutamine currently.    Maston Wight Debroah Loop RN

## 2013-03-03 NOTE — ED Provider Notes (Addendum)
CSN: [id number]     Arrival date & time 03/02/13  2332 History   First MD Initiated Contact with Patient 03/02/13 2341     Chief Complaint  Patient presents with  . Extremity Pain    uncontollable pain   (Consider location/radiation/quality/duration/timing/severity/associated sxs/prior Treatment) HPI Comments: LEVEL 5 CAVEAT FOR ALTERED MENTAL STATUS. Darlene [name] is a 79 year old woman followed with past medical hx of recurrent anemia, non-STEMI, history of diastolic heart failure, dementia, sp recent fall with left hip ORIF comes in from nursing home with cc of pain. Nursing home - [facility], we get the answering service. Patient is just screaming, not providing any hx.  Patient is a 79 y.o. female presenting with extremity pain. The history is provided by medical records.  Extremity Pain    Past Medical History  Diagnosis Date  . UTI (urinary tract infection)   . IBS (irritable bowel syndrome)   . Abdominal pain, left lower quadrant   . CAD (coronary artery disease)   . Pneumonia   . HTN (hypertension)   . Hyperlipidemia   . Diverticulosis of colon   . History of colon cancer   . Allergic rhinitis   . Angina   . Cancer   . Arthritis   . Shortness of breath   . Anemia   . Blood transfusion   . Anxiety   . CHF (congestive heart failure)   . GERD (gastroesophageal reflux disease)   . Peripheral vascular disease    Past Surgical History  Procedure Laterality Date  . Hemicolectomy  1991    Right for mgt of colon cancer.  . Angioplasty  1989    Stent x 2  . Appendectomy    . Bilateral salpingoophorectomy    . Breast surgery      benign  . Orif hip fracture  12/2009    Dr Charlann Boxer  . Esophagogastroduodenoscopy  08/09/2011    Procedure: ESOPHAGOGASTRODUODENOSCOPY (EGD);  Surgeon: Hart Carwin, MD;  Location: [facility] ENDOSCOPY;  Service: Endoscopy;  Laterality: N/A;  . Colonoscopy  10/24/2011    Procedure: COLONOSCOPY;  Surgeon: Beverley Fiedler, MD;  Location: [facility] ENDOSCOPY;   Service: Gastroenterology;  Laterality: N/A;  . Colon surgery    . Intramedullary (im) nail intertrochanteric Left 02/09/2013    Procedure: INTRAMEDULLARY (IM) NAIL INTERTROCHANTRIC;  Surgeon: Nadara Mustard, MD;  Location: [facility] OR;  Service: Orthopedics;  Laterality: Left;   Family History  Problem Relation Age of Onset  . Colon cancer Neg Hx   . Breast cancer Neg Hx    History  Substance Use Topics  . Smoking status: Never Smoker   . Smokeless tobacco: Never Used  . Alcohol Use: No   OB History   Grav Para Term Preterm Abortions TAB SAB Ect Mult Living                 Review of Systems  Unable to perform ROS: Mental status change    Allergies  Antihistamines, chlorpheniramine-type and Chocolate  Home Medications   Current Outpatient Rx  Name  Route  Sig  Dispense  Refill  . Amino Acids-Protein Hydrolys (FEEDING SUPPLEMENT, PRO-STAT SUGAR FREE 64,) LIQD   Oral   Take 30 mLs by mouth daily.         Marland Kitchen amLODipine (NORVASC) 10 MG tablet   Oral   Take 1 tablet (10 mg total) by mouth daily.   30 tablet   5   . aspirin EC 325 MG EC tablet  Oral   Take 1 tablet (325 mg total) by mouth daily with breakfast.   30 tablet   0   . atorvastatin (LIPITOR) 10 MG tablet   Oral   Take 10 mg by mouth daily.         . feeding supplement, RESOURCE BREEZE, (RESOURCE BREEZE) LIQD   Oral   Take 1 Container by mouth 2 (two) times daily between meals.      0   . furosemide (LASIX) 40 MG tablet      TAKE 1 TABLET BY MOUTH EVERY DAY   30 tablet   5   . HYDROcodone-acetaminophen (NORCO) 10-325 MG per tablet   Oral   Take 1 tablet by mouth every 6 (six) hours as needed (pain).         . hydrocortisone cream (PREPARATION H HYDROCORTISONE) 1 %   Topical   Apply 1 application topically 4 (four) times daily as needed (bleeding hemorrhoids).         . loratadine (CLARITIN) 10 MG tablet   Oral   Take 1 tablet (10 mg total) by mouth daily.   30 tablet   5   . LORazepam  (ATIVAN) 0.5 MG tablet   Oral   Take 0.5 mg by mouth 2 (two) times daily as needed for anxiety.          . metoprolol succinate (TOPROL-XL) 100 MG 24 hr tablet   Oral   Take 1 tablet (100 mg total) by mouth daily.   30 tablet   5   . nitroGLYCERIN (NITROSTAT) 0.4 MG SL tablet   Sublingual   [name] 0.4 mg under the tongue every 5 (five) minutes as needed for chest pain.         Marland Kitchen omeprazole (PRILOSEC) 20 MG capsule   Oral   Take 20 mg by mouth daily.         Marland Kitchen PRESCRIPTION MEDICATION   Topical   Apply 1 mL topically 1 day or 1 dose. Ativan Gel  0.25mg /11ml         . ramipril (ALTACE) 10 MG tablet   Oral   Take 1 tablet (10 mg total) by mouth daily.   30 tablet   5   . rOPINIRole (REQUIP) 0.5 MG tablet   Oral   Take 0.5 mg by mouth at bedtime.         . senna-docusate (SENOKOT-S) 8.6-50 MG per tablet   Oral   Take 2 tablets by mouth at bedtime.   60 tablet   11   . sertraline (ZOLOFT) 50 MG tablet   Oral   Take 50 mg by mouth daily.         Marland Kitchen terazosin (HYTRIN) 1 MG capsule   Oral   Take 1 capsule (1 mg total) by mouth at bedtime.   30 capsule   5   . ciprofloxacin (CIPRO) 250 MG tablet   Oral   Take 250 mg by mouth 2 (two) times daily.         Marland Kitchen ezetimibe (ZETIA) 10 MG tablet   Oral   Take 10 mg by mouth at bedtime.         Marland Kitchen HYDROcodone-acetaminophen (NORCO/VICODIN) 5-325 MG per tablet      Take one tablet by mouth every 6 hours as needed for pain   120 tablet   0   . morphine (ROXANOL) 20 MG/ML concentrated solution      Take 0.25 ml = 5 mg by  mouth every four hours  *DOUBLE CHECK DOSE. USE CALIBRATED SYRINGE*   120 mL   0   . Multiple Vitamin (MULTIVITAMIN WITH MINERALS) TABS tablet   Oral   Take 1 tablet by mouth daily. Centrum Silver         . pravastatin (PRAVACHOL) 80 MG tablet   Oral   Take 80 mg by mouth at bedtime.          BP 92/29  Pulse 86  Temp(Src) 99.1 F (37.3 C) (Rectal)  Resp 20  SpO2 94% Physical  Exam  Nursing note and vitals reviewed. Constitutional: She is oriented to person, [name], and time. She appears well-developed.  HENT:  Head: Normocephalic and atraumatic.  EXTREMELY DRY MUCUS MEMBRANES  Eyes: EOM are normal. Pupils are equal, round, and reactive to light.  Neck: Neck supple.  Cardiovascular: Normal rate, normal heart sounds and intact distal pulses.   Pulmonary/Chest: Effort normal. No respiratory distress.  Abdominal: Soft. She exhibits no distension. There is no tenderness. There is no rebound and no guarding.  Musculoskeletal:  Left hip tenderness, no deformity  Neurological: She is alert and oriented to person, [name], and time.  Skin: Skin is warm and dry. Rash noted.  Diffuse ecchymoses to the LLE    ED Course  Procedures (including critical care time) Labs Review Labs Reviewed  CBC WITH DIFFERENTIAL - Abnormal; Notable for the following:    WBC 15.8 (*)    RBC 2.90 (*)    Hemoglobin 8.6 (*)    HCT 26.3 (*)    RDW 19.3 (*)    Neutrophils Relative % 88 (*)    Neutro Abs 13.8 (*)    Lymphocytes Relative 5 (*)    Monocytes Absolute 1.1 (*)    All other components within normal limits  BASIC METABOLIC PANEL - Abnormal; Notable for the following:    Sodium 126 (*)    Potassium 5.9 (*)    Chloride 83 (*)    Glucose, Bld 111 (*)    BUN 89 (*)    Creatinine, Ser 6.77 (*)    GFR calc non Af Amer 5 (*)    GFR calc Af Amer 6 (*)    All other components within normal limits  URINALYSIS, ROUTINE W REFLEX MICROSCOPIC - Abnormal; Notable for the following:    Color, Urine AMBER (*)    APPearance CLOUDY (*)    Specific Gravity, Urine 1.031 (*)    Bilirubin Urine SMALL (*)    Leukocytes, UA SMALL (*)    All other components within normal limits  URINE MICROSCOPIC-ADD ON - Abnormal; Notable for the following:    Bacteria, UA FEW (*)    All other components within normal limits  SEDIMENTATION RATE - Abnormal; Notable for the following:    Sed Rate 85 (*)     All other components within normal limits  BLOOD GAS, VENOUS - Abnormal; Notable for the following:    pH, Ven 7.327 (*)    pCO2, Ven 42.1 (*)    pO2, Ven 74.1 (*)    Acid-base deficit 3.7 (*)    All other components within normal limits  URINE CULTURE  CULTURE, BLOOD (ROUTINE X 2)  CULTURE, BLOOD (ROUTINE X 2)  TROPONIN I  C-REACTIVE PROTEIN   Imaging Review Dg Pelvis Portable  03/03/2013   CLINICAL DATA:  Left hip fracture  EXAM: PORTABLE PELVIS 1-2 VIEWS  COMPARISON:  February 08, 2013  FINDINGS: There is right femoral hemiarthroplasty in good position.  The patient has comminuted marked displaced fracture intertrochanteric left femur which was seen on prior x-ray. This has been fixated with a left femoral rod. However, the proximal component of the left femoral rod projects beyond the left femoral neck. There is lucency surrounding the rod at the lateral intertrochanteric region suggesting loosening.  IMPRESSION: The patient has comminuted marked displaced fracture intertrochanteric left femur which was seen on prior x-ray. This has been fixated with a left femoral rod. However, the proximal component of the left femoral rod projects beyond the left femoral neck. There is lucency surrounding the rod at the lateral intertrochanteric region suggesting loosening.   Electronically Signed   By: Sherian Rein M.D.   On: 03/03/2013 01:18   Dg Chest Portable 1 View  03/03/2013   CLINICAL DATA:  Left hip fracture  EXAM: PORTABLE CHEST - 1 VIEW  COMPARISON:  February 08, 2013  FINDINGS: The heart size and mediastinal contours are stable. The aorta is tortuous. The heart size is enlarged. Both lungs are clear. There is no focal infiltrate, pulmonary edema, or pleural effusion. The visualized skeletal structures are stable.  IMPRESSION: No active cardiopulmonary disease.   Electronically Signed   By: Sherian Rein M.D.   On: 03/03/2013 01:13    EKG Interpretation    Date/Time:  Sunday March 03 2013  02:41:15 EST Ventricular Rate:  100 PR Interval:    QRS Duration: 157 QT Interval:  421 QTC Calculation: 543 R Axis:   106 Text Interpretation:  Atrial flutter with predominant 3:1 AV block Ventricular premature complex IVCD, consider atypical RBBB Anteroseptal infarct, age indeterminate Baseline wander in lead(s) V1 ARTIFACT - UNABLE TO INTERPRET WELL Confirmed by Rhunette Croft, MD, Coren Crownover [phone]) on 03/03/2013 2:52:50 AM                CENTRAL LINE Performed by: Derwood Kaplan Consent: The procedure was performed in an emergent situation. Required items: required blood products, implants, devices, and special equipment available Patient identity confirmed: arm band and provided demographic data Time out: Immediately prior to procedure a "time out" was called to verify the correct patient, procedure, equipment, support staff and site/side marked as required. Indications: vascular access Anesthesia: local infiltration Local anesthetic: lidocaine 1% with epinephrine Anesthetic total: 3 ml Patient sedated: no Preparation: skin prepped with 2% chlorhexidine Skin prep agent dried: skin prep agent completely dried prior to procedure Sterile barriers: all five maximum sterile barriers used - cap, mask, sterile gown, sterile gloves, and large sterile sheet Hand hygiene: hand hygiene performed prior to central venous catheter insertion  Location details: right subclavian central line  Catheter type: triple lumen Catheter size: 8 Fr Pre-procedure: landmarks identified Ultrasound guidance: no Successful placement: yes Post-procedure: line sutured and dressing applied Assessment: blood return through all parts, free fluid flow, placement verified by x-ray and no pneumothorax on x-ray Patient tolerance: Patient tolerated the procedure well with no immediate complications.        MDM  No diagnosis found.  Pt comes in with cc of PAIN. Recent hip replacement. Pt not providing  and hx, reportedly, just started screaming  And appears in agony, thus sent to the ED. She appears really dry, otherwise no focal musculoskeletal acute findings. Concerns would be for fracture/dislocation - Xrays ordered, basic labs ordered. She has 0 SIRs on presentation.   3:17 AM I went to reassess patient. And she was noted to be hypotensive by me, BP 68/45. She also had shallow respirations. Pt has received dilaudid at  1:15 am and then ativan at 3:00 am. I suspect the respiration is from the Ativan 1 mg. Pt is DNR, called [name], and it goes to their messaging service. Unsure if she is in DNI as well. Currently we are bagging her, and her SATs are fine. WE will get ABG. BP manually is 70s/50s. Fluid bolus hanging. Lactate ordered, antibiotics ordered, cultures ordered.  Pt has acute renal failure. Likely all prerenal, as she appears extremely dry. Her blood gas had showed no profound acidosis. I had ordered sed rate and crp, as i was worried about possible hardware infection or septic joint -as the xray didn't show acute fracture -sed rate is elevated.  She has 2 iv in [name], might need a central line. Monitoring BP closely, might need pressors.  Likely will need CCM, Nephrology.    Derwood Kaplan, MD 03/03/13 0328  4:36 AM Pt now more alert. BP is improved > 90 SBP. Spoke with CCM - who had stated that if patient responds, there is nothing for they can offer, especially is she is DNR and not going to be on pressors. Will consult [facility]  Derwood Kaplan, MD 03/03/13 [phone]   CRITICAL CARE Performed by: Derwood Kaplan   Total critical care time: 60 minutes  Critical care time was exclusive of separately billable procedures and treating other patients.  Critical care was necessary to treat or prevent imminent or life-threatening deterioration.  Critical care was time spent personally by me on the following activities: development of treatment plan with  patient and/or surrogate as well as nursing, discussions with consultants, evaluation of patient's response to treatment, examination of patient, obtaining history from patient or surrogate, ordering and performing treatments and interventions, ordering and review of laboratory studies, ordering and review of radiographic studies, pulse oximetry and re-evaluation of patient's condition.     Derwood Kaplan, MD 03/03/13 [id number]  Derwood Kaplan, MD 03/03/13 [id number]

## 2013-03-03 NOTE — H&P (Addendum)
PULMONARY  / CRITICAL CARE MEDICINE  Name: Darlene [name] MRN: [id number] DOB: 05/05/1920    PCP [name], MD Cards -? Dr Weston Anna  ADMISSION DATE:  03/02/2013 CONSULTATION DATE: 03/02/2013 REFERRING MD :  Emergency room physician Dr  Rhunette Croft PRIMARY SERVICE: critical care  CHIEF COMPLAINT:  Acute delirium and renal failure status post hip fracture   SIGNIFICANT EVENTS / STUDIES:  03/02/2013-admission   LINES / TUBES: Right subclavian line 03/02/2013  CULTURES: Blood Urine   ANTIBIOTICS: Vancomycin Zosyn  HISTORY OF PRESENT ILLNESS:   History is obtained from the son, emergency room physician and some chart review  79year-old female who at baseline was living at home but was under the care of hospice on account of Crohn's disease and chronic systolic congestive heart failure and? Failure to thrive since July 2014 per the son. She also has chronic anemia secondary to AV malformations and chronic medical disease. Subsequently in early November 2014 fractured her left hip and is status post ORIF and was discharged for nursing home where apparently she was rehabilitating well but then developed acute delirium possibly due to severe left hip pain and was presented to the emergency room. In the emergency room she saw to be obtunded at the same time intermittently crying in pain (hip xray shows looseing of lef hip, also renal failure with opioids. physically dry on examination and thought to be in renak failure (on ace inhibitor) with hypotension despite 4 L of fluid. Subclavian central line was placed and critical care medicine has been asked to admit the patient. Son/family has elected for the patient to be no CODE BLUE but full medical care including pressors and central line.   PAST MEDICAL HISTORY :  Past Medical History  Diagnosis Date  . UTI (urinary tract infection)   . IBS (irritable bowel syndrome)   . Abdominal pain, left lower quadrant   . CAD (coronary artery  disease)   . Pneumonia   . HTN (hypertension)   . Hyperlipidemia   . Diverticulosis of colon   . History of colon cancer   . Allergic rhinitis   . Angina   . Cancer   . Arthritis   . Shortness of breath   . Anemia   . Blood transfusion   . Anxiety   . CHF (congestive heart failure)   . GERD (gastroesophageal reflux disease)   . Peripheral vascular disease    Past Surgical History  Procedure Laterality Date  . Hemicolectomy  1991    Right for mgt of colon cancer.  . Angioplasty  1989    Stent x 2  . Appendectomy    . Bilateral salpingoophorectomy    . Breast surgery      benign  . Orif hip fracture  12/2009    Dr Charlann Boxer  . Esophagogastroduodenoscopy  08/09/2011    Procedure: ESOPHAGOGASTRODUODENOSCOPY (EGD);  Surgeon: Hart Carwin, MD;  Location: [facility] ENDOSCOPY;  Service: Endoscopy;  Laterality: N/A;  . Colonoscopy  10/24/2011    Procedure: COLONOSCOPY;  Surgeon: Beverley Fiedler, MD;  Location[facility] ENDOSCOPY;  Service: Gastroenterology;  Laterality: N/A;  . Colon surgery    . Intramedullary (im) nail intertrochanteric Left 02/09/2013    Procedure: INTRAMEDULLARY (IM) NAIL INTERTROCHANTRIC;  Surgeon: Nadara Mustard, MD;  Location: [facility] OR;  Service: Orthopedics;  Laterality: Left;   Prior to Admission medications   Medication Sig Start Date End Date Taking? Authorizing Provider  Amino Acids-Protein Hydrolys (FEEDING SUPPLEMENT, PRO-STAT SUGAR FREE 64,) LIQD  Take 30 mLs by mouth daily.   Yes Historical Provider, MD  amLODipine (NORVASC) 10 MG tablet Take 1 tablet (10 mg total) by mouth daily. 07/19/12  Yes Jacques Navy, MD  aspirin EC 325 MG EC tablet Take 1 tablet (325 mg total) by mouth daily with breakfast. 02/12/13  Yes Jacques Navy, MD  atorvastatin (LIPITOR) 10 MG tablet Take 10 mg by mouth daily.   Yes Historical Provider, MD  feeding supplement, RESOURCE BREEZE, (RESOURCE BREEZE) LIQD Take 1 Container by mouth 2 (two) times daily between meals. 02/12/13  Yes Jacques Navy,  MD  furosemide (LASIX) 40 MG tablet TAKE 1 TABLET BY MOUTH EVERY DAY 02/13/13  Yes Jacques Navy, MD  HYDROcodone-acetaminophen (NORCO) 10-325 MG per tablet Take 1 tablet by mouth every 6 (six) hours as needed (pain).   Yes Historical Provider, MD  hydrocortisone cream (PREPARATION H HYDROCORTISONE) 1 % Apply 1 application topically 4 (four) times daily as needed (bleeding hemorrhoids).   Yes Historical Provider, MD  loratadine (CLARITIN) 10 MG tablet Take 1 tablet (10 mg total) by mouth daily. 01/04/13  Yes Jacques Navy, MD  LORazepam (ATIVAN) 0.5 MG tablet Take 0.5 mg by mouth 2 (two) times daily as needed for anxiety.  01/10/13  Yes Jacques Navy, MD  metoprolol succinate (TOPROL-XL) 100 MG 24 hr tablet Take 1 tablet (100 mg total) by mouth daily. 02/04/13  Yes Jacques Navy, MD  nitroGLYCERIN (NITROSTAT) 0.4 MG SL tablet Place 0.4 mg under the tongue every 5 (five) minutes as needed for chest pain.   Yes Historical Provider, MD  omeprazole (PRILOSEC) 20 MG capsule Take 20 mg by mouth daily.   Yes Historical Provider, MD  PRESCRIPTION MEDICATION Apply 1 mL topically 1 day or 1 dose. Ativan Gel  0.25mg /23ml   Yes Historical Provider, MD  ramipril (ALTACE) 10 MG tablet Take 1 tablet (10 mg total) by mouth daily. 07/19/12  Yes Jacques Navy, MD  rOPINIRole (REQUIP) 0.5 MG tablet Take 0.5 mg by mouth at bedtime.   Yes Historical Provider, MD  senna-docusate (SENOKOT-S) 8.6-50 MG per tablet Take 2 tablets by mouth at bedtime. 10/13/12  Yes Jacques Navy, MD  sertraline (ZOLOFT) 50 MG tablet Take 50 mg by mouth daily.   Yes Historical Provider, MD  terazosin (HYTRIN) 1 MG capsule Take 1 capsule (1 mg total) by mouth at bedtime. 07/19/12  Yes Jacques Navy, MD  ciprofloxacin (CIPRO) 250 MG tablet Take 250 mg by mouth 2 (two) times daily. 02/23/13 03/03/13  Historical Provider, MD  ezetimibe (ZETIA) 10 MG tablet Take 10 mg by mouth at bedtime.    Historical Provider, MD   HYDROcodone-acetaminophen (NORCO/VICODIN) 5-325 MG per tablet Take one tablet by mouth every 6 hours as needed for pain 02/25/13   Sharee Holster, NP  morphine (ROXANOL) 20 MG/ML concentrated solution Take 0.25 ml = 5 mg by mouth every four hours  *DOUBLE CHECK DOSE. USE CALIBRATED SYRINGE* 03/01/13   Claudie Revering, NP  Multiple Vitamin (MULTIVITAMIN WITH MINERALS) TABS tablet Take 1 tablet by mouth daily. Centrum Silver    Historical Provider, MD  pravastatin (PRAVACHOL) 80 MG tablet Take 80 mg by mouth at bedtime.    Historical Provider, MD   Allergies  Allergen Reactions  . Antihistamines, Chlorpheniramine-Type Other (See Comments)    unknown  . Chocolate Other (See Comments)    Pt states "makes her sick" and she starts coughing    FAMILY HISTORY:  Family History  Problem Relation Age of Onset  . Colon cancer Neg Hx   . Breast cancer Neg Hx    SOCIAL HISTORY:  reports that she has never smoked. She has never used smokeless tobacco. She reports that she does not drink alcohol or use illicit drugs.  REVIEW OF SYSTEMS:  Per HPI  SUBJECTIVE:   VITAL SIGNS: Temp:  [97.8 F (36.6 C)-99.1 F (37.3 C)] 97.8 F (36.6 C) (11/23 0100) Pulse Rate:  [78-90] 84 (11/23 0800) Resp:  [18-22] 19 (11/23 0800) BP: (81-110)/(29-60) 81/43 mmHg (11/23 0800) SpO2:  [93 %-100 %] 100 % (11/23 0800) HEMODYNAMICS:   VENTILATOR SETTINGS:   INTAKE / OUTPUT: Intake/Output   None     PHYSICAL EXAMINATION: General:  Frail elderly female lying in the stretcher in the emergency room. Looks critically ill Neuro:  Status post Ativan. RASS -3. occasionally groaning HEENT:  Mouth looks extremely dry. Neck is supple pupils equal reactive to light Cardiovascular:  Tachycardic, hypotensive and murmur heard Lungs:  Shallow respirations, mild paradoxical nonspecific crackles Abdomen:  Soft, nontender no rigidity Musculoskeletal:  No cyanosis, no clubbing no edema Skin:  Intact at least in  front LABS:  CBC  Recent Labs Lab 03/03/13 0005  WBC 15.8*  HGB 8.6*  HCT 26.3*  PLT 296   Coag's No results found for this basename: APTT, INR,  in the last 168 hours BMET  Recent Labs Lab 03/03/13 0005  NA 126*  K 5.9*  CL 83*  CO2 22  BUN 89*  CREATININE 6.77*  GLUCOSE 111*   Electrolytes  Recent Labs Lab 03/03/13 0005  CALCIUM 9.0   Sepsis Markers  Recent Labs Lab 03/03/13 0346  LATICACIDVEN 1.18   ABG  Recent Labs Lab 03/03/13 0320  PHART 7.345*  PCO2ART 37.7  PO2ART 74.8*   Liver Enzymes No results found for this basename: AST, ALT, ALKPHOS, BILITOT, ALBUMIN,  in the last 168 hours Cardiac Enzymes  Recent Labs Lab 03/03/13 0005  TROPONINI <0.30   Glucose No results found for this basename: GLUCAP,  in the last 168 hours  Imaging Ct Head Wo Contrast  03/03/2013   CLINICAL DATA:  Stroke  EXAM: CT HEAD WITHOUT CONTRAST  TECHNIQUE: Contiguous axial images were obtained from the base of the skull through the vertex without intravenous contrast.  COMPARISON:  Prior CT from 02/08/2013  FINDINGS: Atrophy with chronic microvascular ischemic changes are stable as compared to the prior exam.  There is no acute intracranial hemorrhage or infarct. No mass lesion or midline shift. Gray-white matter differentiation is well maintained. Ventricles are normal in size without evidence of hydrocephalus. CSF containing spaces are within normal limits. No extra-axial fluid collection.  The calvarium is intact.  Orbital soft tissues are within normal limits.  The paranasal sinuses and mastoid air cells are well pneumatized and free of fluid.  Scalp soft tissues are unremarkable.  IMPRESSION: 1. No acute intracranial infarct or other process identified. 2. Stable atrophy and chronic microvascular ischemic changes.   Electronically Signed   By: Rise Mu M.D.   On: 03/03/2013 03:32   Dg Pelvis Portable  03/03/2013   CLINICAL DATA:  Left hip fracture   EXAM: PORTABLE PELVIS 1-2 VIEWS  COMPARISON:  February 08, 2013  FINDINGS: There is right femoral hemiarthroplasty in good position. The patient has comminuted marked displaced fracture intertrochanteric left femur which was seen on prior x-ray. This has been fixated with a left femoral rod. However, the proximal component of  the left femoral rod projects beyond the left femoral neck. There is lucency surrounding the rod at the lateral intertrochanteric region suggesting loosening.  IMPRESSION: The patient has comminuted marked displaced fracture intertrochanteric left femur which was seen on prior x-ray. This has been fixated with a left femoral rod. However, the proximal component of the left femoral rod projects beyond the left femoral neck. There is lucency surrounding the rod at the lateral intertrochanteric region suggesting loosening.   Electronically Signed   By: Sherian Rein M.D.   On: 03/03/2013 01:18   Dg Chest Portable 1 View  03/03/2013   CLINICAL DATA:  Left hip fracture  EXAM: PORTABLE CHEST - 1 VIEW  COMPARISON:  February 08, 2013  FINDINGS: The heart size and mediastinal contours are stable. The aorta is tortuous. The heart size is enlarged. Both lungs are clear. There is no focal infiltrate, pulmonary edema, or pleural effusion. The visualized skeletal structures are stable.  IMPRESSION: No active cardiopulmonary disease.   Electronically Signed   By: Sherian Rein M.D.   On: 03/03/2013 01:13       ASSESSMENT / PLAN:  PULMONARY A:In resp distress due to metabolic issues P:   DNI Pall care consult for symptom mgmt  CARDIOVASCULAR A: Basline chronic systolic CHF  Hypoteisve and in circulatorshock.? Due to dehydration v sepsis P:  EGDT  RENAL A:  Acute renal failure with hyponatremia and kyperkalmia P:   urie lytes Urine osm EGDT Place foley Renal US  GASTROINTESTINAL A:  chrons disease per son and hx of avm per record P:   monitor  HEMATOLOGIC A:  Anemia of  chronic disease  P:  PRBC for hg < 7gm% onlyunless atively bleeding  INFECTIOUS A:  Possible septic shock.Atrisk for HCAP bacteremia from hip P:   Vanc, zosyn,  EGDT PCT algo  ENDOCRINE A:  At risk for secondary adrenal insuff   P:   Check cortisol hydrocort  NEUROLOGIC A:  Acute delirium due to pain, renal failure, and opioids in setting of renal failure P:   Hydrate Palliative care for concomitant symptom mgmt Fentanyl prn for pain in setttnig of renal failure   GLOBAL 11/222/14: son updated on phone. He has no car to travel. Full medical care but DNR/DNI if arrests. He is ok with concurrent palliative care   Critical Care Time devoted to patient care services described in this note is  45  Minutes.  Dr. Kalman Shan, M.D., [facility].C.P Pulmonary and Critical Care Medicine Staff Physician [facility] Pager: [phone], If no answer or between  15:00h - 7:00h: call [phone]  03/03/2013 8:22 AM

## 2013-03-03 NOTE — Progress Notes (Signed)
eLink Physician-Brief Progress Note Patient Name: Darlene [name] DOB: [birth year] MRN: [id number]  Date of Service  03/03/2013   HPI/Events of Note   Septic shock  eICU Interventions  CVp low -ct fluids Pressors to maintain MAP goal Co-ox low, but aim for volume resuscitation   Intervention Category Major Interventions: Sepsis - evaluation and management  ALVA,RAKESH V. 03/03/2013, 3:30 PM

## 2013-03-03 NOTE — Progress Notes (Addendum)
Consulted by ER for admission patient is moaning, unresponsive,  SBP 80's/ 40. Pelvic films showing Loosening of the screw. Orthopedics have been called. Spoke to family who states patient was very functional prior to hip fracture. They would like aggressive measures including surgery if needed and Limited code. Family stated they wish for her to have procedures, IV Lines and pressors if needed but not Intubation or CPR. Son's name Zachery Dauer number [phone].  ER MD to call PCCM for further management given that patient is currently unstable and family wishes for further treatment.   Erubiel Manasco 6:33 AM

## 2013-03-03 NOTE — Progress Notes (Signed)
eLink Physician-Brief Progress Note Patient Name: Darlene [name] DOB: 11-17-1920 MRN: [id number]  Date of Service  03/03/2013   HPI/Events of Note     eICU Interventions  Haldol prn agitation   Intervention Category Major Interventions: Delirium, psychosis, severe agitation - evaluation and management  ALVA,RAKESH V. 03/03/2013, 5:56 PM

## 2013-03-03 NOTE — Progress Notes (Signed)
ANTIBIOTIC CONSULT NOTE - INITIAL  Pharmacy Consult for Vancomycin and Zosyn Indication: Septic Shock  Allergies  Allergen Reactions  . Antihistamines, Chlorpheniramine-Type Other (See Comments)    unknown  . Chocolate Other (See Comments)    Pt states "makes her sick" and she starts coughing    Patient Measurements: Weight: 114 lb 10.2 oz (52 kg) Height: 65 inches  Vital Signs: Temp: 97.8 F (36.6 C) (11/23 0100) Temp src: Rectal (11/22 2356) BP: 81/43 mmHg (11/23 0800) Pulse Rate: 84 (11/23 0800) Intake/Output from previous day:   Intake/Output from this shift:    Labs:  Recent Labs  03/03/13 0005  WBC 15.8*  HGB 8.6*  PLT 296  CREATININE 6.77*   CrCl < 10 ml/min No results found for this basename: VANCOTROUGH, VANCOPEAK, VANCORANDOM, GENTTROUGH, GENTPEAK, GENTRANDOM, TOBRATROUGH, TOBRAPEAK, TOBRARND, AMIKACINPEAK, AMIKACINTROU, AMIKACIN,  in the last 72 hours  Microbiology: Recent Results (from the past 720 hour(s))  SURGICAL PCR SCREEN     Status: None   Collection Time    02/09/13  2:48 AM      Result Value Range Status   MRSA, PCR NEGATIVE  NEGATIVE Final   Staphylococcus aureus NEGATIVE  NEGATIVE Final   Comment:            The Xpert SA Assay (FDA     approved for NASAL specimens     in patients over 55 years of age),     is one component of     a comprehensive surveillance     program.  Test performance has     been validated by [employer] for patients greater     than or equal to 47 year old.     It is not intended     to diagnose infection nor to     guide or monitor treatment.   Blood x2: Urine:  Medical History: Past Medical History  Diagnosis Date  . UTI (urinary tract infection)   . IBS (irritable bowel syndrome)   . Abdominal pain, left lower quadrant   . CAD (coronary artery disease)   . Pneumonia   . HTN (hypertension)   . Hyperlipidemia   . Diverticulosis of colon   . History of colon cancer   . Allergic rhinitis    . Angina   . Cancer   . Arthritis   . Shortness of breath   . Anemia   . Blood transfusion   . Anxiety   . CHF (congestive heart failure)   . GERD (gastroesophageal reflux disease)   . Peripheral vascular disease     Medications:  Scheduled:  . heparin  5,000 Units Subcutaneous Q12H  . pantoprazole (PROTONIX) IV  40 mg Intravenous QHS   Infusions:  . sodium chloride    . DOBUTamine    . norepinephrine (LEVOPHED) Adult infusion    . phenylephrine (NEO-SYNEPHRINE) Adult infusion    . sodium chloride    . vasopressin (PITRESSIN) infusion - *FOR SHOCK*     Assessment: 79 yo female admitted today with acute delirium and renal failure status post hip fracture s/p ORIF in  November 2014. She was discharged to nursing home where apparently she was rehabilitating well but then developed acute delirium.   Vancomycin 1g and Unasyn 3g given in ED 6am  ARF with SCr 6.77 (1.29 on 02/11/13) with CrCl < 10 ml/min  WBC elevated at 15.8k  Blood and urine cultures pending  Patient is DNI under hospice care  Vasopressors started  Goal of Therapy:  Vancomycin trough level 15-20 mcg/ml Zosyn dose per renal function  Plan:   Vancomycin 1g IV given in ER ~6am - given ARF, will check random vancomycin level in ~48hr to determine when to redose  Zosyn 2.25g IV q8h (30 minute infusions) Follow up renal function & cultures  Loralee Pacas, PharmD, BCPS Pager: [phone] 03/03/2013,8:44 AM

## 2013-03-03 NOTE — Progress Notes (Signed)
CRITICAL VALUE ALERT  Critical value received: Hbg 6.7  Date of notification: 03/03/2013  Time of notification:  0945  Critical value read back:yes  Nurse who received alert: Lorrin Jackson RN  MD notified (1st page):  Dr. Colletta Maryland  Time of first page:  0950  MD notified (2nd page):n/a  Time of second page:n/a  Responding MD: Dr. Colletta Maryland  Time MD responded: [phone]

## 2013-03-03 NOTE — Progress Notes (Signed)
eLink Physician-Brief Progress Note Patient Name: Darlene [name] DOB: 1921/01/10 MRN: [id number]  Date of Service  03/03/2013   HPI/Events of Note     eICU Interventions  Based on available information, pain relief seems to be the most importantn goal of care atthis time. 100 mcg fentanyl does not seem to be enough - requiring dilaudid for breakthrough Will also add IV tylenol   Intervention Category Intermediate Interventions: Pain - evaluation and management  Kinsie Belford V. 03/03/2013, 9:09 PM

## 2013-03-03 NOTE — Progress Notes (Signed)
Palliative Medicine Team  Called an spoke with patient's son Ethelene Browns by phone for guidance regarding goals of care and how to proceed with caring for his mother. Oneita Kras told me on the phone that he is extremely ill, that he does not have transportation, he has cirrhosis, long time issues with alcoholism, at one point in our conversation he told me he thought he was "having a heart attack". He said he cannot think straight- when I asked him about his mother's care and condition all he could say is "I dont know". I did not find him to be a reliable decision maker over the phone I encouraged him to call 911 if he was ill of having chest pain. He told me he was ok and going to go lie down. I suspect that he has significant mental health issues and extreme anxiety. He tells me it is just he and his mother and it has been that way for years. He says he cannot take losing her and will probably die himself of his health problems. I offered to call an ambulance for him but he refused. He denies any suicidal ideation. In terms of his mother's care I recommed continuing current level of care in addition to comfort care. Unfortunately the patient is sick enough that she may not survive even with aggressive interventions. I am not sure if she is an active Hospice patient or not-she was apparently at [facility] for rehab-will determine her status in AM and review her Hospice records for prior goals of care.  Anderson Malta, DO Palliative Medicine

## 2013-03-03 NOTE — ED Notes (Signed)
Pt continues to moan in pain and tremble,  Comfort measures given

## 2013-03-03 NOTE — ED Notes (Signed)
MD at bedside inserting central line.

## 2013-03-04 ENCOUNTER — Inpatient Hospital Stay (HOSPITAL_COMMUNITY): Payer: Medicare Other

## 2013-03-04 DIAGNOSIS — F341 Dysthymic disorder: Secondary | ICD-10-CM

## 2013-03-04 DIAGNOSIS — D649 Anemia, unspecified: Secondary | ICD-10-CM

## 2013-03-04 LAB — CBC WITH DIFFERENTIAL/PLATELET
Basophils Absolute: 0 10*3/uL (ref 0.0–0.1)
HCT: 26.7 % — ABNORMAL LOW (ref 36.0–46.0)
Lymphocytes Relative: 7 % — ABNORMAL LOW (ref 12–46)
Lymphs Abs: 0.9 10*3/uL (ref 0.7–4.0)
Monocytes Absolute: 0.7 10*3/uL (ref 0.1–1.0)
Neutro Abs: 11.6 10*3/uL — ABNORMAL HIGH (ref 1.7–7.7)
Platelets: 220 10*3/uL (ref 150–400)
RBC: 2.96 MIL/uL — ABNORMAL LOW (ref 3.87–5.11)
RDW: 20.2 % — ABNORMAL HIGH (ref 11.5–15.5)
WBC: 13.2 10*3/uL — ABNORMAL HIGH (ref 4.0–10.5)

## 2013-03-04 LAB — URINE CULTURE
Colony Count: NO GROWTH
Culture: NO GROWTH
Culture: NO GROWTH

## 2013-03-04 LAB — BASIC METABOLIC PANEL
BUN: 55 mg/dL — ABNORMAL HIGH (ref 6–23)
Chloride: 105 mEq/L (ref 96–112)
GFR calc non Af Amer: 15 mL/min — ABNORMAL LOW (ref >90)
Glucose, Bld: 163 mg/dL — ABNORMAL HIGH (ref 70–99)
Potassium: 4.3 mEq/L (ref 3.5–5.1)
Sodium: 137 mEq/L (ref 135–145)

## 2013-03-04 LAB — CBC
HCT: 26.6 % — ABNORMAL LOW (ref 36.0–46.0)
Hemoglobin: 8.5 g/dL — ABNORMAL LOW (ref 12.0–15.0)
MCHC: 32 g/dL (ref 30.0–36.0)
Platelets: 199 10*3/uL (ref 150–400)
RBC: 2.95 MIL/uL — ABNORMAL LOW (ref 3.87–5.11)
WBC: 11.2 10*3/uL — ABNORMAL HIGH (ref 4.0–10.5)

## 2013-03-04 LAB — LEGIONELLA ANTIGEN, URINE: Legionella Antigen, Urine: NEGATIVE

## 2013-03-04 LAB — PHOSPHORUS: Phosphorus: 5.1 mg/dL — ABNORMAL HIGH (ref 2.3–4.6)

## 2013-03-04 LAB — PROCALCITONIN: Procalcitonin: 1.06 ng/mL

## 2013-03-04 MED ORDER — HYDROCORTISONE SOD SUCCINATE 100 MG IJ SOLR
50.0000 mg | Freq: Four times a day (QID) | INTRAMUSCULAR | Status: DC
  Administered 2013-03-04 – 2013-03-05 (×5): 50 mg via INTRAVENOUS
  Filled 2013-03-04: qty 2
  Filled 2013-03-04 (×3): qty 1
  Filled 2013-03-04 (×3): qty 2
  Filled 2013-03-04: qty 1

## 2013-03-04 MED ORDER — BIOTENE DRY MOUTH MT LIQD
15.0000 mL | Freq: Four times a day (QID) | OROMUCOSAL | Status: DC
  Administered 2013-03-05 – 2013-03-06 (×7): 15 mL via OROMUCOSAL

## 2013-03-04 MED ORDER — HYDROMORPHONE HCL PF 1 MG/ML IJ SOLN
INTRAMUSCULAR | Status: AC
  Administered 2013-03-04: 0.5 mg
  Filled 2013-03-04: qty 1

## 2013-03-04 MED ORDER — CHLORHEXIDINE GLUCONATE 0.12 % MT SOLN
15.0000 mL | Freq: Two times a day (BID) | OROMUCOSAL | Status: DC
  Administered 2013-03-05 – 2013-03-07 (×4): 15 mL via OROMUCOSAL
  Filled 2013-03-04 (×7): qty 15

## 2013-03-04 MED ORDER — BIOTENE DRY MOUTH MT LIQD: 15.0000 mL | Freq: Four times a day (QID) | OROMUCOSAL | Status: DC

## 2013-03-04 MED ORDER — HYDROMORPHONE HCL PF 1 MG/ML IJ SOLN
0.5000 mg | Freq: Once | INTRAMUSCULAR | Status: AC
  Administered 2013-03-04: 0.5 mg via INTRAVENOUS

## 2013-03-04 NOTE — Progress Notes (Signed)
03/04/13 1700  Clinical Encounter Type  Visited With Patient  Visit Type Initial  Referral From Nurse   Made brief visit with Ms Ramthun to offer support and encouragement per RN referral.  Visit interrupted by need for swallow study.  Will refer to tomorrow's chaplain for follow-up support, but please also page as needed:  [phone].  Thank you.  [address] [place], [place] [phone]

## 2013-03-04 NOTE — Progress Notes (Signed)
INITIAL NUTRITION ASSESSMENT  DOCUMENTATION CODES Per approved criteria  -Not Applicable   INTERVENTION: Diet advancement per MD/SLP discretion Palliative Goals of Care RD to monitor nutrition care plan and provide interventions as needed  NUTRITION DIAGNOSIS: Predicted suboptimal nutrient intake related to acute delirium as evidenced by pt's chart.   Goal: Pt to meet >/= 90% of their estimated nutrition needs   Monitor:  Diet advancement PO intake Weight Labs  Reason for Assessment: Malnutrition Screening Tool, score of 2  79 y.o. female  Admitting Dx: <principal problem not specified>  ASSESSMENT: 79 y.o. female with painful left hip S/P closed reduction and IF with IMHS on 02/09/13. Sent to ER from [facility] facility with delirium and "screaming in pain".Has hx CHF,renal failure and acute encephalopathy and presently admitted with hypotension and anemia.No hx recent fall post op. Films in ER note failure of fixation left hip with loss of reduction and screw penetrating through femoral head abutting acetabulum.  Pt unresponsive at time of visit. Per RN pt has dementia and is not able to answer questions. Per MST report, pt reported weight loss but, weight history shows weight has been stable for the past 3 months. Per RN, pt is NPO until SLP evaluation is done.   Height: Ht Readings from Last 1 Encounters:  03/03/13 5' 4.96" (1.65 m)    Weight: Wt Readings from Last 1 Encounters:  03/04/13 116 lb 13.5 oz (53 kg)    Ideal Body Weight: 125 lbs  % Ideal Body Weight: 93%  Wt Readings from Last 10 Encounters:  03/04/13 116 lb 13.5 oz (53 kg)  02/08/13 114 lb (51.71 kg)  02/08/13 114 lb (51.71 kg)  01/15/13 114 lb 10.2 oz (52 kg)  11/13/12 113 lb (51.256 kg)  10/12/12 121 lb 7.6 oz (55.1 kg)  07/16/12 122 lb (55.339 kg)  05/25/12 105 lb (47.628 kg)  11/23/11 115 lb (52.164 kg)  11/03/11 113 lb 14.4 oz (51.665 kg)    Usual Body Weight: 115 lbs  % Usual  Body Weight: 100%  BMI:  Body mass index is 19.47 kg/(m^2).  Estimated Nutritional Needs: Kcal: 1300-1500 Protein: 60-70 grams Fluid: 1.3-1.5 L/day  Skin: purple blanchable area to sacrum, incision on left hip  Diet Order:    EDUCATION NEEDS: -No education needs identified at this time   Intake/Output Summary (Last 24 hours) at 03/04/13 1301 Last data filed at 03/04/13 1115  Gross per 24 hour  Intake 3660.05 ml  Output   2370 ml  Net 1290.05 ml    Last BM: PTA   Labs:   Recent Labs Lab 03/03/13 0005 03/03/13 0850 03/04/13 0345  NA 126* 136 137  K 5.9* 4.4 4.3  CL 83* 104 105  CO2 22 19 19   BUN 89* 73* 55*  CREATININE 6.77* 4.83* 2.57*  CALCIUM 9.0 7.4* 8.4  MG  --  2.1 1.9  PHOS  --  6.5* 5.1*  GLUCOSE 111* 99 163*    CBG (last 3)  No results found for this basename: GLUCAP,  in the last 72 hours  Scheduled Meds: . acetaminophen  1,000 mg Intravenous Q6H  . heparin  5,000 Units Subcutaneous Q12H  . hydrocortisone sodium succinate  50 mg Intravenous Q6H  . HYDROmorphone      . pantoprazole (PROTONIX) IV  40 mg Intravenous QHS  . piperacillin-tazobactam (ZOSYN)  IV  2.25 g Intravenous Q8H    Continuous Infusions: . sodium chloride 50 mL/hr at 03/04/13 0414  . norepinephrine (LEVOPHED)  Adult infusion Stopped (03/04/13 1115)    Past Medical History  Diagnosis Date  . UTI (urinary tract infection)   . IBS (irritable bowel syndrome)   . Abdominal pain, left lower quadrant   . CAD (coronary artery disease)   . Pneumonia   . HTN (hypertension)   . Hyperlipidemia   . Diverticulosis of colon   . History of colon cancer   . Allergic rhinitis   . Angina   . Cancer   . Arthritis   . Shortness of breath   . Anemia   . Blood transfusion   . Anxiety   . CHF (congestive heart failure)   . GERD (gastroesophageal reflux disease)   . Peripheral vascular disease     Past Surgical History  Procedure Laterality Date  . Hemicolectomy  1991    Right  for mgt of colon cancer.  . Angioplasty  1989    Stent x 2  . Appendectomy    . Bilateral salpingoophorectomy    . Breast surgery      benign  . Orif hip fracture  12/2009    Dr Charlann Boxer  . Esophagogastroduodenoscopy  08/09/2011    Procedure: ESOPHAGOGASTRODUODENOSCOPY (EGD);  Surgeon: Hart Carwin, MD;  Location: [facility] ENDOSCOPY;  Service: Endoscopy;  Laterality: N/A;  . Colonoscopy  10/24/2011    Procedure: COLONOSCOPY;  Surgeon: Beverley Fiedler, MD;  Location: [facility] ENDOSCOPY;  Service: Gastroenterology;  Laterality: N/A;  . Colon surgery    . Intramedullary (im) nail intertrochanteric Left 02/09/2013    Procedure: INTRAMEDULLARY (IM) NAIL INTERTROCHANTRIC;  Surgeon: Nadara Mustard, MD;  Location: [facility] OR;  Service: Orthopedics;  Laterality: Left;    Ian Malkin RD, LDN Inpatient Clinical Dietitian Pager: [phone] After Hours Pager: [phone]

## 2013-03-04 NOTE — Progress Notes (Signed)
eLink Physician-Brief Progress Note Patient Name: Darlene [name] DOB: June 28, 1920 MRN: [id number]  Date of Service  03/04/2013   HPI/Events of Note   Patient requests diet  eICU Interventions   Soft diet   Intervention Category Minor Interventions: Communication with other healthcare providers and/or family  Max Fickle 03/04/2013, 5:38 PM

## 2013-03-04 NOTE — Evaluation (Signed)
SLP Cancellation Note  Patient Details Name: Darlene [name] MRN: [id number] DOB: Mar 05, 1921   Cancelled treatment:       Reason Eval/Treat Not Completed: Pain limiting ability to participate (pt hollering out in pain constantly, will reattempt later)   Donavan Burnet, MS [facility] SLP [phone]

## 2013-03-04 NOTE — Evaluation (Signed)
Clinical/Bedside Swallow Evaluation Patient Details  Name: Darlene [name] MRN: [id number] Date of Birth: Oct 29, 1920  Today's Date: 03/04/2013 Time: 4098-1191 SLP Time Calculation (min): 32 min  Past Medical History:  Past Medical History  Diagnosis Date  . UTI (urinary tract infection)   . IBS (irritable bowel syndrome)   . Abdominal pain, left lower quadrant   . CAD (coronary artery disease)   . Pneumonia   . HTN (hypertension)   . Hyperlipidemia   . Diverticulosis of colon   . History of colon cancer   . Allergic rhinitis   . Angina   . Cancer   . Arthritis   . Shortness of breath   . Anemia   . Blood transfusion   . Anxiety   . CHF (congestive heart failure)   . GERD (gastroesophageal reflux disease)   . Peripheral vascular disease    Past Surgical History:  Past Surgical History  Procedure Laterality Date  . Hemicolectomy  1991    Right for mgt of colon cancer.  . Angioplasty  1989    Stent x 2  . Appendectomy    . Bilateral salpingoophorectomy    . Breast surgery      benign  . Orif hip fracture  12/2009    Dr Charlann Boxer  . Esophagogastroduodenoscopy  08/09/2011    Procedure: ESOPHAGOGASTRODUODENOSCOPY (EGD);  Surgeon: Hart Carwin, MD;  Location: [facility] ENDOSCOPY;  Service: Endoscopy;  Laterality: N/A;  . Colonoscopy  10/24/2011    Procedure: COLONOSCOPY;  Surgeon: Beverley Fiedler, MD;  Location: [facility] ENDOSCOPY;  Service: Gastroenterology;  Laterality: N/A;  . Colon surgery    . Intramedullary (im) nail intertrochanteric Left 02/09/2013    Procedure: INTRAMEDULLARY (IM) NAIL INTERTROCHANTRIC;  Surgeon: Nadara Mustard, MD;  Location: [facility] OR;  Service: Orthopedics;  Laterality: Left;   HPI:  Pt is 79 yo female recently diagnosed with left hip fx s/p surgery in early November, now admitted with shock, pt was undergoing rehab at [facility] since recent hospital dc.   Pt is a followed by hospice.  SLP observed pt to raise her voice while moaning - without clear indication of  specific need.  SLP swallow evaluation was ordered.  CXR indicated right upper lobe opacity worse, ? bilateral ATX vs infiltrate.  Pt is afebrile without baseline shortness of breath, but does become dyspenic easily.       Assessment / Plan / Recommendation Clinical Impression  Pt did not demonstrate any overt s/s of aspiration at bedside but was observed to become mildly dyspneic with small amount of intake (RR up to 29).   No focal CN deficits impacting swallow apparent.  Mildly prolonged oral phase with solid with mild oral stasis cleared with applesauce and liquid intake.   Pt takes very small bites (1/4-1/2 tsp applesauce) and when SLP inquired to why, pt stated "I was taught not to be hoggy".    H/o esophageal issues (GERD) noted and she was taking a PPI prior to admission.  Risk of aspiration is mild if pt fed when fully alert and not short of breathe.  Rec soft/ground meats due to decreased oral skills to improve swallow efficiency/comfort.  SLP to follow up once to assure tolerance and for family/pt education given findings of CXR and respiratory status.  Hopeful for swallow to improve to baseline as pt medically improves.      Aspiration Risk  Mild    Diet Recommendation Thin liquid;Dysphagia 3 (Mechanical Soft) (ground meats)  Liquid Administration via: Straw;Cup Medication Administration: Whole meds with liquid Supervision: Full supervision/cueing for compensatory strategies;Staff to assist with self feeding Compensations: Slow rate;Follow solids with liquid;Check for pocketing (use applesauce to aid oral clearance as needed) Postural Changes and/or Swallow Maneuvers: Seated upright 90 degrees;Upright 30-60 min after meal    Other  Recommendations Oral Care Recommendations: Oral care BID   Follow Up Recommendations       Frequency and Duration min 1 x/week  1 week   Pertinent Vitals/Pain Afebrile, decreased    SLP Swallow Goals     Swallow Study Prior Functional Status    see hhx section    General HPI: Pt is 79 yo female recently diagnosed with left hip fx s/p surgery in early November, now admitted with shock, pt was undergoing rehab at [facility] since recent hospital dc.   Pt is a followed by hospice.  SLP observed pt to raise her voice while moaning - without clear indication of specific need.  SLP swallow evaluation was ordered.  CXR indicated right upper lobe opacity worse, ? bilateral ATX vs infiltrate.  Pt is afebrile without baseline shortness of breath, but does become dyspenic easily.     Type of Study: Bedside swallow evaluation Diet Prior to this Study: NPO Temperature Spikes Noted: No Respiratory Status: Nasal cannula Behavior/Cognition: Alert;Cooperative;Pleasant mood;Distractible (internally distracted by pain) Oral Cavity - Dentition: Adequate natural dentition Self-Feeding Abilities: Needs assist Patient Positioning: Upright in bed Baseline Vocal Quality: Clear Volitional Cough: Cognitively unable to elicit Volitional Swallow: Unable to elicit    Oral/Motor/Sensory Function Overall Oral Motor/Sensory Function:  (generalized weakness, no focal CN deficits impacting swallow musculature)   Ice Chips Ice chips: Not tested   Thin Liquid Thin Liquid: Within functional limits Presentation: Straw;Cup;Self Fed Other Comments: mildly increased work of breathing noted with intake    Nectar Thick Nectar Thick Liquid: Not tested   Honey Thick Honey Thick Liquid: Not tested   Puree Puree: Within functional limits Presentation: Self Fed;Spoon   Solid   GO    Solid: Impaired Oral Phase Impairments: Impaired anterior to posterior transit;Reduced lingual movement/coordination Other Comments: several respiratory cycles completed while pt masticating cracker, increases asp risk with solids, applesauce and/or liquid consumption facilitated oral clearance       Donavan Burnet, MS [facility] SLP [phone]

## 2013-03-04 NOTE — H&P (Signed)
PULMONARY  / CRITICAL CARE MEDICINE  Name: Darlene [name] MRN: [id number] DOB: 1921-02-17    PCP [name], MD Cards -? Dr Weston Anna  ADMISSION DATE:  03/02/2013 CONSULTATION DATE: 03/02/2013 REFERRING MD :  Emergency room physician Dr  Rhunette Croft PRIMARY SERVICE: critical care  CHIEF COMPLAINT:  Acute delirium and renal failure status post hip fracture, shock  SIGNIFICANT EVENTS / STUDIES:  03/02/2013-admission  11/23- renal US>>>no hydro 11/24- remain son pressors  LINES / TUBES: Right subclavian line 03/02/2013>>>  CULTURES: 11/23 cdiff>>>neg 11/22 Blood 11/22 Urine  ANTIBIOTICS: 11/22 Vancomycin>>>11/24 11/22 Zosyn>>>  SUBJECTIVE: remains on pressors  VITAL SIGNS: Temp:  [97.4 F (36.3 C)-98.8 F (37.1 C)] 98.1 F (36.7 C) (11/24 0800) Pulse Rate:  [101-126] 115 (11/24 0230) Resp:  [10-30] 19 (11/24 0800) BP: (79-127)/(23-94) 121/55 mmHg (11/24 0800) SpO2:  [92 %-100 %] 92 % (11/24 0630) Weight:  [53 kg (116 lb 13.5 oz)] 53 kg (116 lb 13.5 oz) (11/24 0700) HEMODYNAMICS: CVP:  [2 mmHg-10 mmHg] 10 mmHg VENTILATOR SETTINGS:   INTAKE / OUTPUT: Intake/Output     11/23 0701 - 11/24 0700 11/24 0701 - 11/25 0700   I.V. (mL/kg) 1750.6 (33)    Blood 362    IV Piggyback 1300    Total Intake(mL/kg) 3412.6 (64.4)    Urine (mL/kg/hr) 3065 (2.4)    Total Output 3065     Net +347.6            PHYSICAL EXAMINATION: General:  ill Neuro:  confused HEENT: jvd flat  Cardiovascular:  s1 s2 rrt Lungs:  reduced Abdomen:  Soft, nontender no rigidity Musculoskeletal:  No edema Skin:  Intact at least in front LABS:  CBC  Recent Labs Lab 03/03/13 0850 03/03/13 1515 03/04/13 0345  WBC 9.1 13.1* 11.2*  HGB 6.7* 8.7* 8.5*  HCT 20.9* 26.7* 26.6*  PLT 170 183 199   Coag's  Recent Labs Lab 03/03/13 0850  APTT 29  INR 1.45   BMET  Recent Labs Lab 03/03/13 0005 03/03/13 0850 03/04/13 0345  NA 126* 136 137  K 5.9* 4.4 4.3  CL 83* 104 105  CO2  22 19 19   BUN 89* 73* 55*  CREATININE 6.77* 4.83* 2.57*  GLUCOSE 111* 99 163*   Electrolytes  Recent Labs Lab 03/03/13 0005 03/03/13 0850 03/04/13 0345  CALCIUM 9.0 7.4* 8.4  MG  --  2.1 1.9  PHOS  --  6.5* 5.1*   Sepsis Markers  Recent Labs Lab 03/03/13 0346 03/03/13 0850 03/04/13 0345  LATICACIDVEN 1.18 0.6  --   PROCALCITON  --  1.53 1.06   ABG  Recent Labs Lab 03/03/13 0320  PHART 7.345*  PCO2ART 37.7  PO2ART 74.8*   Liver Enzymes  Recent Labs Lab 03/03/13 0850  AST 57*  ALT 18  ALKPHOS 117  BILITOT 0.3  ALBUMIN 2.1*   Cardiac Enzymes  Recent Labs Lab 03/03/13 0005 03/03/13 0850  TROPONINI <0.30 <0.30  PROBNP  --  3324.0*   Glucose No results found for this basename: GLUCAP,  in the last 168 hours  Imaging Ct Head Wo Contrast  03/03/2013   CLINICAL DATA:  Stroke  EXAM: CT HEAD WITHOUT CONTRAST  TECHNIQUE: Contiguous axial images were obtained from the base of the skull through the vertex without intravenous contrast.  COMPARISON:  Prior CT from 02/08/2013  FINDINGS: Atrophy with chronic microvascular ischemic changes are stable as compared to the prior exam.  There is no acute intracranial hemorrhage or infarct. No mass lesion  or midline shift. Gray-white matter differentiation is well maintained. Ventricles are normal in size without evidence of hydrocephalus. CSF containing spaces are within normal limits. No extra-axial fluid collection.  The calvarium is intact.  Orbital soft tissues are within normal limits.  The paranasal sinuses and mastoid air cells are well pneumatized and free of fluid.  Scalp soft tissues are unremarkable.  IMPRESSION: 1. No acute intracranial infarct or other process identified. 2. Stable atrophy and chronic microvascular ischemic changes.   Electronically Signed   By: Rise Mu M.D.   On: 03/03/2013 03:32   US Renal  03/03/2013   CLINICAL DATA:  Acute renal failure.  EXAM: RENAL/URINARY TRACT ULTRASOUND  COMPLETE  COMPARISON:  None.  FINDINGS: Right Kidney  Length: 9.0 cm. Cortical echogenicity appears very mildly increased. There is no evidence of hydronephrosis or renal mass.  Left Kidney  Length: 9.1 cm. Similar mildly increased cortical echogenicity identified. No hydronephrosis or renal mass is identified.  Bladder  The bladder is decompressed by a Foley catheter.  IMPRESSION: No evidence of renal obstruction bilaterally. Mildly increased cortical echogenicity by ultrasound likely reflects some degree of underlying chronic kidney disease.   Electronically Signed   By: Irish Lack M.D.   On: 03/03/2013 12:54   Dg Pelvis Portable  03/03/2013   CLINICAL DATA:  Left hip fracture  EXAM: PORTABLE PELVIS 1-2 VIEWS  COMPARISON:  February 08, 2013  FINDINGS: There is right femoral hemiarthroplasty in good position. The patient has comminuted marked displaced fracture intertrochanteric left femur which was seen on prior x-ray. This has been fixated with a left femoral rod. However, the proximal component of the left femoral rod projects beyond the left femoral neck. There is lucency surrounding the rod at the lateral intertrochanteric region suggesting loosening.  IMPRESSION: The patient has comminuted marked displaced fracture intertrochanteric left femur which was seen on prior x-ray. This has been fixated with a left femoral rod. However, the proximal component of the left femoral rod projects beyond the left femoral neck. There is lucency surrounding the rod at the lateral intertrochanteric region suggesting loosening.   Electronically Signed   By: Sherian Rein M.D.   On: 03/03/2013 01:18   Dg Chest Port 1 View  03/04/2013   CLINICAL DATA:  Atelectasis  EXAM: PORTABLE CHEST - 1 VIEW  COMPARISON:  Prior chest x-ray 03/03/2013  FINDINGS: Right subclavian approach central venous catheter. The catheter tip projects over the mid SVC. Stable mild cardiomegaly. Atherosclerotic calcifications throughout the  aorta. Increased pulmonary vascular congestion. Increasing patchy opacity in the right upper lobe and bilateral bases. New blunting of the right costophrenic angle may reflect a small pleural effusion, or focal atelectasis. No acute osseous abnormality.  IMPRESSION: 1. The tip of the right subclavian central venous catheter projects over the mid SVC. 2. Increased pulmonary vascular congestion now bordering on mild interstitial edema. 3. Increasing patchy opacity in the right upper lung and bilateral bases favored to reflect atelectasis. Early multifocal infiltrate, or asymmetric pulmonary edema are also possibilities. 4. Trace right pleural effusion.   Electronically Signed   By: Malachy Moan M.D.   On: 03/04/2013 07:32   Dg Chest Portable 1 View  03/03/2013   CLINICAL DATA:  Central line placement.  EXAM: PORTABLE CHEST - 1 VIEW  COMPARISON:  Chest x-ray 03/03/2013.  FINDINGS: There is a right-sided subclavian central venous catheter with tip terminating in the superior aspect of the right atrium. Lung volumes are normal. There is cephalization of  the pulmonary vasculature and slight indistinctness of the interstitial markings suggestive of mild pulmonary edema. Mild cardiomegaly. No definite pleural effusions. Upper mediastinal contours are within limits allowing for slight patient rotation. Atherosclerosis in the thoracic aorta. No pneumothorax. New linear opacity in the right apex presumably an area of subsegmental atelectasis.  IMPRESSION: 1. New right subclavian central venous catheter with tip terminating in the superior aspect of the right atrium. This could be withdrawn approximately 2 cm for more optimal placement at the superior cavoatrial junction. 2. Findings suggest mild congestive heart failure, as above. 3. Atherosclerosis. 4. New area of presumed subsegmental atelectasis in the apex of the right lung.   Electronically Signed   By: Trudie Reed M.D.   On: 03/03/2013 09:02   Dg Chest  Portable 1 View  03/03/2013   CLINICAL DATA:  Left hip fracture  EXAM: PORTABLE CHEST - 1 VIEW  COMPARISON:  February 08, 2013  FINDINGS: The heart size and mediastinal contours are stable. The aorta is tortuous. The heart size is enlarged. Both lungs are clear. There is no focal infiltrate, pulmonary edema, or pleural effusion. The visualized skeletal structures are stable.  IMPRESSION: No active cardiopulmonary disease.   Electronically Signed   By: Sherian Rein M.D.   On: 03/03/2013 01:13       ASSESSMENT / PLAN:  PULMONARY A:In resp distress due to metabolic issues P:   DNI Narcotics for pain IS if able pcxr in am for volume status  CARDIOVASCULAR A: Basline chronic systolic CHF  Hypoteisve and in circulatorshock.? Due to dehydration v sepsis Favor hypovolemia Rel AI P:  EGDT, svo2 met mark Ensure cvp top goal 10-12 Levophed to MAp goal 60 as renal fxn improving Will dc vaso when levo to 5 mics Cortisol ess 20 on pressors, add stress steroids  RENAL A:  Acute renal failure, ATN, hypovolemia, improving P:   Maintain pos balance Chem in am  cvp goal 10-12 Renal US - neg, reviewed Use saline still  GASTROINTESTINAL A:  chrons disease per son and hx of avm per record, asp? P:   ppi Npo Will need to address feeds asap slp  HEMATOLOGIC A:  Anemia of chronic disease  P:  PRBC for hg < 7gm% onlyunless atively bleeding Sub q hep  INFECTIOUS A: No infection identified R/o HCAP, aspiration P:   Vanc dc Maintain zosyn Pct may allow to dc abx earlier  ENDOCRINE A:  adrenal insuff   P:   hydrocort add ssi  NEUROLOGIC A:  Acute delirium due to pain, renal failure, and opioids in setting of renal failure P:   Palliative care for concomitant symptom mgmt Fentanyl prn for pain in setttnig of renal failure Avoid haldol if able   Critical Care Time devoted to patient care services described in this note is  35  Minutes.  Mcarthur Rossetti. Tyson Alias, MD,  FACP Pgr: [phone] [facility] '

## 2013-03-05 ENCOUNTER — Inpatient Hospital Stay (HOSPITAL_COMMUNITY): Payer: Medicare Other

## 2013-03-05 DIAGNOSIS — I509 Heart failure, unspecified: Secondary | ICD-10-CM

## 2013-03-05 DIAGNOSIS — G9340 Encephalopathy, unspecified: Secondary | ICD-10-CM

## 2013-03-05 LAB — COMPREHENSIVE METABOLIC PANEL
AST: 45 U/L — ABNORMAL HIGH (ref 0–37)
Albumin: 2.2 g/dL — ABNORMAL LOW (ref 3.5–5.2)
Alkaline Phosphatase: 125 U/L — ABNORMAL HIGH (ref 39–117)
CO2: 18 mEq/L — ABNORMAL LOW (ref 19–32)
Calcium: 8.8 mg/dL (ref 8.4–10.5)
Chloride: 107 mEq/L (ref 96–112)
Creatinine, Ser: 2.13 mg/dL — ABNORMAL HIGH (ref 0.50–1.10)
GFR calc non Af Amer: 19 mL/min — ABNORMAL LOW (ref >90)
Glucose, Bld: 124 mg/dL — ABNORMAL HIGH (ref 70–99)
Potassium: 4.2 mEq/L (ref 3.5–5.1)
Total Bilirubin: 0.5 mg/dL (ref 0.3–1.2)

## 2013-03-05 LAB — PROCALCITONIN: Procalcitonin: 0.8 ng/mL

## 2013-03-05 MED ORDER — HYDROCORTISONE SOD SUCCINATE 100 MG IJ SOLR
25.0000 mg | Freq: Four times a day (QID) | INTRAMUSCULAR | Status: DC
  Administered 2013-03-05 – 2013-03-06 (×3): 25 mg via INTRAVENOUS
  Filled 2013-03-05 (×7): qty 0.5

## 2013-03-05 MED ORDER — DIAZEPAM 5 MG/ML IJ SOLN
5.0000 mg | INTRAMUSCULAR | Status: DC | PRN
  Administered 2013-03-05 – 2013-03-06 (×6): 5 mg via INTRAVENOUS
  Filled 2013-03-05 (×5): qty 2

## 2013-03-05 MED ORDER — FENTANYL CITRATE 0.05 MG/ML IJ SOLN
200.0000 ug | Freq: Once | INTRAMUSCULAR | Status: AC
  Administered 2013-03-05: 200 ug via INTRAVENOUS
  Filled 2013-03-05: qty 4

## 2013-03-05 MED ORDER — CHLORHEXIDINE GLUCONATE 0.12 % MT SOLN
OROMUCOSAL | Status: AC
  Administered 2013-03-05: 15 mL via OROMUCOSAL
  Filled 2013-03-05: qty 15

## 2013-03-05 MED ORDER — SODIUM CHLORIDE 0.9 % IJ SOLN
10.0000 mL | INTRAMUSCULAR | Status: DC | PRN
  Administered 2013-03-06 – 2013-03-07 (×3): 10 mL

## 2013-03-05 MED ORDER — DIAZEPAM 5 MG/ML IJ SOLN
2.5000 mg | INTRAMUSCULAR | Status: DC | PRN
  Filled 2013-03-05: qty 2

## 2013-03-05 MED ORDER — FUROSEMIDE 10 MG/ML IJ SOLN
60.0000 mg | Freq: Once | INTRAMUSCULAR | Status: AC
  Administered 2013-03-05: 60 mg via INTRAVENOUS
  Filled 2013-03-05 (×2): qty 6

## 2013-03-05 MED ORDER — FENTANYL CITRATE 0.05 MG/ML IJ SOLN
100.0000 ug | INTRAMUSCULAR | Status: DC | PRN
  Administered 2013-03-05 – 2013-03-07 (×12): 100 ug via INTRAVENOUS
  Filled 2013-03-05 (×13): qty 2

## 2013-03-05 MED ORDER — IPRATROPIUM BROMIDE 0.02 % IN SOLN
0.5000 mg | RESPIRATORY_TRACT | Status: DC
  Administered 2013-03-05 – 2013-03-06 (×5): 0.5 mg via RESPIRATORY_TRACT
  Filled 2013-03-05 (×5): qty 2.5

## 2013-03-05 MED ORDER — FENTANYL CITRATE 0.05 MG/ML IJ SOLN
50.0000 ug | INTRAMUSCULAR | Status: DC | PRN
  Administered 2013-03-05 (×3): 50 ug via INTRAVENOUS
  Filled 2013-03-05 (×4): qty 2

## 2013-03-05 MED ORDER — ALBUTEROL SULFATE (5 MG/ML) 0.5% IN NEBU
2.5000 mg | INHALATION_SOLUTION | RESPIRATORY_TRACT | Status: DC
  Administered 2013-03-05 – 2013-03-06 (×5): 2.5 mg via RESPIRATORY_TRACT
  Filled 2013-03-05 (×5): qty 0.5

## 2013-03-05 MED ORDER — ALBUTEROL SULFATE (5 MG/ML) 0.5% IN NEBU: 2.5000 mg | INHALATION_SOLUTION | RESPIRATORY_TRACT | Status: DC | PRN

## 2013-03-05 NOTE — Progress Notes (Signed)
RRT called to take a look at Darlene [name]. She is aggitated with audible wheezes, moaning, sats 88% on [place] 6 L, and HR flucuating between 120's to 130. Bedside RN concerned with increased aggitation, HR, and moaning with pain. Haldol ordered but with parameters for QTC measurements. Transferred today from ICU with DNR status and Palliative Care consult. Dr Phillips Odor paged.

## 2013-03-05 NOTE — Progress Notes (Signed)
SLP Cancellation Note  Patient Details Name: Darlene [name] MRN: [id number] DOB: 1920-06-24   Cancelled treatment:       Treat Not Completed: Other (comment);Fatigue/lethargy limiting ability to participate;Pain limiting ability to participate  Pt with excessive pain today per chart review and is now finally resting, SLP walked in room and pt sound asleep therefore did not disturb.  RN reports pt has been drinking water and tolerating,   Rapid response called earlier due to pt pain and dyspnea.  SLP reviewed palliative note re: plans to manage pain, dyspnea with medications.       Mickie Bail [place], [place] [facility] SLP [phone]

## 2013-03-05 NOTE — Progress Notes (Signed)
Palliative Medicine Team Progress Note  Called by rapid response nurse re: patient's acute decline with respiratory failure. Patient was a Hospice patient up until 11/7 when she revoked to go into a SNF for rehab following a hip fracture. Her only contact is her son Ethelene Browns who has not been available and may not be a stable or able surrogate decision maker based on his own mental and physical health problems. Patient is a DNR, and this has been confirmed in the records and is very reasonable based on her current health. She was transferred out of ICU this AM and started having respiratory failure on the floor. I responded to the RR page and found her to be moaning and agitated-per nurse wheezing improved with administration of Fentanyl. I have also ordered scheduled and PRN nebs since she has questionable history of COPD and is currently wheezing. She continues to yell out for help and appears to be in agony. I am unable to get her son on the phone, but I think she is going to need a continuous infusion of pain and dyspnea medication. If she doesn't get comfortable in the next hour or so I will start her on an infusion because that is the humane and reasonable thing to do for her.  Increased PRN Fentanyl IV Valium for agitation Haldol prn-no QT monitoring necessary Fluids KVO  Will follow closely for her comfort.;  Anderson Malta, DO Palliative Medicine

## 2013-03-05 NOTE — Progress Notes (Signed)
Pt transferred to 1514 today appearing anxious regarding the move, pt agitation continued to worsen and pt c/o pain with any movement and at rest.  Rapid response RN consulted and saw pt, palliative care MD consulted, after some adjustments were made to medications pt was resting and comfortable. Pt awoke in distress and O2 low 70s, MD notified and recommended that VS checks be stopped and pt be treated based on appearance and symptoms, with discontinue monitoring of oxygen saturation per orders per MD. Darlene [name] with son this afternoon x2, contacted per pt request in attempt to console pt, this was ineffective as she was unconsolable by son and he is unable to visit at this time.

## 2013-03-05 NOTE — Progress Notes (Signed)
Severe pain despite fentanyl Q2h  Plan Fentanyl IV x 1 and reassess in If does not work, consider opioid rotation to dilaudid  Dr. Kalman Shan, M.D., [facility].C.P Pulmonary and Critical Care Medicine Staff Physician [facility] Pager: [phone], If no answer or between  15:00h - 7:00h: call [phone]  03/05/2013 12:20 AM

## 2013-03-05 NOTE — Progress Notes (Signed)
Clinical Social Work Department BRIEF PSYCHOSOCIAL ASSESSMENT 03/05/2013  Patient:  Darlene [name], Darlene [name]     Account Number:  [id number]     Admit date:  03/02/2013  Clinical Social Worker:  Dennison Bulla  Date/Time:  03/05/2013 03:00 PM  Referred by:  Physician  Date Referred:  03/05/2013 Referred for  SNF Placement   Other Referral:   Interview type:  Family Other interview type:    PSYCHOSOCIAL DATA Living Status:  FACILITY Admitted from facility:  [facility] Level of care:  Skilled Nursing Facility Primary support name:  Ethelene Browns Primary support relationship to patient:  CHILD, ADULT Degree of support available:   Adequate    CURRENT CONCERNS Current Concerns  Post-Acute Placement   Other Concerns:    SOCIAL WORK ASSESSMENT / PLAN CSW received referral due to patient being admitted from a facility. CSW reviewed chart and attempted to meet with patient at bedside. Patient unable to participate in assessment at this time.    CSW called and spoke with son via phone. Patient was living with son with hospice benefits but fell and broke her hip. Son reports he was unable to care for patient due to being ill himself and needed patient placed. Patient has been at [facility] for about 2 weeks and son wants patient to return to SNF at DC. Son reports concern with patient possibly needing LT placement. CSW encouraged son to apply for Medicaid and provided information for the Department of Social Services.    CSW completed FL2 and spoke with SNF who is agreeable to accept patient at DC. Per chart review, PMT is involved with patient as well. CSW will review chart and assist with patient's needs.   Assessment/plan status:  Psychosocial Support/Ongoing Assessment of Needs Other assessment/ plan:   Information/referral to community resources:   Will return to SNF    PATIENT'S/FAMILY'S RESPONSE TO PLAN OF CARE: Patient unable to participate in assessment. Patient's son reports  he is at home but cannot come to visit patient. Son is worried about patient's LT plans and feels that she might need placement. Son agreeable to complete Medicaid application. Son has CSW contact information if further needs arise.       [place], [place] [phone]

## 2013-03-05 NOTE — Progress Notes (Signed)
PM note - have been asked to assume care for Darlene [name] - will do this as of 0700 hrs 11/26.  At this time she is resting quietly, no respiratory distress, no evidence of pain. She is very HOH. She is confused.  Reviewed Dr. Lamar Blinks note and nursing notes. Agree with comfort care and medications as ordered including prn fentanyl and halodperidol for severe agitation if not controlled with diazepam.  Ideally, should she survive her immediate situation, she would be able to go to [facility] for comfort care.  For tonight please direct calls to [facility] service.   Will see in AM

## 2013-03-05 NOTE — Progress Notes (Signed)
eLink Physician-Brief Progress Note Patient Name: Darlene [name] DOB: 1920-05-31 MRN: [id number]  Date of Service  03/05/2013   HPI/Events of Note   Called by RN for low O2 saturation Read Dr. Lamar Blinks note and discussed with Dr. Tyson Alias Goals of care are for comfort at this point  eICU Interventions  Dr. Tyson Alias recommends lasix and agrees with comfort approach D/C O2 saturation monitoring Focus on comfort      MCQUAID, DOUGLAS 03/05/2013, 6:02 PM

## 2013-03-05 NOTE — Progress Notes (Signed)
PULMONARY  / CRITICAL CARE MEDICINE  Name: Darlene [name] MRN: [id number] DOB: 06/24/1920    PCP [name], MD Cards -? Dr Weston Anna  ADMISSION DATE:  03/02/2013 CONSULTATION DATE: 03/02/2013 REFERRING MD :  Dr.  Rhunette Croft PRIMARY SERVICE: PCCM-->Dr. Arthur Holms  CHIEF COMPLAINT:  Acute delirium and renal failure status post hip fracture, & resolved shock  SIGNIFICANT EVENTS / STUDIES:  11/22 - admission  11/23 - renal US>>>no hydro 11/24 - remains on pressors  LINES / TUBES: Right subclavian line 11/22>>>  CULTURES: 11/23 cdiff>>>neg 11/22 Blood>>> 11/22 Urine>>>neg  ANTIBIOTICS: 11/22 Vancomycin>>>11/24 11/22 Zosyn>>>  SUBJECTIVE: RN reports pt off pressors since 11/24.  BP stable, no acute events.   VITAL SIGNS: Temp:  [98 F (36.7 C)-98.3 F (36.8 C)] 98.3 F (36.8 C) (11/25 0000) Pulse Rate:  [31-126] 103 (11/25 0800) Resp:  [11-29] 24 (11/25 0800) BP: (64-143)/(40-66) 143/62 mmHg (11/25 0800) SpO2:  [79 %-97 %] 92 % (11/25 0800) FiO2 (%):  [50 %] 50 % (11/24 1000) Weight:  [117 lb 1 oz (53.1 kg)] 117 lb 1 oz (53.1 kg) (11/25 0417)  HEMODYNAMICS: CVP:  [6 mmHg-12 mmHg] 12 mmHg  VENTILATOR SETTINGS: Vent Mode:  [-]  FiO2 (%):  [50 %] 50 %  INTAKE / OUTPUT: Intake/Output     11/24 0701 - 11/25 0700 11/25 0701 - 11/26 0700   P.O. 80 60   I.V. (mL/kg) 1783.1 (33.6) 50 (0.9)   Blood     IV Piggyback 400    Total Intake(mL/kg) 2263.1 (42.6) 110 (2.1)   Urine (mL/kg/hr) 680 (0.5) 60 (0.5)   Total Output 680 60   Net +1583.1 +50          PHYSICAL EXAMINATION: General: frail elderly female in NAD Neuro:  Baseline dementia, awake, alert, speech clear but not oriented HEENT: jvd flat  Cardiovascular:  s1 s2 rrr, tachy Lungs:  resp's even/non-labored on [place] O2, lungs bilaterally diminished but clear Abdomen:  Soft, nontender no rigidity Musculoskeletal:  No edema, bruising to L thigh down to L ankle (old) Skin:  Intact at least in  front   LABS:  CBC  Recent Labs Lab 03/03/13 1515 03/04/13 0345 03/04/13 1100  WBC 13.1* 11.2* 13.2*  HGB 8.7* 8.5* 8.6*  HCT 26.7* 26.6* 26.7*  PLT 183 199 220   Coag's  Recent Labs Lab 03/03/13 0850  APTT 29  INR 1.45   BMET  Recent Labs Lab 03/03/13 0850 03/04/13 0345 03/05/13 0430  NA 136 137 138  K 4.4 4.3 4.2  CL 104 105 107  CO2 19 19 18*  BUN 73* 55* 54*  CREATININE 4.83* 2.57* 2.13*  GLUCOSE 99 163* 124*   Electrolytes  Recent Labs Lab 03/03/13 0850 03/04/13 0345 03/05/13 0430  CALCIUM 7.4* 8.4 8.8  MG 2.1 1.9  --   PHOS 6.5* 5.1*  --    Sepsis Markers  Recent Labs Lab 03/03/13 0346 03/03/13 0850 03/04/13 0345 03/05/13 0430  LATICACIDVEN 1.18 0.6  --   --   PROCALCITON  --  1.53 1.06 0.80   ABG  Recent Labs Lab 03/03/13 0320  PHART 7.345*  PCO2ART 37.7  PO2ART 74.8*   Liver Enzymes  Recent Labs Lab 03/03/13 0850 03/05/13 0430  AST 57* 45*  ALT 18 21  ALKPHOS 117 125*  BILITOT 0.3 0.5  ALBUMIN 2.1* 2.2*   Cardiac Enzymes  Recent Labs Lab 03/03/13 0005 03/03/13 0850  TROPONINI <0.30 <0.30  PROBNP  --  3324.0*   Glucose  No results found for this basename: GLUCAP,  in the last 168 hours  Imaging US Renal  03/03/2013   CLINICAL DATA:  Acute renal failure.  EXAM: RENAL/URINARY TRACT ULTRASOUND COMPLETE  COMPARISON:  None.  FINDINGS: Right Kidney  Length: 9.0 cm. Cortical echogenicity appears very mildly increased. There is no evidence of hydronephrosis or renal mass.  Left Kidney  Length: 9.1 cm. Similar mildly increased cortical echogenicity identified. No hydronephrosis or renal mass is identified.  Bladder  The bladder is decompressed by a Foley catheter.  IMPRESSION: No evidence of renal obstruction bilaterally. Mildly increased cortical echogenicity by ultrasound likely reflects some degree of underlying chronic kidney disease.   Electronically Signed   By: Irish Lack M.D.   On: 03/03/2013 12:54   Dg  Chest Port 1 View  03/05/2013   CLINICAL DATA:  Edema.  EXAM: PORTABLE CHEST - 1 VIEW  COMPARISON:  03/04/2013  FINDINGS: Central line tip is in the SVC region. Negative for a pneumothorax. Again noted are prominent central vascular structures and patchy densities throughout both lungs. Findings are concerning for pulmonary edema. Increased densities at the lung bases are suggestive for pleural fluid. Heart size is grossly stable. The thoracic aortic arch is heavily calcified.  IMPRESSION: Prominent central vascular structures and evidence for pulmonary edema. Findings are similar to the recent comparison examination. Probable bilateral pleural effusions.   Electronically Signed   By: Richarda Overlie M.D.   On: 03/05/2013 08:01   Dg Chest Port 1 View  03/04/2013   CLINICAL DATA:  Atelectasis  EXAM: PORTABLE CHEST - 1 VIEW  COMPARISON:  Prior chest x-ray 03/03/2013  FINDINGS: Right subclavian approach central venous catheter. The catheter tip projects over the mid SVC. Stable mild cardiomegaly. Atherosclerotic calcifications throughout the aorta. Increased pulmonary vascular congestion. Increasing patchy opacity in the right upper lobe and bilateral bases. New blunting of the right costophrenic angle may reflect a small pleural effusion, or focal atelectasis. No acute osseous abnormality.  IMPRESSION: 1. The tip of the right subclavian central venous catheter projects over the mid SVC. 2. Increased pulmonary vascular congestion now bordering on mild interstitial edema. 3. Increasing patchy opacity in the right upper lung and bilateral bases favored to reflect atelectasis. Early multifocal infiltrate, or asymmetric pulmonary edema are also possibilities. 4. Trace right pleural effusion.   Electronically Signed   By: Malachy Moan M.D.   On: 03/04/2013 07:32     ASSESSMENT / PLAN:  PULMONARY A: At risk aspiration - in setting of advanced dementia P:   -DNI, no CPR -Narcotics for pain -IS if  able -palliative care are is main concern for [place], such discomfort, pain control should be paramount  CARDIOVASCULAR A:  Basline Chronic Systolic CHF Hypotension - dehydration vs sepsis.  Resolved with volume & brief pressor use.  Relative AI P:  -wean stress steroids to off over 3 days -would recommend no further pressors, will NOT change outcome -accept MAP of 50 with alert mental status and urine output 15 cc/hr  RENAL A:   Acute renal failure - in setting of hypovolemia, improving.  Renal US negative.  P:   -Chem in am  -NS for volume -there is NO evidence clinical failure, remains in need of pos balance -keep at 50 cc/hr  GASTROINTESTINAL A:   Chrons disease - per son and hx of AVM per record, asp? P:   -PPI -Diet as tolerated -SLP  HEMATOLOGIC A:   Anemia of chronic disease P:  -PRBC  for hg < 7gm%  -Sub Q hep  INFECTIOUS A:  R/o HCAP / Aspiration P:   -Maintain zosyn -Pct may allow noted, will consider days abx then dc  ENDOCRINE A:   Adrenal Insufficiency P:   -hydrocort as above, reduce to off over 3 days -ssi  NEUROLOGIC A:   Acute delirium due to pain, renal failure, and opioids in setting of renal failure P:   -Palliative care for concomitant symptom mgmt -Fentanyl prn for pain in setttnig of renal failure  Summary: Frail elderly female with advanced dementia, recent hip fracture and FTT admitted with AMS, acute renal failure in setting of dehydration & ACE.  Admitted to ICU - found to have adrenal insufficiency, hypotension and dehydration.  Required vasopressors, stress steroids & volume resuscitation.  ARF resolving.  Will need taper of stress steroids and transition of abx.  Will transition to medical floor and to Dr. Debby Bud.    Canary Brim, NP-C [place] [facility] Pgr: [phone] or [phone]  I have fully examined this patient and agree with above findings.     Mcarthur Rossetti. Tyson Alias, MD, FACP Pgr: [phone] [place]  Pulmonary & Critical Care

## 2013-03-06 DIAGNOSIS — IMO0002 Reserved for concepts with insufficient information to code with codable children: Secondary | ICD-10-CM

## 2013-03-06 DIAGNOSIS — Z66 Do not resuscitate: Secondary | ICD-10-CM

## 2013-03-06 DIAGNOSIS — K5090 Crohn's disease, unspecified, without complications: Secondary | ICD-10-CM

## 2013-03-06 DIAGNOSIS — Z515 Encounter for palliative care: Secondary | ICD-10-CM

## 2013-03-06 DIAGNOSIS — I2510 Atherosclerotic heart disease of native coronary artery without angina pectoris: Secondary | ICD-10-CM

## 2013-03-06 DIAGNOSIS — I509 Heart failure, unspecified: Secondary | ICD-10-CM

## 2013-03-06 LAB — BASIC METABOLIC PANEL
CO2: 16 mEq/L — ABNORMAL LOW (ref 19–32)
Calcium: 9.3 mg/dL (ref 8.4–10.5)
GFR calc Af Amer: 25 mL/min — ABNORMAL LOW (ref >90)
GFR calc non Af Amer: 22 mL/min — ABNORMAL LOW (ref >90)
Glucose, Bld: 148 mg/dL — ABNORMAL HIGH (ref 70–99)
Potassium: 3.7 mEq/L (ref 3.5–5.1)
Sodium: 138 mEq/L (ref 135–145)

## 2013-03-06 MED ORDER — HYDROCORTISONE SOD SUCCINATE 100 MG IJ SOLR
25.0000 mg | Freq: Three times a day (TID) | INTRAMUSCULAR | Status: DC
  Administered 2013-03-06 – 2013-03-07 (×3): 25 mg via INTRAVENOUS
  Filled 2013-03-06 (×6): qty 0.5

## 2013-03-06 MED ORDER — IPRATROPIUM BROMIDE 0.02 % IN SOLN
0.5000 mg | Freq: Four times a day (QID) | RESPIRATORY_TRACT | Status: DC
  Administered 2013-03-06 – 2013-03-07 (×4): 0.5 mg via RESPIRATORY_TRACT
  Filled 2013-03-06 (×4): qty 2.5

## 2013-03-06 MED ORDER — FUROSEMIDE 10 MG/ML IJ SOLN
40.0000 mg | Freq: Four times a day (QID) | INTRAMUSCULAR | Status: DC
  Administered 2013-03-06 – 2013-03-07 (×4): 40 mg via INTRAVENOUS
  Filled 2013-03-06 (×9): qty 4

## 2013-03-06 MED ORDER — ALBUTEROL SULFATE (5 MG/ML) 0.5% IN NEBU
2.5000 mg | INHALATION_SOLUTION | Freq: Four times a day (QID) | RESPIRATORY_TRACT | Status: DC
  Administered 2013-03-06 – 2013-03-07 (×4): 2.5 mg via RESPIRATORY_TRACT
  Filled 2013-03-06 (×4): qty 0.5

## 2013-03-06 NOTE — Progress Notes (Signed)
Clinical Social Work  CSW received a call from Lafonda Mosses who works with [employer] who reports patient has been accepted to hospice home and can admit today. Hospice home has already spoken to son and reports they will go to his home in order to complete paperwork. CSW called and left a message with attending MD RN regarding need for DC orders and summary. CSW will continue to follow to assist with DC planning.  [place], [place] [phone]

## 2013-03-06 NOTE — Progress Notes (Signed)
PM note: Mrs. Cienfuegos has had increased WOB of breathing and weakness. She appears to be premorbid and not stable for transport this PM.  Plan Continue comfort care: fentanyl and valium for pain and anxiety  Low flow O2  Will ask for chaplain visit  Will reassess for d/c to [facility] hospice house in AM `

## 2013-03-06 NOTE — Progress Notes (Signed)
Subjective: Darlene [name] is well known to me: she has had multiple admissions for acute anemia due to AVM bleeds and accompanying cardiac stress with NSTEMI; h/o pulmonary edema when transfused; severe CAD and not a candidate for intervention; failure to thrive; recent fall with intertrochanteric fracture left hip s/p nailing. She was unable to be cared for at home and was recently moved to [facility] with consequent discontinuation of hospice services. She was readmitted for uncontrolled pain hip, respiratory infection and recurrent anemia. She was in the ICU/step down unit on pressors until yesterday. She was transfused 2 uPRBCs with subsequent pulmonary edema. Yesterday after moving to med surg floor she had episode hypotension, hypoxemia and uncontrolled pain. Dr Phillips Odor for palliative care was consulted and she has been treated with fentanyl and valium. She was deemed a candidate for comfort care: oxygenation monitoring was stopped, she was started on scheduled lasix and was to continue with supportive care. For her delirium she has been getting valium and haloperidol as needed.  Darlene [name] is awake but not attentive: does not respond to verbal stimuli, does respond to pain. Did try to take a sip of water.   Objective: Lab:  Recent Labs  03/03/13 0850 03/03/13 1515 03/04/13 0345 03/04/13 1100  WBC 9.1 13.1* 11.2* 13.2*  NEUTROABS 7.7  --   --  11.6*  HGB 6.7* 8.7* 8.5* 8.6*  HCT 20.9* 26.7* 26.6* 26.7*  MCV 92.5 90.5 90.2 90.2  PLT 170 183 199 220    Recent Labs  03/03/13 0850 03/04/13 0345 03/05/13 0430 03/06/13 0430  NA 136 137 138 138  K 4.4 4.3 4.2 3.7  CL 104 105 107 105  GLUCOSE 99 163* 124* 148*  BUN 73* 55* 54* 57*  CREATININE 4.83* 2.57* 2.13* 1.94*  CALCIUM 7.4* 8.4 8.8 9.3  MG 2.1 1.9  --   --   PHOS 6.5* 5.1*  --   --     Imaging: 11/25  CXR: IMPRESSION:  Prominent central vascular structures and evidence for pulmonary  edema. Findings are similar to the  recent comparison examination.  Probable bilateral pleural effusions.  Scheduled Meds: . ipratropium  0.5 mg Nebulization Q4H   And  . albuterol  2.5 mg Nebulization Q4H  . antiseptic oral rinse  15 mL Mouth Rinse QID  . chlorhexidine  15 mL Mouth Rinse BID  . heparin  5,000 Units Subcutaneous Q12H  . hydrocortisone sodium succinate  25 mg Intravenous Q6H  . pantoprazole (PROTONIX) IV  40 mg Intravenous QHS  . piperacillin-tazobactam (ZOSYN)  IV  2.25 g Intravenous Q8H   Continuous Infusions:  PRN Meds:.sodium chloride, albuterol, diazepam, fentaNYL, sodium chloride   Physical Exam: Filed Vitals:   03/06/13 0500  BP: 111/71  Pulse: 81  Temp: 97.5 F (36.4 C)  Resp: 18    Intake/Output Summary (Last 24 hours) at 03/06/13 0744 Last data filed at 03/06/13 0400  Gross per 24 hour  Intake   1090 ml  Output   1320 ml  Net   -230 ml   Gen'l - very elderly woman who is minimally responsive, not agitated with no apparent discomfort Cor  2+ radial pulse, quiet precordium, heart sounds are distant, JVD 7 cm 30 degrees with canon waves. Pulm - mild increased WOB, decreased breath sounds/shallow respirations, diffuse rales. Abd - protuberant, BS+ Neuro - eyes open, picking at her lips, non-verbal.      Assessment/Plan: 1. PUlmonary - advanced COPD. At this time seems comfortable on 4  l nasal oxygen.  2. Cardiac - clinical in heart failure with pulmonary edema; increased WOB, rales on exam. Plan IV lasix q 6 for comfort care  3. Renal - Bmet reveals improvement with lower Creatinine. Plan Will stop lab draws.  4. GI - appears stable. No hematochezia  5. ID  HCAP/aspiration. No cough. Last CBC with WBC trending down. Plan Will d/c antibiotics  6. Anemia - h/o LGI bleeds/ AVMs. Hgb 11/24 8.6 g  7. Neuro - not agitated and not in apparent pain. Delirium does not seem acute but she is sedated with valium and fnetanyl for pain control.   8. Comfort care -  Her prognosis is  poor. Spoke with her son - updated him on her condition and that our plan is comfort care. If she survives this admission she is a candidate for [facility].   [name] [facility] (o) [phone]; (c) [phone] Call-grp - Patsi Sears IM  Tele: [phone]  03/06/2013, 7:26 AM

## 2013-03-06 NOTE — Progress Notes (Signed)
Clinical Social Work  CSW spoke with MD who reports he will evaluate patient and DC to Hospice home if appropriate after 5 pm. CSW updated hospice home Lafonda Mosses) who reports patient can be admitted late tonight. CSW prepared PTAR paperwork and instructed RN to call [phone] for transport. Son to complete paperwork and is aware and agreeable to plan. RN to fax DC summary and call report at DC.  [place], [place] [phone]

## 2013-03-06 NOTE — Progress Notes (Signed)
Clinical Social Work  CSW received referral to offer hospice choice. CSW spoke with son via phone. Son reports he spoke with MD and prefers [facility] due to close proximity at home. CSW spoke with Carley Hammed at [facility] who reports no available space at facility. CSW provided son with this information and discussed further DC options. Son reports he is concerned about patient returning to SNF due to insurance concerns. CSW explained other hospice facilities in the area and son is interested in [facility].   CSW spoke with Delray Alt at [facility] who reports available beds but would need to review referral. CSW faxed referral and will await for facility to review in order to determine if they can accept patient. CSW will continue to follow.  [place], [place] [phone]

## 2013-03-06 NOTE — Progress Notes (Signed)
Clinical Social Work  [facility] received referral and report they will come to evaluate patient. CSW will continue to follow.  [place], [place] [phone]

## 2013-03-06 NOTE — Progress Notes (Signed)
Prefer [facility] for proximity: son has no transportation and will not be able to afford to take taxi to [employer]. Also - I prefer to remain her attending which cannot do if she is in [facility].

## 2013-03-06 NOTE — Consult Note (Signed)
[facility] Liaison: Received request from CSW and Dr. Debby Bud for family interest in [facility]. No additional availability today. Will continue to follow, update CSW with changes re availability. Thank you. Forrestine Him LCSW [phone]

## 2013-03-07 DIAGNOSIS — C44111 Basal cell carcinoma of skin of unspecified eyelid, including canthus: Secondary | ICD-10-CM

## 2013-03-07 DIAGNOSIS — I10 Essential (primary) hypertension: Secondary | ICD-10-CM

## 2013-03-07 DIAGNOSIS — IMO0002 Reserved for concepts with insufficient information to code with codable children: Secondary | ICD-10-CM

## 2013-03-07 LAB — TYPE AND SCREEN
ABO/RH(D): A POS
Antibody Screen: NEGATIVE
Unit division: 0

## 2013-03-07 MED ORDER — FUROSEMIDE 40 MG PO TABS: 40.0000 mg | ORAL_TABLET | Freq: Two times a day (BID) | ORAL | Status: AC

## 2013-03-07 MED ORDER — LORAZEPAM 0.5 MG PO TABS
0.5000 mg | ORAL_TABLET | ORAL | Status: DC | PRN
  Administered 2013-03-07: 0.5 mg via ORAL
  Filled 2013-03-07: qty 1

## 2013-03-07 MED ORDER — FUROSEMIDE 40 MG PO TABS
40.0000 mg | ORAL_TABLET | Freq: Two times a day (BID) | ORAL | Status: DC
  Filled 2013-03-07 (×3): qty 1

## 2013-03-07 MED ORDER — MORPHINE SULFATE (CONCENTRATE) 20 MG/ML PO SOLN: ORAL | Status: AC

## 2013-03-07 MED ORDER — MORPHINE SULFATE (CONCENTRATE) 10 MG /0.5 ML PO SOLN
5.0000 mg | ORAL | Status: DC
  Administered 2013-03-07: 5 mg via ORAL
  Filled 2013-03-07: qty 0.5

## 2013-03-07 MED ORDER — ALBUTEROL SULFATE (5 MG/ML) 0.5% IN NEBU: 2.5000 mg | INHALATION_SOLUTION | RESPIRATORY_TRACT | Status: AC | PRN

## 2013-03-07 NOTE — Progress Notes (Signed)
Subjective: Darlene [name] is doing BETTER! She is more awake and alert, asking about her son, her disposition and her future. Not in respiratory distress at this time.   Objective: Lab:  Recent Labs  03/04/13 1100  WBC 13.2*  NEUTROABS 11.6*  HGB 8.6*  HCT 26.7*  MCV 90.2  PLT 220    Recent Labs  03/05/13 0430 03/06/13 0430  NA 138 138  K 4.2 3.7  CL 107 105  GLUCOSE 124* 148*  BUN 54* 57*  CREATININE 2.13* 1.94*  CALCIUM 8.8 9.3    Imaging:  Scheduled Meds: . ipratropium  0.5 mg Nebulization Q6H   And  . albuterol  2.5 mg Nebulization Q6H  . antiseptic oral rinse  15 mL Mouth Rinse QID  . chlorhexidine  15 mL Mouth Rinse BID  . furosemide  40 mg Intravenous Q6H  . hydrocortisone sodium succinate  25 mg Intravenous Q8H  . pantoprazole (PROTONIX) IV  40 mg Intravenous QHS   Continuous Infusions:  PRN Meds:.sodium chloride, albuterol, diazepam, fentaNYL, sodium chloride   Physical Exam: Filed Vitals:   03/06/13 2118  BP: 116/64  Pulse: 106  Temp: 98.8 F (37.1 C)  Resp: 20   See d/c summary     Assessment/Plan: Ready/stable for transfer to residential hospice when bed available and transportation arranged.  Priority d/c dictation # [phone]   [name] [facility] IM ([phone]; (c) [phone] Call-grp - Patsi Sears IM  Tele: [phone]  03/07/2013, 9:20 AM

## 2013-03-07 NOTE — Discharge Summary (Signed)
Darlene [name], Darlene [name]             ACCOUNT NO.:  [id number]  MEDICAL RECORD NO.:  [id number]  LOCATION:  1514                         FACILITY:  [facility]  PHYSICIAN:  Rosalyn Gess. Dinesha Twiggs, MD  DATE OF BIRTH:  [birth year]  DATE OF ADMISSION:  03/02/2013 DATE OF DISCHARGE:  03/07/2013                              DISCHARGE SUMMARY   ADMITTING DIAGNOSES: 1. Severe pain. 2. Sepsis. 3. Anemia. 4. Respiratory distress.  DISCHARGE DIAGNOSES: 1. Sepsis resolved. 2. Anemia chronic with stable hemoglobin. 3. Congestive heart failure with acute work of breathing improved. 4. Coronary artery disease, stable. 5. Adrenal insufficiency, treated. 6. Acute delirium secondary to the above. 7. Disposition end of life care discharged to hospice House.  CONSULTANTS:  Admitting Service was Dr. Tyson Alias in Critical Care Medicine.  Consultants included Dr. Phillips Odor for Palliative Care.  PROCEDURES: 1. Imaging:  Chest x-ray day of admission with no active     cardiopulmonary disease. 2. X-ray pelvis March 03, 2013, with comminuted marked displaced     fracture intertrochanteric left femur was seen on prior x-ray.     This has been fixated with left femoral rod.  However, the proximal     component of the left femoral rod projects beyond the left femoral     neck.  There was lucency surrounding the rod at the lateral     intertrochanteric region suggestive of loosening. 3. CT of the head without contrast, March 03, 2013, with no acute     intracranial infarct or other process identified.  Stable atrophy     with chronic microvascular ischemic changes. 4. Chest x-ray, portable March 03, 2013, with new right subclavian     central venous catheter in place.  Mild congestive heart failure     noted.  New area presume subsegmental atelectasis in the apex of     the right lung. 5. Renal ultrasound March 03, 2013, which showed no evidence of     renal obstruction bilaterally.  Mildly increased  cortical     echogenicity reflecting some degree of underlying chronic kidney     disease. 6. Chest x-ray March 04, 2013, subclavian line in place, increased     pulmonary vascular congestion.  Increasing patchy opacity right     upper lung on bilateral bases favoring atelectasis.  Trace right     pleural effusion. 7. Chest x-ray March 05, 2013, prominent central vascular     structures and evidence for pulmonary edema noted, probable     bilateral pleural effusions noted.  HISTORY OF PRESENT ILLNESS:  [name] is a 79 year old woman living previously at home recently moved to [facility].  She has a long- standing history of Crohn disease, chronic systolic heart failure, history of hematochezia secondary to AV malformations, with multiple admissions for acute anemia accompanied by non-ST elevation MI.  History of multiple episodes of congestive heart failure usually following transfusion.  The patient had a fall fracturing her left hip on November 2014, and underwent operative repair and internal fixation.  The patient did return to home under hospice care but her son was unable to meet her need and she was having extreme pain.  She was transferred to  [facility].  After a very short time, she was subsequently transferred back to [facility] Emergency Department because of acute delirium, severe left hip pain, anemia, and weakness.  At the time of her initial evaluation, she was thought to be obtunded with intermittent episodes of severe crying for pain and discomfort.  The patient was thought to be clinically dry as well as being in acute renal failure with hypotension despite 4 L of resuscitation.  In the emergency department, a subclavian line was placed and Critical Care Medicine was asked to see and admit the patient.  The patient was a DNR/DNI but full pressor, central line, and full medical care was desired by the patient's son.  Please see the admission note for  past medical history, family history, social history, and admission medications as well as admitting examination.  HOSPITAL COURSE: 1. Sepsis with hypotension.  The patient was admitted to the step-     down unit.  She was started on pressor agents.  She was given     oxygen.  She was started on Zosyn for infection origin not well     defined.  She had cultures that were negative for urine, negative     for blood.  She also was given vancomycin from November 22 to     March 04, 2013.  The patient did stabilize and was able to be     weaned off pressors.  At that point, she was transferred to a     medical floor. 2. ID.  No source of infection was clearly identified.  The patient as     noted above was treated with vanc and Zosyn.  She was transferred     to the floor on Zosyn alone, this was discontinued on March 06, 2013.  The patient has remained afebrile.  Her white count was     minimally elevated.  She demonstrated no evidence of ongoing     infection. 3. Renal.  The patient had significant acute renal insufficiency with     underlying chronic renal insufficiency.  The patient was hydrated.     Her creatinine did come down to 1.94.  At that time, she was converted to full comfort care and additional laboratories were     stopped.  She has had good urine output. 4. GI.  The patient has had no evidence of active hematochezia.  Her     hemoglobin at the time of admission was 6.7 g of hemoglobin.  She     was transfused to a hemoglobin of 8.6, and this appeared to be     stable.  Followup laboratory was discontinued as of March 04, 2013, when she had a hemoglobin of 8.6 g. 5. Chronic anemia.  As above, the patient had a low hemoglobin was     transfused and appeared to be stable. 6. Cardiac.  The patient has known severe coronary artery disease.     She has been followed by Dr. Jacinto Halim.  She was not considered to be a     candidate for any further diagnostic  interventions or treatment     interventions.  She has a history of a non-STEMI whenever she would     become profoundly anemic.  There was no evidence of it this     admission. The patient has a history of tipping into pulmonary edema/heart     failure with fluid challenges and transfusions.  She did go into     pulmonary edema this admission by x-ray and by clinical exam.  She     became dyspneic and had significant abnormal findings on exam with     decreased breath sounds and increasing rales.  The patient was     given a round of IV Lasix.  On March 06, 2013, she appeared to     be premorbid but overnight is markedly improved, so she is now able     to speak whole sentences.  She does not have significant increased     work of breathing.  Breath sounds are improved.  She still has     bibasilar rales.  The patient will need to continue on a low dose     of Lasix in regards to her heart failure. 7. Neuro.  The patient during this admission was delirious and     disoriented.  She was agitated.  She did require several doses of     haloperidol for her agitation.  She was also treated with Valium     for agitation with good results. 8. Comfort care.  The patient with multiple comorbidities and clearly     approaching end of life.  The primary problem has been severe pain     in her left hip status post fracture and operative repair and     internal fixation, which may be unstable based on x-ray.  The     patient not considered a surgical candidate for a repeat     intervention or repair.  At this point, she is a non ambulator.     For her severe pain, the patient has been started on fentanyl which     has been adequate to control her pain. At discharge she is converted to Roxanol for pain control given on schedule.  End of life care.  At this point, with the patient's multiple     comorbidities and with her son's consent, the patient is now a     candidate for return to hospice care.   She is totally dependent,     a non ambulator with significant medication requirements and     therefore is a candidate for residential hospice.  The patient will     be transferred to hospice unit when bed is available.  DISCHARGE PHYSICAL EXAMINATION:  VITAL SIGNS:  Temperature was 98.8. Last blood pressure on March 06, 2013, was 116/64, heart rate was 106, oxygen saturation was 89% on 4 L nasal cannula.  Respiratory rate was 20. GENERAL APPEARANCE:  This is a very elderly, chronically ill-appearing woman who does not appear to be any distress. HEENT:  Temporal wasting is noted about the face.  The patient has a chronic lesion on her left lower eyelid which is thought to be a basal cell carcinoma, which has been slowly increasing in size.  The patient does have pupils that are reactive.  The patient is missing many teeth, but has no oral lesions. NECK:  Supple, without thyromegaly. NODES:  No adenopathy is appreciated. PULMONARY:  The patient with mild increased work of breathing, a slight neck retractions on exam.  She does not seem to be air hungry.  The patient does have bilateral rales, but she has improved breath sounds. CARDIOVASCULAR:  1+ radial pulse.  Her precordium is quiet.  Heart sounds were distant but regular. ABDOMEN:  Protuberant.  She has positive bowel sounds.  No guarding or rebound.  Her  tenderness is noted. GENITALIA:  The patient does have an indwelling Foley catheter, which should be continued for comfort care management and we will defer to hospice house medical staff in regard to continuation. EXTREMITIES:  Without deformity, although left hip was not examined. There was a well-healed surgical scar, but that did not put her through range of motion. DERM:  The patient seems to have no obvious skin breakdown. NEURO:  The patient is awake, she recognizes this examiner.  She does have clear speech. She is extremely hard of hearing.  Her cognition seems to  be improved and close to her baseline.  She is not agitated.  FINAL LABORATORY:  From March 06, 2013, sodium was 138, potassium 3.7, chloride 105, CO2 of 16, BUN 57, creatinine 1.94, glucose 148, calcium 9.3.  Liver functions from March 05, 2013, with alkaline phosphatase of 125, albumin 2.2.  AST of 45, ALT of 21, total protein 5.5.  Final CBC from March 04, 2013, with a white count of 13,200, hemoglobin of 8.6 g, platelet count of 220,000, differential with 88% segs, 7% lymphs, 5% monos.  DISCHARGE MEDICATIONS: 1. Nebulizer treatments 2.5 mg of albuterol q.2 hours p.r.n. wheezing     and discomfort with dyspnea. 2. Pro-Stat feeding supplements sugar free 30 mL by mouth daily. 3. Norvasc 10 mg daily. 4. Valium 5 mg p.o. q.4 hours p.r.n. agitation. 5. Feeding supplement of choice 1 container by mouth 2 times daily. 6. Lasix 40 mg tablet daily for comfort care and for pulmonary edema. 7. Hydrocortisone 1% cream applied as needed for rectal inflammation     and inflamed hemorrhoids. 8. Ativan 0.5 mg b.i.d. for anxiety and agitation. 9. Toprol-XL 100 mg 1 tablet daily. 10.Roxanol 20 mg/mL 5 mg every 4 hours as needed for pain and     discomfort. 11.Sublingual nitroglycerin p.r.n. acute chest pain. 12.Omeprazole 20 mg q.a.m. for prevention of gastric irritation and     pain. 13.Requip 0.5 mg at bedtime for comfort in regards to restless legs     syndrome. 14.Senokot-S 2 tablets at bedtime for making comfort and relief of     constipation. 15.Sertraline 50 mg daily for comfort care and relief of agitation.  DISPOSITION:  The patient to be transferred to [facility] when arrangements can be finalized and transportation arranged.  She is a DNR/DNI, hopefully not return to hospital unless necessary for comfort care only.  Should she require return to hospital it is preferable she [facility] to [facility] or [facility].  PROGNOSIS:  Terminal given her multiple  comorbidities.     Rosalyn Gess Darlene Warwick, MD     MEN/MEDQ  D:  03/07/2013  T:  03/07/2013  Job:  [id number]

## 2013-03-09 LAB — CULTURE, BLOOD (ROUTINE X 2): Culture: NO GROWTH

## 2013-04-11 DEATH — deceased

## 2013-04-24 NOTE — Progress Notes (Signed)
Date of visit,  03/01/2013 Code Status:  DNR   Patient ID: Darlene [name], female   DOB: 1920-11-16, 80 y.o.   MRN: [id number] Allergies  Allergen Reactions  . Antihistamines, Chlorpheniramine-Type Other (See Comments)    unknown  . Chocolate Other (See Comments)    Pt states "makes her sick" and she starts coughing   Chief Complaint  Patient presents with  . Acute Visit   History of Present Illness:   Pt is a 80 year old Caucasian female, with a recent L Left Intertrochanteric Hip Fracture, subsequently had a INTRAMEDULLARY (IM) NAIL INTERTROCHANTRIC  nail placed. Unfortunately, the nail has dislodged and has gone through the bone into the muscle. She has been seen by her surgeon who opts no further surgical interventions at this time.  The patient is an extreme amount of pain. She has been on Roxanol, 20 mg per mL, 0.25 mL 5 mg every 4 hours as needed. She is in such severe pain, that she has not wanted to eat or drink.  Recent lab values would also suggest dehydration.    Past Medical History  Diagnosis Date  . UTI (urinary tract infection)   . IBS (irritable bowel syndrome)   . Abdominal pain, left lower quadrant   . CAD (coronary artery disease)   . Pneumonia   . HTN (hypertension)   . Hyperlipidemia   . Diverticulosis of colon   . History of colon cancer   . Allergic rhinitis   . Angina   . Cancer   . Arthritis   . Shortness of breath   . Anemia   . Blood transfusion   . Anxiety   . CHF (congestive heart failure)   . GERD (gastroesophageal reflux disease)   . Peripheral vascular disease    Past Surgical History  Procedure Laterality Date  . Hemicolectomy  1991    Right for mgt of colon cancer.  . Angioplasty  1989    Stent x 2  . Appendectomy    . Bilateral salpingoophorectomy    . Breast surgery      benign  . Orif hip fracture  12/2009    Dr Alvan Dame  . Esophagogastroduodenoscopy  08/09/2011    Procedure: ESOPHAGOGASTRODUODENOSCOPY (EGD);  Surgeon:  Lafayette Dragon, MD;  Location: [facility] ENDOSCOPY;  Service: Endoscopy;  Laterality: N/A;  . Colonoscopy  10/24/2011    Procedure: COLONOSCOPY;  Surgeon: Jerene Bears, MD;  Location: [place];  Service: Gastroenterology;  Laterality: N/A;  . Colon surgery    . Intramedullary (im) nail intertrochanteric Left 02/09/2013    Procedure: INTRAMEDULLARY (IM) NAIL INTERTROCHANTRIC;  Surgeon: Newt Minion, MD;  Location: [place];  Service: Orthopedics;  Laterality: Left;   Medications Roxanol 20 mg/ml, 0.25 ml or 5 mg by mouth every 4 hours as needed Percocet, 10/325 by mouth every 4 hours as needed, not to exceed more than 2,000 mg of acetaminophen per day.   SL NTG 0.4mg , SL prn chest pain, one tab SL every 5 min x 3 Ativan 0.5 mg twice a day Preparation H prn Altace 10 mg per day Requip 0.5 mg at bedtime Senokot-S two tabs at bedtime Hytrin 1 mg per day Metoprolol XL 100 mg per day Lipitor 40 mg per day Lasix 40 mg per day ASA 325 mg per day Zoloft 50 mg at bedtime  Norvasc 10 mg per day Omeprazole 20 mg per day Claritin 10 mg per day Multivitamin with mineral once a day  Labs, 03/01/2013 WBC 6.1 RBC 2.8 HGB 8.0 HCT 26.4 MCV 94 MCH 28.5 MCHC 30.3 RDW 19.4 PLT 251 Lym% at 13.4 Lym # 0.8 Other differential WNL  Na 129 K 4.7 Chloride 92 CO2 30 AGAP 8 Gluc 108 BUN 41 Cr 2.4 Ca 8.7  Physical Examination:  Pt is too uncomfortable for vitals at this time.  She is alert, obviously in pain. AP is rapid BBrS are clear L leg is extremely ecchymotic   Assessment/plan:   Pain, uncontrolled, 5 mg of roxanol, 0.25 ml every 4 hours as needed is inadequate, will routinely give roxanol 0.20ml or 5 mg every 4 hours and continue her Percocet  10/325 every 6 hours as needed. Would prefer to not go above 2,000-3,000 mg of acetaminophen per day  Pain medication will be increased as necessary to afford comfort.   Would recommend pt be comfort measures only, she has been a hospice pt, but  was admitted to [facility] for at least, some level of rehab    Will obtain a gel overlay mattress as soon as possible.    Dehydration:  As evidenced by increase in creatinine from 1.7 to 2.4 with a BUN of 41, will begin Clysis with D5 1/2 NS at 50-60 cc hr.  Believe that clysis would be more comfortable for the patient.   Will continue to monitor her hydration status. Hold her lasix for now.  May hold Altace.  Main focus is Roxanol and ativan.   Am concerned that pt may have difficulty swallowing and will monitor.    Lipid metabolism, see no reason to continue a statin at this time

## 2013-04-24 NOTE — Progress Notes (Signed)
Date of Visit 02/27/2013  Patient ID: Darlene [name], female   DOB: [birth year], 80 y.o.   MRN: [id number] Allergies  Allergen Reactions  . Antihistamines, Chlorpheniramine-Type Other (See Comments)    unknown  . Chocolate Other (See Comments)    Pt states "makes her sick" and she starts coughing   Chief Complaint  Patient presents with  . Acute Visit   History of Present Illness:   Pt is a 80 year old Caucasian female, with a recent L Left Intertrochanteric Hip Fracture, subsequently had a INTRAMEDULLARY (IM) NAIL INTERTROCHANTRIC  nail placed. Unfortunately, the nail has dislodged and has gone through the bone into the muscle. She has been seen by her surgeon who opts no further surgical interventions at this time.  The patient is an extreme amount of pain. She has been on Roxanol, 20 mg per mL, 0.25 mL 5 mg every 4 hours as needed. I in changing that from an as-needed basis to routine administration.            Past Medical History  Diagnosis Date  . UTI (urinary tract infection)   . IBS (irritable bowel syndrome)   . Abdominal pain, left lower quadrant   . CAD (coronary artery disease)   . Pneumonia   . HTN (hypertension)   . Hyperlipidemia   . Diverticulosis of colon   . History of colon cancer   . Allergic rhinitis   . Angina   . Cancer   . Arthritis   . Shortness of breath   . Anemia   . Blood transfusion   . Anxiety   . CHF (congestive heart failure)   . GERD (gastroesophageal reflux disease)   . Peripheral vascular disease    Past Surgical History  Procedure Laterality Date  . Hemicolectomy  1991    Right for mgt of colon cancer.  . Angioplasty  1989    Stent x 2  . Appendectomy    . Bilateral salpingoophorectomy    . Breast surgery      benign  . Orif hip fracture  12/2009    Dr Alvan Dame  . Esophagogastroduodenoscopy  08/09/2011    Procedure: ESOPHAGOGASTRODUODENOSCOPY (EGD);  Surgeon: Lafayette Dragon, MD;  Location: [facility] ENDOSCOPY;  Service:  Endoscopy;  Laterality: N/A;  . Colonoscopy  10/24/2011    Procedure: COLONOSCOPY;  Surgeon: Jerene Bears, MD;  Location: [place];  Service: Gastroenterology;  Laterality: N/A;  . Colon surgery    . Intramedullary (im) nail intertrochanteric Left 02/09/2013    Procedure: INTRAMEDULLARY (IM) NAIL INTERTROCHANTRIC;  Surgeon: Newt Minion, MD;  Location: [place];  Service: Orthopedics;  Laterality: Left;   Medications Roxanol 20 mg/ml, 0.25 ml or 5 mg by mouth every 4 hours as needed Percocet, 10/325 by mouth every 4 hours as needed, not to exceed more than 2,000 mg of acetaminophen per day.   SL NTG 0.4mg , SL prn chest pain, one tab SL every 5 min x 3 Ativan 0.5 mg twice a day Preparation H prn Altace 10 mg per day Requip 0.5 mg at bedtime Senokot-S two tabs at bedtime Hytrin 1 mg per day Metoprolol XL 100 mg per day Lipitor 40 mg per day Lasix 40 mg per day ASA 325 mg per day Zoloft 50 mg at bedtime  Norvasc 10 mg per day Omeprazole 20 mg per day Claritin 10 mg per day Multivitamin with mineral once a day     Assessment/plan:   Pain, uncontrolled, 5  mg of roxanol, 0.25 ml every 4 hours as needed is inadequate, will routinely give roxanol 0.64ml or 5 mg every 4 hours and continue her Percocet  10/325 every 6 hours as needed. Would prefer to not go above 2,000-3,000 mg of acetaminophen per day   Will obtain a gel overlay mattress as soon as possible.    Dehydration:  As evidenced by increase in creatinine from 1.7 to 2.4 with a BUN of 41, will begin Clysis with D5 1/2 NS at 50-60 cc hr.  Believe that clysis would be more comfortable for the patient. Will continue to monitor her hydration status. Hold her lasix for now.  May hold Altace.  Am concerned that pt may have difficulty swallowing and will monitor.    Lipid metabolism, see no reason to continue a statin at this time                This encounter was created in error - please disregard.

## 2013-07-11 NOTE — Telephone Encounter (Signed)
This encounter was created in error - please disregard.

## 2014-02-28 IMAGING — CR DG FEMUR 2V*L*
1 series · 1 of 1 positions shown · non-contrast
Comparison: None

CLINICAL DATA: Fell today, proximal left femoral pain and deformity

EXAM:
LEFT FEMUR - 2 VIEW

[w cross table hip left *]
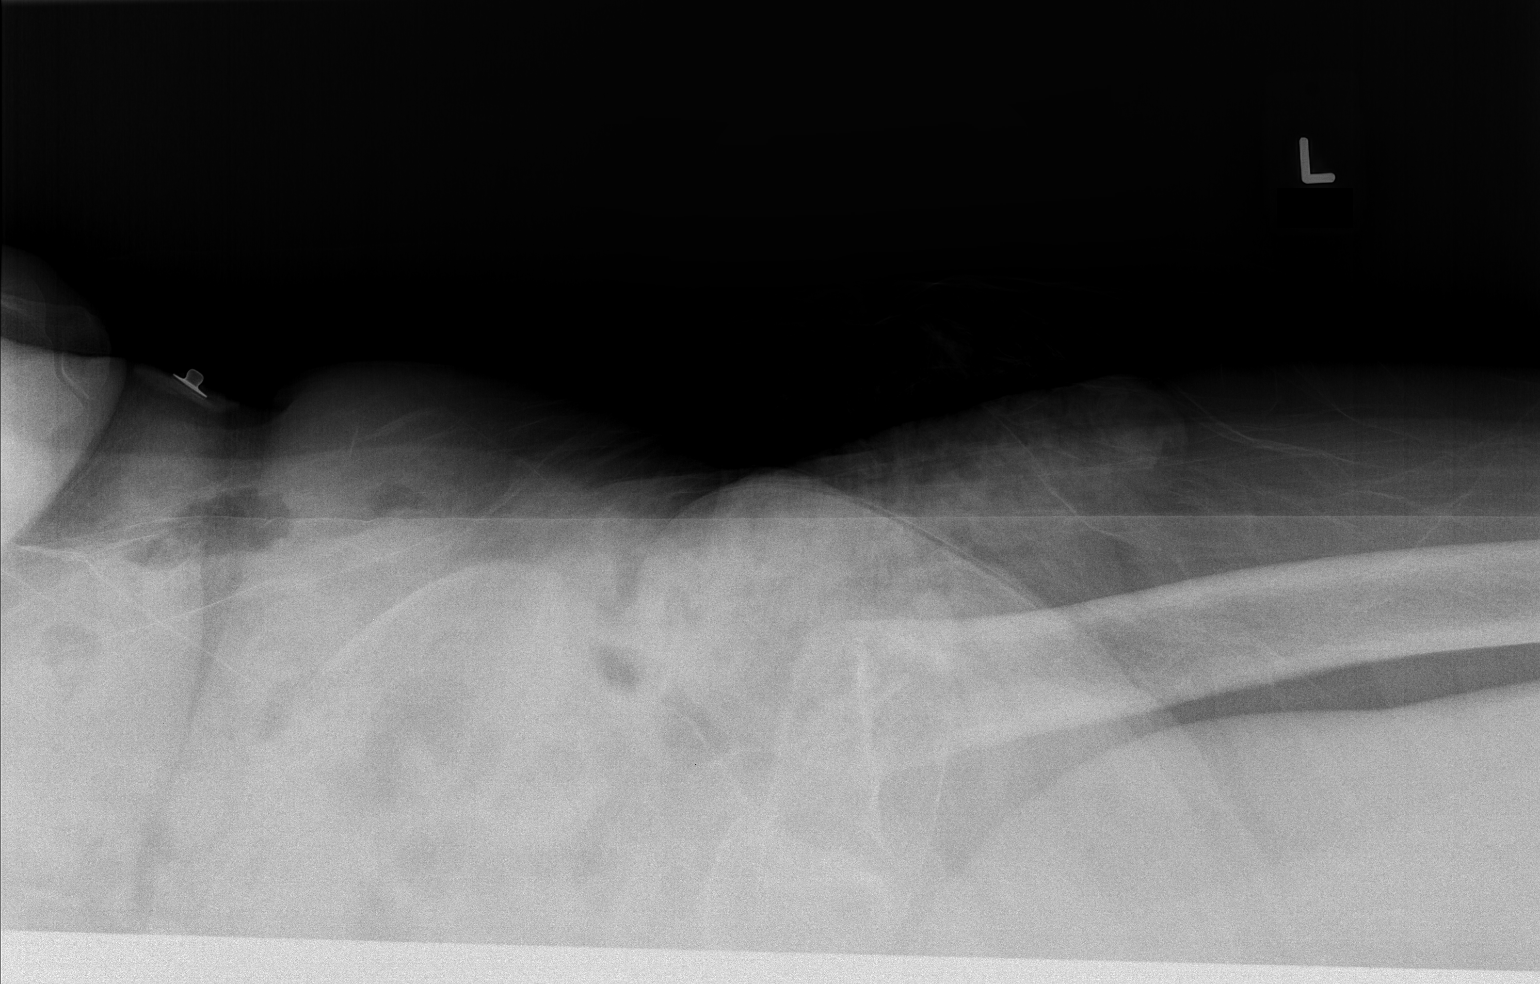

[1 of 1 positions shown; findings below may reference images not displayed]

FINDINGS: Displaced intertrochanteric fracture left femur with varus
angulation.

No dislocation.

Narrowing of left hip joint.

Remainder of left femur appears intact with knee joint alignment
normal.

Visualized left pelvis intact.
IMPRESSION: Displaced angulated intertrochanteric fracture left femur.

Osseous demineralization.

Degenerative changes left hip joint.

## 2014-03-23 IMAGING — CR DG PORTABLE PELVIS
1 series · 1 of 1 positions shown · non-contrast
Comparison: February 08, 2013

CLINICAL DATA: Left hip fracture

EXAM:
PORTABLE PELVIS 1-2 VIEWS

[AP]
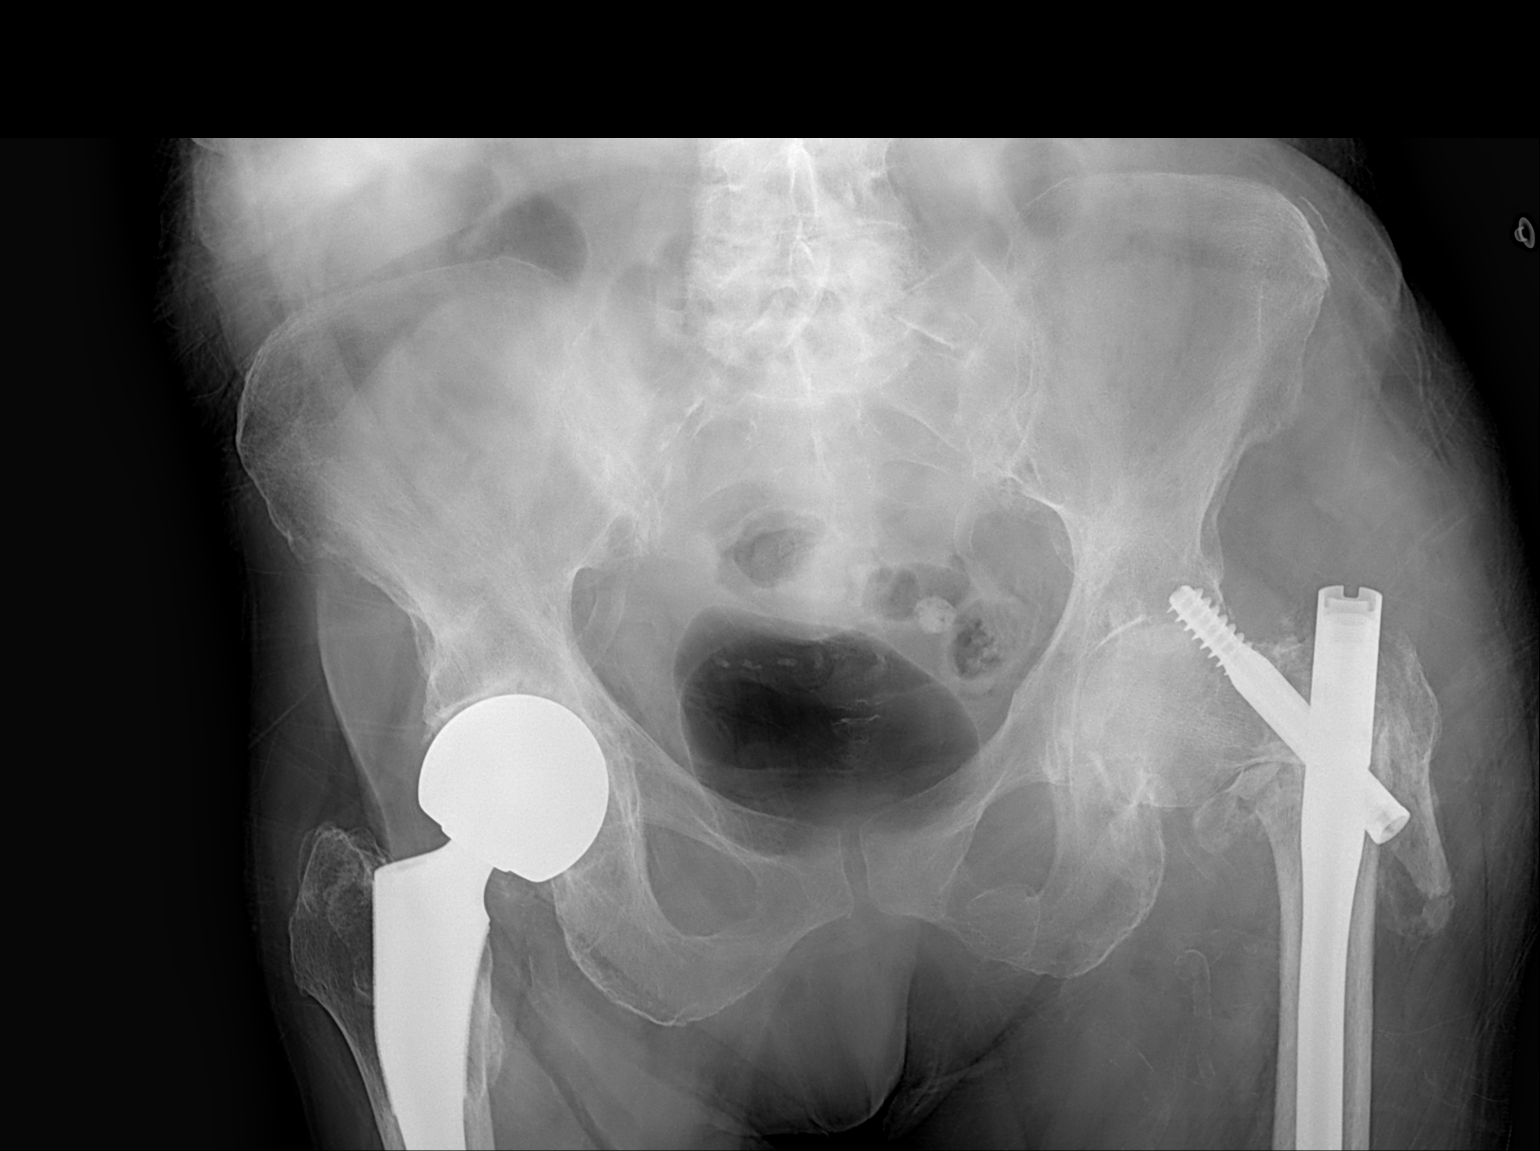

[1 of 1 positions shown; findings below may reference images not displayed]

FINDINGS: There is right femoral hemiarthroplasty in good position. The
patient has comminuted marked displaced fracture intertrochanteric
left femur which was seen on prior x-ray. This has been fixated with
a left femoral rod. However, the proximal component of the left
femoral rod projects beyond the left femoral neck. There is lucency
surrounding the rod at the lateral intertrochanteric region
suggesting loosening.
IMPRESSION: The patient has comminuted marked displaced fracture
intertrochanteric left femur which was seen on prior x-ray. This has
been fixated with a left femoral rod. However, the proximal
component of the left femoral rod projects beyond the left femoral
neck. There is lucency surrounding the rod at the lateral
intertrochanteric region suggesting loosening.
